# Patient Record
Sex: Female | Born: 1935 | Race: White | Hispanic: No | Marital: Married | Smoking: Former smoker
Health system: Southern US, Community
[De-identification: ages and names within clinical notes are randomized; demographics above are authoritative.]

## PROBLEM LIST (undated history)

## (undated) DIAGNOSIS — A6920 Lyme disease, unspecified: Secondary | ICD-10-CM

## (undated) DIAGNOSIS — M1990 Unspecified osteoarthritis, unspecified site: Secondary | ICD-10-CM

## (undated) DIAGNOSIS — E785 Hyperlipidemia, unspecified: Secondary | ICD-10-CM

## (undated) DIAGNOSIS — M75100 Unspecified rotator cuff tear or rupture of unspecified shoulder, not specified as traumatic: Secondary | ICD-10-CM

## (undated) DIAGNOSIS — I10 Essential (primary) hypertension: Secondary | ICD-10-CM

## (undated) DIAGNOSIS — K649 Unspecified hemorrhoids: Secondary | ICD-10-CM

## (undated) DIAGNOSIS — R55 Syncope and collapse: Secondary | ICD-10-CM

## (undated) DIAGNOSIS — E079 Disorder of thyroid, unspecified: Secondary | ICD-10-CM

## (undated) DIAGNOSIS — R634 Abnormal weight loss: Secondary | ICD-10-CM

## (undated) DIAGNOSIS — N83209 Unspecified ovarian cyst, unspecified side: Secondary | ICD-10-CM

## (undated) HISTORY — DX: Unspecified rotator cuff tear or rupture of unspecified shoulder, not specified as traumatic: M75.100

## (undated) HISTORY — DX: Unspecified osteoarthritis, unspecified site: M19.90

## (undated) HISTORY — PX: TONSILLECTOMY: SUR1361

## (undated) HISTORY — PX: OTHER SURGICAL HISTORY: SHX169

## (undated) HISTORY — DX: Unspecified hemorrhoids: K64.9

## (undated) HISTORY — DX: Syncope and collapse: R55

## (undated) HISTORY — DX: Unspecified ovarian cyst, unspecified side: N83.209

## (undated) HISTORY — DX: Hyperlipidemia, unspecified: E78.5

## (undated) HISTORY — DX: Lyme disease, unspecified: A69.20

## (undated) HISTORY — DX: Disorder of thyroid, unspecified: E07.9

## (undated) HISTORY — PX: KNEE ARTHROSCOPY: SHX127

## (undated) HISTORY — DX: Abnormal weight loss: R63.4

## (undated) HISTORY — DX: Essential (primary) hypertension: I10

---
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
## 1968-06-15 HISTORY — PX: ABDOMINAL HYSTERECTOMY: SHX81

## 1987-06-16 HISTORY — PX: ROTATOR CUFF REPAIR: SHX139

## 1999-04-18 ENCOUNTER — Encounter: Payer: Self-pay | Admitting: Specialist

## 1999-04-18 ENCOUNTER — Encounter: Admission: RE | Admit: 1999-04-18 | Discharge: 1999-04-18 | Payer: Self-pay | Admitting: Specialist

## 2003-06-28 ENCOUNTER — Encounter: Admission: RE | Admit: 2003-06-28 | Discharge: 2003-06-28 | Payer: Self-pay | Admitting: Otolaryngology

## 2003-07-13 ENCOUNTER — Encounter (INDEPENDENT_AMBULATORY_CARE_PROVIDER_SITE_OTHER): Payer: Self-pay | Admitting: *Deleted

## 2003-07-13 ENCOUNTER — Ambulatory Visit (HOSPITAL_COMMUNITY): Admission: RE | Admit: 2003-07-13 | Discharge: 2003-07-13 | Payer: Self-pay | Admitting: Otolaryngology

## 2003-07-13 ENCOUNTER — Ambulatory Visit (HOSPITAL_BASED_OUTPATIENT_CLINIC_OR_DEPARTMENT_OTHER): Admission: RE | Admit: 2003-07-13 | Discharge: 2003-07-13 | Payer: Self-pay | Admitting: Otolaryngology

## 2005-06-21 ENCOUNTER — Emergency Department (HOSPITAL_COMMUNITY): Admission: EM | Admit: 2005-06-21 | Discharge: 2005-06-21 | Payer: Self-pay | Admitting: Emergency Medicine

## 2007-06-29 ENCOUNTER — Emergency Department (HOSPITAL_COMMUNITY): Admission: EM | Admit: 2007-06-29 | Discharge: 2007-06-29 | Payer: Self-pay | Admitting: Emergency Medicine

## 2007-12-15 ENCOUNTER — Ambulatory Visit: Payer: Self-pay | Admitting: Oncology

## 2008-01-12 LAB — CBC WITH DIFFERENTIAL/PLATELET
BASO%: 0.6 % (ref 0.0–2.0)
LYMPH%: 29.7 % (ref 14.0–48.0)
MCHC: 35.1 g/dL (ref 32.0–36.0)
MONO#: 0.4 10*3/uL (ref 0.1–0.9)
MONO%: 8.1 % (ref 0.0–13.0)
Platelets: 204 10*3/uL (ref 145–400)
RBC: 3.61 10*6/uL — ABNORMAL LOW (ref 3.70–5.32)
RDW: 12.3 % (ref 11.3–14.5)
WBC: 5.5 10*3/uL (ref 3.9–10.0)

## 2008-01-12 LAB — COMPREHENSIVE METABOLIC PANEL
Albumin: 4.1 g/dL (ref 3.5–5.2)
Alkaline Phosphatase: 59 U/L (ref 39–117)
BUN: 12 mg/dL (ref 6–23)
Calcium: 9.1 mg/dL (ref 8.4–10.5)
Glucose, Bld: 109 mg/dL — ABNORMAL HIGH (ref 70–99)
Sodium: 137 mEq/L (ref 135–145)
Total Bilirubin: 0.7 mg/dL (ref 0.3–1.2)
Total Protein: 6.4 g/dL (ref 6.0–8.3)

## 2008-01-12 LAB — APTT: aPTT: 27 seconds (ref 24–37)

## 2008-01-12 LAB — PROTHROMBIN TIME: Prothrombin Time: 12.8 seconds (ref 11.6–15.2)

## 2009-05-08 ENCOUNTER — Encounter: Admission: RE | Admit: 2009-05-08 | Discharge: 2009-05-08 | Payer: Self-pay | Admitting: Family Medicine

## 2010-10-17 ENCOUNTER — Encounter (INDEPENDENT_AMBULATORY_CARE_PROVIDER_SITE_OTHER): Payer: Self-pay | Admitting: Surgery

## 2010-10-31 NOTE — Op Note (Signed)
NAME:  TAMEE, BATTIN                          ACCOUNT NO.:  [id number]   MEDICAL RECORD NO.:  [id number]                   PATIENT TYPE:  AMB   LOCATION:  DSC                                  FACILITY:  [facility]   PHYSICIAN:  Kristine Garbe. Ezzard Standing, M.D.         DATE OF BIRTH:  08-25-1928   DATE OF PROCEDURE:  07/13/2003  DATE OF DISCHARGE:                                 OPERATIVE REPORT   PREOPERATIVE DIAGNOSIS:  Left posterior neck mass 2 by 3.5 cm size.   POSTOPERATIVE DIAGNOSIS:  Left posterior neck mass 2 by 3.5 cm size.   OPERATION:  Excision of posterior left neck mass (grossly consistent with  lipoma).   SURGEON:  Kristine Garbe. Ezzard Standing, M.D.   ANESTHESIA:  Local 1% Xylocaine with 1:100,000 epinephrine.   COMPLICATIONS:  None.   BRIEF CLINICAL NOTE:  Anglea Gordner is a 77 year old female who has noted a  nodule in the left posterior neck now for about ten years.  More recently,  she felt like it had gotten a little bit larger and is taken to the  operating room at this time for excision of a left posterior neck mass.  It  measures approximately 2 by 3.5 cm size.   DESCRIPTION OF PROCEDURE:  The patient was brought to the operating room.  Her neck was prepped with Betadine solution and the area of the mass was  injected with Xylocaine with epinephrine for local anesthetic.  The area was  draped out and an incision was made directly over the mass.  Dissection was  carried down through the subcutaneous tissue to the mass which was  consistent with probable lipoma.  This was excised from the deep musculature  and sent to pathology.  Hemostasis was obtained with the cautery and the  defect was closed with 3-0 chromic sutures subcutaneously and running 5-0  nylon on the skin.  Dressing was applied.  The patient tolerated the  procedure well.   DISPOSITION:  Vyla is discharged home.  She was instructed to leave the  dressing dry and intact for 48 hours.  She may then remove  the dressing.  We  will have her follow up in my office in 6-7 days for for recheck, to review  pathology, and have sutures removed.                                               Kristine Garbe. Ezzard Standing, M.D.    CEN/MEDQ  D:  07/13/2003  T:  07/13/2003  Job:  [id number]   cc:   Windle Guard, M.D.  [address]  [place], [place] [address]  Fax: [phone]

## 2010-12-22 ENCOUNTER — Other Ambulatory Visit: Payer: Self-pay | Admitting: Family Medicine

## 2010-12-22 DIAGNOSIS — M545 Low back pain: Secondary | ICD-10-CM

## 2010-12-23 ENCOUNTER — Ambulatory Visit
Admission: RE | Admit: 2010-12-23 | Discharge: 2010-12-23 | Disposition: A | Discharge status: Home / Self Care | Payer: Medicare Other | Source: Ambulatory Visit | Attending: Family Medicine | Admitting: Family Medicine

## 2010-12-23 DIAGNOSIS — M545 Low back pain: Secondary | ICD-10-CM

## 2011-01-20 ENCOUNTER — Emergency Department (HOSPITAL_COMMUNITY)
Admission: EM | Admit: 2011-01-20 | Discharge: 2011-01-21 | Disposition: A | Discharge status: Home / Self Care | Payer: Medicare Other | Attending: Emergency Medicine | Admitting: Emergency Medicine

## 2011-01-20 ENCOUNTER — Emergency Department (HOSPITAL_COMMUNITY): Payer: Medicare Other

## 2011-01-20 DIAGNOSIS — E871 Hypo-osmolality and hyponatremia: Secondary | ICD-10-CM | POA: Insufficient documentation

## 2011-01-20 DIAGNOSIS — I10 Essential (primary) hypertension: Secondary | ICD-10-CM | POA: Insufficient documentation

## 2011-01-20 DIAGNOSIS — R1013 Epigastric pain: Secondary | ICD-10-CM | POA: Insufficient documentation

## 2011-01-20 DIAGNOSIS — K2970 Gastritis, unspecified, without bleeding: Secondary | ICD-10-CM | POA: Insufficient documentation

## 2011-01-20 DIAGNOSIS — E039 Hypothyroidism, unspecified: Secondary | ICD-10-CM | POA: Insufficient documentation

## 2011-01-20 LAB — COMPREHENSIVE METABOLIC PANEL
Albumin: 4 g/dL (ref 3.5–5.2)
Alkaline Phosphatase: 66 U/L (ref 39–117)
BUN: 16 mg/dL (ref 6–23)
Calcium: 10.1 mg/dL (ref 8.4–10.5)
Creatinine, Ser: 0.85 mg/dL (ref 0.50–1.10)
GFR calc Af Amer: >60 mL/min (ref >60)
Glucose, Bld: 83 mg/dL (ref 70–99)
Total Protein: 7.3 g/dL (ref 6.0–8.3)

## 2011-01-20 LAB — URINALYSIS, ROUTINE W REFLEX MICROSCOPIC
Glucose, UA: NEGATIVE mg/dL
Leukocytes, UA: NEGATIVE
Nitrite: NEGATIVE
Specific Gravity, Urine: 1.02 (ref 1.005–1.030)
pH: 5.5 (ref 5.0–8.0)

## 2011-01-20 LAB — DIFFERENTIAL
Basophils Relative: 0 % (ref 0–1)
Eosinophils Absolute: 0 10*3/uL (ref 0.0–0.7)
Lymphs Abs: 1.6 10*3/uL (ref 0.7–4.0)
Monocytes Relative: 9 % (ref 3–12)
Neutro Abs: 5.1 10*3/uL (ref 1.7–7.7)
Neutrophils Relative %: 69 % (ref 43–77)

## 2011-01-20 LAB — CBC
Hemoglobin: 13.7 g/dL (ref 12.0–15.0)
MCH: 32 pg (ref 26.0–34.0)
Platelets: 224 10*3/uL (ref 150–400)
RBC: 4.28 MIL/uL (ref 3.87–5.11)
WBC: 7.4 10*3/uL (ref 4.0–10.5)

## 2011-01-20 LAB — URINE MICROSCOPIC-ADD ON

## 2011-01-20 LAB — LACTIC ACID, PLASMA: Lactic Acid, Venous: 1 mmol/L (ref 0.5–2.2)

## 2011-01-20 LAB — TROPONIN I: Troponin I: <0.3 ng/mL (ref <0.30)

## 2011-01-20 LAB — LIPASE, BLOOD: Lipase: 26 U/L (ref 11–59)

## 2011-01-23 ENCOUNTER — Other Ambulatory Visit: Payer: Self-pay | Admitting: Gastroenterology

## 2011-01-23 DIAGNOSIS — R1013 Epigastric pain: Secondary | ICD-10-CM

## 2011-01-23 DIAGNOSIS — R110 Nausea: Secondary | ICD-10-CM

## 2011-02-06 ENCOUNTER — Other Ambulatory Visit: Payer: Self-pay | Admitting: Gastroenterology

## 2011-02-06 ENCOUNTER — Ambulatory Visit
Admission: RE | Admit: 2011-02-06 | Discharge: 2011-02-06 | Disposition: A | Discharge status: Home / Self Care | Payer: Medicare Other | Source: Ambulatory Visit | Attending: Gastroenterology | Admitting: Gastroenterology

## 2011-02-06 DIAGNOSIS — R110 Nausea: Secondary | ICD-10-CM

## 2011-02-06 DIAGNOSIS — R1013 Epigastric pain: Secondary | ICD-10-CM

## 2011-02-06 DIAGNOSIS — K317 Polyp of stomach and duodenum: Secondary | ICD-10-CM

## 2011-02-10 ENCOUNTER — Ambulatory Visit
Admission: RE | Admit: 2011-02-10 | Discharge: 2011-02-10 | Disposition: A | Discharge status: Home / Self Care | Payer: Medicare Other | Source: Ambulatory Visit | Attending: Gastroenterology | Admitting: Gastroenterology

## 2011-02-10 DIAGNOSIS — K317 Polyp of stomach and duodenum: Secondary | ICD-10-CM

## 2011-02-10 MED ORDER — IOHEXOL 300 MG/ML  SOLN: 100.0000 mL | Freq: Once | INTRAMUSCULAR | Status: AC | PRN

## 2011-02-12 ENCOUNTER — Encounter (INDEPENDENT_AMBULATORY_CARE_PROVIDER_SITE_OTHER): Payer: Self-pay | Admitting: General Surgery

## 2011-02-12 ENCOUNTER — Ambulatory Visit (INDEPENDENT_AMBULATORY_CARE_PROVIDER_SITE_OTHER): Payer: Medicare Other | Admitting: General Surgery

## 2011-02-12 VITALS — BP 128/70 | HR 68 | Temp 97.4°F | Ht 66.0 in | Wt 132.2 lb

## 2011-02-12 DIAGNOSIS — R630 Anorexia: Secondary | ICD-10-CM

## 2011-02-12 DIAGNOSIS — R1013 Epigastric pain: Secondary | ICD-10-CM

## 2011-02-12 DIAGNOSIS — M549 Dorsalgia, unspecified: Secondary | ICD-10-CM

## 2011-02-12 NOTE — Progress Notes (Signed)
Subjective:   Loss of appetite  Patient ID: Morgan Weaver, female   DOB: 06-Jun-1929, 77 y.o.   MRN: [id number]  HPI The patient is a pleasant 77 year old female patient of Dr. Jeannetta Nap referred through Dr. Randa Evens for evaluation for abdominal pain, back pain, loss of appetite. She states that 3-4 weeks ago she initially developed back pain. She describes pain in her mid back somewhat more on the left than the right. This was her only complaint initially. She saw Dr. Jeannetta Nap. She was evaluated for a kidney problem with an x-ray and lab work and this was all unremarkable. Then approximately 2 weeks ago she developed fairly severe acute epigastric pain and presented to the emergency room. This was her first and only episode of abdominal pain. Lab work including CBC lipase seem at a cardiac workup were all unremarkable. She was given what sounds like a GI cocktail and her abdominal pain resolved.  The patient however continues to have persistent lack of appetite. She eats small amounts but just does not feel hungry. She is having weight loss. She has lost about 15 pounds. She denies pain when she eats or nausea or vomiting. She has had no further anterior abdominal pain since her ER visit. She has been somewhat constipated. Her main complaint other than lack of appetite is now persistent pain in her left back beneath the rib cage. This is more positional than anything else and is unaffected by eating or not eating.  The patient has seen Dr. Randa Evens for evaluation. She underwent EGD. This showed localized mild inflammation in the gastric antrum. The patient had been on Loxitane #4 knee pain which was stopped. Biopsies showed nonspecific inflammation. He also noted an apparent small approximately 1 cm pedunculated polyp in the duodenum which I do not believe was removed.   CT scan of the abdomen and pelvis was obtained on 828. This shows multiple hepatic cysts. Also seen was a 2.2 cm rounded fat density mass in the  second portion of the duodenum with no evidence of obstruction. This is felt most likely to be a lipoma.  Ultrasound of the abdomen was performed on 824 which showed sludge within the gallbladder lumen with no discrete gallstones. There was no evidence of cholecystitis. Review of Systems  Constitutional: Positive for appetite change and unexpected weight change.  HENT: Negative.   Eyes: Negative.   Respiratory: Negative.   Cardiovascular: Negative.   Gastrointestinal: Positive for constipation. Negative for nausea, blood in stool and abdominal distention.  Musculoskeletal: Positive for back pain and arthralgias.       Objective:   Physical Exam General: Thin elderly white female in no distress Skin: Warm and dry without rash or infection HEENT: No palpable masses or thyromegaly. Sclera nonicteric. Lymph nodes: No palpable cervical or supraclavicular or inguinal lymph nodes Lungs: Clear without wheezing or increased work of breathing Cardiovascular: Regular rate and rhythm without murmur. No edema. Abdomen: No CVA tenderness. There is mild left upper quadrant tenderness with no guarding. No right upper quadrant tenderness. No discernible masses or organomegaly. Extremities: No joint swelling or deformity Neurologic: Alert and fully oriented. Gait normal.    Assessment:     77 year old female with a somewhat confusing illness consisting of initially back pain than one episode of acute epigastric pain that has resolved and not recurred. Her main complaint now is lack of appetite without other GI symptoms and persistent discomfort in the left side of her back. She does have some evidence of  gastritis on endoscopy possibly secondary to nonsteroidal anti-inflammatories. She has an apparent lipoma in her duodenum but I do not see how this could be contributing to her symptoms. She has sludge in her gallbladder but no definite stones and she has no ongoing abdominal pain or tenderness in the  right upper quadrant.  At this point I think she needs to be treated for gastritis and observed. She has an appointment to see Dr. Randa Evens in 2 weeks. I have started her on Carafate and protonix. If she does not respond to symptomatic management we could further evaluate her gallbladder with a HIDA scan but her symptoms and exam are very atypical.    Plan:     Patient is to followup with Dr. Randa Evens in 2 weeks. I will discuss with him at that time how she is doing. Consider further gallbladder workup. I do not see a surgical indication at this time.

## 2011-02-12 NOTE — Patient Instructions (Signed)
Keep appointment with Dr. Randa Evens. A few or not improving at that time we could consider further evaluation of her gallbladder with a HIDA scan. Call for questions.

## 2011-03-05 LAB — COMPREHENSIVE METABOLIC PANEL
ALT: 11
AST: 17
Albumin: 3.4 — ABNORMAL LOW
CO2: 26
Calcium: 8.8
Chloride: 107
GFR calc Af Amer: >60
GFR calc non Af Amer: 57 — ABNORMAL LOW
Sodium: 139
Total Bilirubin: 0.9

## 2011-03-05 LAB — DIFFERENTIAL
Eosinophils Absolute: 0.1
Eosinophils Relative: 1
Lymphs Abs: 1.3
Monocytes Absolute: 0.4

## 2011-03-05 LAB — CBC
RBC: 3.81 — ABNORMAL LOW
WBC: 4.5

## 2011-03-05 LAB — URINALYSIS, ROUTINE W REFLEX MICROSCOPIC
Nitrite: NEGATIVE
Specific Gravity, Urine: 1.008
Urobilinogen, UA: 0.2

## 2011-03-31 ENCOUNTER — Encounter (INDEPENDENT_AMBULATORY_CARE_PROVIDER_SITE_OTHER): Payer: Self-pay | Admitting: Surgery

## 2011-04-14 ENCOUNTER — Encounter (INDEPENDENT_AMBULATORY_CARE_PROVIDER_SITE_OTHER): Payer: Self-pay | Admitting: Surgery

## 2011-04-16 ENCOUNTER — Encounter (INDEPENDENT_AMBULATORY_CARE_PROVIDER_SITE_OTHER): Payer: Self-pay | Admitting: Surgery

## 2011-04-16 ENCOUNTER — Ambulatory Visit (INDEPENDENT_AMBULATORY_CARE_PROVIDER_SITE_OTHER): Payer: Medicare Other | Admitting: Surgery

## 2011-04-16 VITALS — BP 125/81 | HR 71 | Temp 97.1°F | Resp 14 | Ht 64.5 in | Wt 132.2 lb

## 2011-04-16 DIAGNOSIS — L290 Pruritus ani: Secondary | ICD-10-CM | POA: Insufficient documentation

## 2011-04-16 NOTE — Patient Instructions (Signed)
Use baby wipes after bowel movements to clean yourself.  Then get the area as dry as possible, then apply a small amount of 1% hydrocortisone ointment to the itchy area.    Use a daily fiber supplement to keep your bowel movements regular.  Follow-up as needed

## 2011-04-16 NOTE — Progress Notes (Signed)
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  Recently, she has had some digestive issues related to her back and the pain medication.  Her bowel movements continue occur daily, but she has developed a lot of burning and itching around her anus.  No gross blood.  She returns today for further evaluation.  On rectal examination, she has no significant external hemorrhoidal disease.  No sign of fissure or abscess. She has some excoriation in the posterior midline with obvious skin irritation.  This is the area of her symptoms.  Sphincter tone is good.  Imp:  Pruritus ani  Recs:  Fiber supplement to regulate bowel movements. Use baby wipes to clean herself after bowel movements. Dry the perianal skin thoroughly, then apply a small amount of 1% hydrocortisone ointment to the affected area.  Follow-up PRN

## 2011-08-27 ENCOUNTER — Encounter: Payer: Self-pay | Admitting: *Deleted

## 2012-11-16 ENCOUNTER — Encounter (HOSPITAL_COMMUNITY): Payer: Self-pay | Admitting: *Deleted

## 2012-11-16 ENCOUNTER — Inpatient Hospital Stay (HOSPITAL_COMMUNITY)
Admission: EM | Admit: 2012-11-16 | Discharge: 2012-11-18 | DRG: 312 | Disposition: A | Discharge status: Home Health | Payer: Medicare Other | Attending: Internal Medicine | Admitting: Internal Medicine

## 2012-11-16 ENCOUNTER — Emergency Department (HOSPITAL_COMMUNITY): Payer: Medicare Other

## 2012-11-16 DIAGNOSIS — E039 Hypothyroidism, unspecified: Secondary | ICD-10-CM | POA: Diagnosis present

## 2012-11-16 DIAGNOSIS — D61818 Other pancytopenia: Secondary | ICD-10-CM | POA: Diagnosis present

## 2012-11-16 DIAGNOSIS — R634 Abnormal weight loss: Secondary | ICD-10-CM | POA: Diagnosis present

## 2012-11-16 DIAGNOSIS — Y92009 Unspecified place in unspecified non-institutional (private) residence as the place of occurrence of the external cause: Secondary | ICD-10-CM

## 2012-11-16 DIAGNOSIS — N390 Urinary tract infection, site not specified: Secondary | ICD-10-CM | POA: Diagnosis present

## 2012-11-16 DIAGNOSIS — R55 Syncope and collapse: Principal | ICD-10-CM | POA: Diagnosis present

## 2012-11-16 DIAGNOSIS — R296 Repeated falls: Secondary | ICD-10-CM | POA: Diagnosis present

## 2012-11-16 DIAGNOSIS — E876 Hypokalemia: Secondary | ICD-10-CM | POA: Diagnosis present

## 2012-11-16 DIAGNOSIS — D72819 Decreased white blood cell count, unspecified: Secondary | ICD-10-CM | POA: Diagnosis present

## 2012-11-16 DIAGNOSIS — Z9089 Acquired absence of other organs: Secondary | ICD-10-CM

## 2012-11-16 DIAGNOSIS — Z881 Allergy status to other antibiotic agents status: Secondary | ICD-10-CM

## 2012-11-16 DIAGNOSIS — I10 Essential (primary) hypertension: Secondary | ICD-10-CM | POA: Diagnosis present

## 2012-11-16 DIAGNOSIS — Z882 Allergy status to sulfonamides status: Secondary | ICD-10-CM

## 2012-11-16 DIAGNOSIS — S0100XA Unspecified open wound of scalp, initial encounter: Secondary | ICD-10-CM | POA: Diagnosis present

## 2012-11-16 DIAGNOSIS — Z8249 Family history of ischemic heart disease and other diseases of the circulatory system: Secondary | ICD-10-CM

## 2012-11-16 DIAGNOSIS — Z87891 Personal history of nicotine dependence: Secondary | ICD-10-CM

## 2012-11-16 DIAGNOSIS — Z79899 Other long term (current) drug therapy: Secondary | ICD-10-CM

## 2012-11-16 DIAGNOSIS — E785 Hyperlipidemia, unspecified: Secondary | ICD-10-CM | POA: Diagnosis present

## 2012-11-16 DIAGNOSIS — D696 Thrombocytopenia, unspecified: Secondary | ICD-10-CM | POA: Diagnosis present

## 2012-11-16 DIAGNOSIS — R21 Rash and other nonspecific skin eruption: Secondary | ICD-10-CM | POA: Diagnosis present

## 2012-11-16 LAB — CBC
MCHC: 35.6 g/dL (ref 30.0–36.0)
MCV: 92.7 fL (ref 78.0–100.0)
Platelets: 83 10*3/uL — ABNORMAL LOW (ref 150–400)
RDW: 12.8 % (ref 11.5–15.5)
WBC: 3.3 10*3/uL — ABNORMAL LOW (ref 4.0–10.5)

## 2012-11-16 LAB — DIFFERENTIAL
Basophils Absolute: 0 10*3/uL (ref 0.0–0.1)
Basophils Relative: 1 % (ref 0–1)
Eosinophils Relative: 0 % (ref 0–5)
Lymphocytes Relative: 13 % (ref 12–46)
Neutro Abs: 2.5 10*3/uL (ref 1.7–7.7)

## 2012-11-16 LAB — BASIC METABOLIC PANEL
BUN: 14 mg/dL (ref 6–23)
Calcium: 7.6 mg/dL — ABNORMAL LOW (ref 8.4–10.5)
Chloride: 100 mEq/L (ref 96–112)
Creatinine, Ser: 1.09 mg/dL (ref 0.50–1.10)
GFR calc Af Amer: 53 mL/min — ABNORMAL LOW (ref >90)
GFR calc non Af Amer: 46 mL/min — ABNORMAL LOW (ref >90)

## 2012-11-16 LAB — URINALYSIS, ROUTINE W REFLEX MICROSCOPIC
Ketones, ur: 15 mg/dL — AB
Nitrite: NEGATIVE
Protein, ur: 30 mg/dL — AB
Urobilinogen, UA: 1 mg/dL (ref 0.0–1.0)

## 2012-11-16 LAB — TECHNOLOGIST SMEAR REVIEW: Tech Review: INCREASED

## 2012-11-16 LAB — MAGNESIUM: Magnesium: 1.7 mg/dL (ref 1.5–2.5)

## 2012-11-16 MED ORDER — POTASSIUM CHLORIDE CRYS ER 20 MEQ PO TBCR
40.0000 meq | EXTENDED_RELEASE_TABLET | Freq: Once | ORAL | Status: AC
  Administered 2012-11-16: 40 meq via ORAL
  Filled 2012-11-16: qty 2

## 2012-11-16 MED ORDER — LEVOTHYROXINE SODIUM 125 MCG PO TABS
125.0000 ug | ORAL_TABLET | Freq: Every day | ORAL | Status: DC
  Administered 2012-11-17 – 2012-11-18 (×2): 125 ug via ORAL
  Filled 2012-11-16 (×4): qty 1

## 2012-11-16 MED ORDER — SODIUM CHLORIDE 0.9 % IV SOLN
INTRAVENOUS | Status: DC
  Administered 2012-11-16 – 2012-11-17 (×2): via INTRAVENOUS

## 2012-11-16 MED ORDER — DEXTROSE 5 % IV SOLN
1.0000 g | INTRAVENOUS | Status: DC
  Administered 2012-11-16 – 2012-11-17 (×2): 1 g via INTRAVENOUS
  Filled 2012-11-16 (×2): qty 10

## 2012-11-16 MED ORDER — ONDANSETRON HCL 4 MG PO TABS: 4.0000 mg | ORAL_TABLET | Freq: Four times a day (QID) | ORAL | Status: DC | PRN

## 2012-11-16 MED ORDER — DIPHENHYDRAMINE HCL 25 MG PO CAPS: 25.0000 mg | ORAL_CAPSULE | Freq: Four times a day (QID) | ORAL | Status: DC | PRN

## 2012-11-16 MED ORDER — ONDANSETRON HCL 4 MG/2ML IJ SOLN: 4.0000 mg | Freq: Four times a day (QID) | INTRAMUSCULAR | Status: DC | PRN

## 2012-11-16 MED ORDER — SODIUM CHLORIDE 0.9 % IJ SOLN
3.0000 mL | Freq: Two times a day (BID) | INTRAMUSCULAR | Status: DC
  Administered 2012-11-16 – 2012-11-18 (×4): 3 mL via INTRAVENOUS

## 2012-11-16 MED ORDER — SODIUM CHLORIDE 0.9 % IV BOLUS (SEPSIS)
500.0000 mL | Freq: Once | INTRAVENOUS | Status: AC
  Administered 2012-11-16: 500 mL via INTRAVENOUS

## 2012-11-16 MED ORDER — ENOXAPARIN SODIUM 40 MG/0.4ML ~~LOC~~ SOLN
40.0000 mg | SUBCUTANEOUS | Status: DC
  Administered 2012-11-16: 40 mg via SUBCUTANEOUS
  Filled 2012-11-16 (×2): qty 0.4

## 2012-11-16 NOTE — Progress Notes (Signed)
Per Dr. Shirlee Latch, patient is NOT being admitted for CVA workup.

## 2012-11-16 NOTE — H&P (Signed)
Hospital Admission Note Date: 11/16/2012  Patient name: Morgan Weaver Medical record number: [id number] Date of birth: [birth year] Age: 79 y.o. Gender: female PCP: Leonard Downing, MD  Medical Service: Internal Medicine  Attending physician: Dr. Marinda Elk     1st Contact:[place] (11/16/12 only), Sissy Hoff MD [phone], [phone] 2nd Contact: Dr. Doug Sou [phone] After 5 pm or weekends: 1st Contact: Pager: [phone] 2nd Contact: Pager: [phone]  Chief Complaint: syncope   History of Present Illness: 79 year old female past medical history of hypertension, dyslipidemia, hypothyroidism.  She presented to emergency department 11/16/12 with history of two episodes of syncope over the last 2 days. Both episodes the patient lost consciousness and states she was trying to stand up after using the restroom and after using the restroom she had loss of consciousness and ended up on the floor.  On 11/15/12 she went to the local fire department who examined her and she was supposed to increase her oral intake.  Yesterday's syncopal episode was associated with nausea and decreased oral intake.  On 11/16/12 her son Pearline Cables found her on the bathroom floor where she had been out for about approximately at least 5 minutes.  He applied a wet towel to her face.  She also hit her head at some point and had a right scalp laceration.  Both sons state the patient is independent at home is is not acting differently after her fall.  Review of symptoms positive for decreased appetite 6/3 and 6/4, wound to right scalp from fall, fall, resolved nausea on 11/15/12.  Negative for palpitations, chest pain, orthopnea, shortness of breath, vomiting, abdominal pain/diarrhea, dysuria, blood in urine or stool, dizziness or lightheadedness, postural lightheadedness, vertigo,bladder or bowel incontinence.  She has had a rash x 1 week which itches and started on her right forearm.  Rash stated as a bump on her right forearm and now is starting to  spread on both forearms.  She denies history of tick bites and has 1 indoor cat and 5 outdoors cats.    Meds: No current facility-administered medications for this encounter.   Current Outpatient Prescriptions  Medication Sig Dispense Refill  . hydrochlorothiazide 50 MG tablet Take 25 mg by mouth daily.       Marland Kitchen levothyroxine (SYNTHROID, LEVOTHROID) 125 MCG tablet Take 125 mcg by mouth daily before breakfast. Skip 7th day dose      Patient states shes taking 25 mg daily HCTZ  Allergies: Allergies as of 11/16/2012 - Review Complete 11/16/2012  Allergen Reaction Noted  . Mobic (meloxicam)  11/16/2012  . Sulfa drugs cross reactors  10/17/2010  . Tetracyclines & related Swelling 10/17/2010   Past Medical History  Diagnosis Date  . Torn rotator cuff   . Ovarian cyst   . Lyme disease   . Hypertension   . Hemorrhoid   . Arthritis   . Weight decrease   . Hyperlipidemia   . Thyroid disease     hypothyroidism   Past Surgical History  Procedure Laterality Date  . Appendectomy  1952  . Rotator cuff repair  1989    left  . Oophorectomy  1955    removed due to the ovary having gangrene  . Abdominal hysterectomy  1970  . Knee arthroscopy    . Tonsillectomy    . Other surgical history      left foot surgery  . Other surgical history      right foot surgery   Family History  Problem Relation Age of Onset  . Heart disease  Father     heart attack  . Hypertension Father   . Other Son     brain tumor    History   Social History  . Marital Status: Married    Spouse Name: N/A    Number of Children: N/A  . Years of Education: N/A   Occupational History  . Not on file.   Social History Main Topics  . Smoking status: Former Research scientist (life sciences)  . Smokeless tobacco: Not on file     Comment: quit-1977  . Alcohol Use: 8.4 oz/week    14 Glasses of wine per week     Comment: BEFORE DINNER  . Drug Use: No  . Sexually Active:    Other Topics Concern  . 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           Review of Systems: General: denies fever/chills, per patient normal appetite though son reports she felt nauseated yesterday and did not eat much 6/3 and 6/4  HEENT: +wound to right scalp from fall Cardiac: denies palpitations, chest pain, orthopnea  Pulm: denies shortness of breath Abd: +nauseated (11/15/12), denies vomiting, denies abdominal pain/diarrhea, denies dysuria, denies blood in urine or stool  Ext: denies lower extremity edema  Neuro: +syncope x 2 episodes 6/3 and 6/4 while standing up after using the restroom, +fall, denies dizziness or lightheadedness, denies postural lightheadedness, denies vertigo, denies bladder or bowel incontinence  Skin: +rash x 1 week which itches  Other: denies history of tick bites, Patient has 1 indoor cat and 5 outdoors cats    Physical Exam: VS HR 65, 18, 114/64 (72) as low as 97/52 (66) Blood pressure 114/62, pulse 67, temperature 98.6 F (37 C), temperature source Oral, resp. rate 15, SpO2 98.00%. Vitals reviewed. General: resting in bed, NAD, alert and oriented to place, date of birth, not year thinks it is [birth year] or [birth year], not month, oriented to Software engineer of [place] states, thin body habitus  HEENT: perrl b/l, no scleral icterus, right scalp with laceration due to fall Cardiac: RRR, no rubs, murmurs or gallops Pulm: clear to auscultation bilaterally, no wheezes, rales, or rhonchi Abd: soft, nontender, nondistended, BS present Ext: warm and well perfused, no pedal edema Neuro: alert and oriented X3, grossly neurologically intact, moving all 4 extremities  Skin: macular and papular erythematous annular lesions to forearms which are pruritic and some with a hypopigmented center.  Some areas of rash are coalescing.    Lab  results: Basic Metabolic Panel:  Recent Labs  11/16/12 1222  NA 132*  K 3.2*  CL 100  CO2 23  GLUCOSE 102*  BUN 14  CREATININE 1.09  CALCIUM 7.6*   CBC:  Recent Labs  11/16/12 1222  WBC 3.3*  HGB 12.6  HCT 35.4*  MCV 92.7  PLT 83*   Urinalysis:  Recent Labs  11/16/12 1228  COLORURINE AMBER*  LABSPEC 1.028  PHURINE 5.5  GLUCOSEU NEGATIVE  HGBUR MODERATE*  BILIRUBINUR MODERATE*  KETONESUR 15*  PROTEINUR 30*  UROBILINOGEN 1.0  NITRITE NEGATIVE  LEUKOCYTESUR TRACE*  moderate hemoglobin  3-6 WBC 0-2 RBC  Few squamous epithelial cells  Many bacteria   Misc. Labs: Urine culture  TSH CBC Magnesium CMET Blood cultures  Imaging results:  Ct Head Wo Contrast  11/16/2012   *RADIOLOGY REPORT*  Clinical Data: Syncope.  CT HEAD WITHOUT CONTRAST  Technique:  Contiguous axial images were obtained from the base of the skull through the vertex without contrast.  Comparison: Brain MRI 05/08/2009.  Findings: There is cortical atrophy and chronic microvascular ischemic change.  No evidence of acute abnormality including infarct, hemorrhage, mass lesion, mass effect, midline shift or abnormal extra-axial fluid collection.  No hydrocephalus or pneumocephalus.  The calvarium is intact.  IMPRESSION: No acute finding.  Atrophy and chronic microvascular ischemic change.   Original Report Authenticated By: Orlean Patten, M.D.    Other results: EKG:NSR rate 69, LAD, normal intervals, no acute ST/T changes, poor R wave progression, no LVH  Assessment & Plan by Problem: 79 year old female past medical history of hypertension, dyslipidemia, hypothyroidism who presents after two episodes of syncope at home.   1. Syncope -With LOC for unclear duration both episodes happened after patient was standing up post voiding.  She has not felt well over the past couple of days and has had decreased oral intake.  Etiology could be neurocardiogenic (i.e vasovagal), orthostatic hypotension  due to decreased oral intake with use of hydrochlorothiazide, arrhythmia.  Appears less likely neurologic as no seizure like activity was reported and the patient did not lose bowel or bladder incontinence. She was not hypoglycemia on admission, hypoxic or anemic.  Head CT negative for any acute intracranial abnormality and notes atrophy and chronic microvascular changes. EKG not concerning.  -will monitor via telemetry.  Will call for cardiology consult for syncope -pending 2D echo, troponin x 3, check FOBT -will hydrate with IVF NS 100 cc/hr -will check orthostatic VS  2. Suspected UTI -Denies dysuria.  Will treat with Rx Rocephin and d/c if urine culture negative.   3. Hypokalemia  -3.2 on admission. Given Kdur 40 meQ x 1.  -will check Mag, Trend BMET  4. Leukopenia andThrombocytopenia  -No previous history of low WBCs or low platelets.  Etiology could be infectious (in the history it states patient has a history of lyme disease) or related to sulfa containing drugs (i.e HCTZ), malignancy must be in the differential  -will order a peripheral smear review  -f/u CBC with diff  -consider malignancy work up  5. Rash -Concern for erythema multiforme with distribution and appearance of rash -Will hold HCTZ for now as it can be a cause of erythema multiforme  6. History of HTN -Monitor VS -On HCTZ 25 mg at home.  Will hold HCTZ for now as it can be a cause of erythema multiforme and patient has a rash -if significant hypertensive will need an agent for blood pressure control added.   7. History of hypothyroidism -resumed Synthroid 125 mcg daily  -pending tsh  8. F/E/N -NS 100 cc/hr  -CMET, Mag in the am -regular diet   9. DVT px  -Lovenox   Code Status: full code   Dispo: Disposition is deferred at this time, awaiting improvement of current medical problems. Anticipated discharge in approximately 2-3 day(s).   The patient does have a current PCP Leonard Downing, MD),  therefore will not requiring [facility]PC follow-up after discharge.   The patient does not have transportation limitations that hinder transportation to clinic appointments.  SignedCresenciano Genre [phone] 11/16/2012, 3:30 PM

## 2012-11-16 NOTE — ED Notes (Signed)
Pt returned from radiology.

## 2012-11-16 NOTE — ED Notes (Signed)
Pt was  Found by EMS to be pale and nauseated. Pt received 4mg  of zofran with no relief.

## 2012-11-16 NOTE — ED Notes (Addendum)
Per EMS- pt has had several syncopal episodes over the last 3 days. Pt has had N/V as well. Pt was hypotensive 100/70 initially. Pt was given 100 ml NS en route. Pt on 2L for sats in lower 90%. Pt was found in the restroom this morning by family. Hit head on rt side this morning.

## 2012-11-16 NOTE — H&P (Signed)
Internal Medicine Attending Admission Note Date: 11/16/2012  Patient name: Morgan Weaver Medical record number: [id number] Date of birth: 11-Feb-1929 Age: 79 y.o. Gender: female  I saw and evaluated the patient. I reviewed the resident's note and I agree with the resident's findings and plan as documented in the resident's note, with the following additional comments.  Chief Complaint(s): Syncope  History - key components related to admission: Patient is a 79 year old woman with history of hypertension, hyperlipidemia, hypothyroidism, and other problems as outlined in the medical history admitted with complaint of loss of consciousness x2 episodes over the past 2 days.  The first episode occurred after she stood up from the toilet and she lost consciousness and fell.  His second episode occurred when she was standing at the sink.  She denies any premonitory symptoms.  She also denies fever, chills, sweats, headache, chest pain, shortness of breath, palpitations, abdominal pain, nausea, vomiting, diarrhea, bright red blood per rectum, or lower extremity edema.  She reports that she has had normal oral intake recently.  She has no prior history of syncope or seizures.  She is on no new medications.   Physical Exam - key components related to admission:  Filed Vitals:   11/16/12 1530 11/16/12 1631 11/16/12 1634 11/16/12 1636  BP: 118/56 131/63 147/77 127/75  Pulse:  54 62 68  Temp:  98.5 F (36.9 C) 98.5 F (36.9 C) 98.5 F (36.9 C)  TempSrc:  Oral Oral Oral  Resp:  18 14 18   SpO2: 96% 100% 100% 100%    General: Alert, no distress HEENT: Wound on right scalp secondary to fall Lungs: Clear Heart: Regular; S1-S2, no S3, no S4, no murmurs Abdomen: Bowel sounds present, soft, nontender Extremities: No edema Neurological: Alert and oriented; cranial nerves II through XII intact; strength normal.  Lab results:   Basic Metabolic Panel:  Recent Labs  46/96/29 1222  NA 132*  K 3.2*   CL 100  CO2 23  GLUCOSE 102*  BUN 14  CREATININE 1.09  CALCIUM 7.6*    CBC:  Recent Labs  11/16/12 1222  WBC 3.3*  NEUTROABS 2.5  HGB 12.6  HCT 35.4*  MCV 92.7  PLT 83*    Urinalysis    Component Value Date/Time   COLORURINE AMBER* 11/16/2012 1228   APPEARANCEUR CLOUDY* 11/16/2012 1228   LABSPEC 1.028 11/16/2012 1228   PHURINE 5.5 11/16/2012 1228   GLUCOSEU NEGATIVE 11/16/2012 1228   HGBUR MODERATE* 11/16/2012 1228   BILIRUBINUR MODERATE* 11/16/2012 1228   KETONESUR 15* 11/16/2012 1228   PROTEINUR 30* 11/16/2012 1228   UROBILINOGEN 1.0 11/16/2012 1228   NITRITE NEGATIVE 11/16/2012 1228   LEUKOCYTESUR TRACE* 11/16/2012 1228    Urine microscopic: WBCs 3-6, RBCs 0-2, squamous epithelial few, bacteria many.  Imaging results:  Ct Head Wo Contrast  11/16/2012   *RADIOLOGY REPORT*  Clinical Data: Syncope.  CT HEAD WITHOUT CONTRAST  Technique:  Contiguous axial images were obtained from the base of the skull through the vertex without contrast.  Comparison: Brain MRI 05/08/2009.  Findings: There is cortical atrophy and chronic microvascular ischemic change.  No evidence of acute abnormality including infarct, hemorrhage, mass lesion, mass effect, midline shift or abnormal extra-axial fluid collection.  No hydrocephalus or pneumocephalus.  The calvarium is intact.  IMPRESSION: No acute finding.  Atrophy and chronic microvascular ischemic change.   Original Report Authenticated By: Holley Dexter, M.D.    Assessment & Plan by Problem:  1.  Syncope.  Patient reports 2 episodes  of syncope over the last 2 days, with no premonitory symptoms.  She has no history of syncope or seizures.  Her presentation is concerning for an arrhythmic cause.  The plan is monitor; rule out MI with serial enzymes and EKGs; 2-D echocardiogram; cardiology consult.  2. Hypokalemia.  Plan is to supplement potassium and follow.  3.  Hypothyroidism.  Plan is check TSH and continue current dose of levothyroxine pending the  results.  4.  Other problems and plans as per the resident physician's note.

## 2012-11-16 NOTE — ED Provider Notes (Signed)
History     CSN: [id number]  Arrival date & time 11/16/12  1037   First MD Initiated Contact with Patient 11/16/12 1041      Chief Complaint  Patient presents with  . Loss of Consciousness    (Consider location/radiation/quality/duration/timing/severity/associated sxs/prior treatment) HPI Comments: 79 year old female presents to emergency department with 2 of her sons after having multiple syncopal episodes over the past 2 days. Patient states yesterday she got up out of bed to go to the bathroom, sat on the toilet and the next thing she knew she was on the floor. Her son came home from the store and found her on the floor, completely alert and oriented. She then went back into bed, woke up later that day, went to sit in a chair and shortly after was slouched over, her son came and saw and woke her up. At that time she felt nauseous and tired. States she just did not feel well. This morning when she woke up, she went to use the restroom, sat on the toilet, fell the floor in head. Her son and found her on the floor, she is not sure how long she was on the floor 4. Son states she was completely alert and oriented at the time. She had one episode of vomiting since, and currently states she just does not feel well. Denies confusion, lightheadedness, dizziness, chest pain, shortness of breath, fever, chills, urinary changes, bowel changes. Both son state patient is acting normal other than appearing tired.  The history is provided by the patient and a relative.    Past Medical History  Diagnosis Date  . Torn rotator cuff   . Ovarian cyst   . Lyme disease   . Hypertension   . Hemorrhoid   . Arthritis   . Weight decrease   . Hyperlipidemia   . Thyroid disease     hypothyroidism    Past Surgical History  Procedure Laterality Date  . Appendectomy  1952  . Rotator cuff repair  1989    left  . Oophorectomy  1955    removed due to the ovary having gangrene  . Abdominal hysterectomy  1970    . Knee arthroscopy    . Tonsillectomy      Family History  Problem Relation Age of Onset  . Heart disease Father     heart attack  . Hypertension Father     History  Substance Use Topics  . Smoking status: Former Games developer  . Smokeless tobacco: Not on file     Comment: quit-1977  . Alcohol Use: 8.4 oz/week    14 Glasses of wine per week     Comment: BEFORE DINNER    OB History   Grav Para Term Preterm Abortions TAB SAB Ect Mult Living                  Review of Systems  Constitutional: Positive for fever. Negative for chills and fatigue.  HENT: Negative for neck pain and neck stiffness.   Respiratory: Negative for shortness of breath.   Cardiovascular: Negative for chest pain.  Gastrointestinal: Positive for nausea and vomiting. Negative for abdominal pain.  Genitourinary: Negative for dysuria, urgency, frequency and difficulty urinating.  Musculoskeletal: Negative for back pain.  Skin: Positive for wound.  Neurological: Positive for syncope and weakness. Negative for dizziness and light-headedness.  Psychiatric/Behavioral: Negative for confusion.  All other systems reviewed and are negative.    Allergies  Sulfa drugs cross reactors and Tetracyclines &  related  Home Medications   Current Outpatient Rx  Name  Route  Sig  Dispense  Refill  . hydrochlorothiazide 50 MG tablet   Oral   Take 25 mg by mouth daily.          Marland Kitchen levothyroxine (SYNTHROID, LEVOTHROID) 125 MCG tablet   Oral   Take 125 mcg by mouth daily before breakfast. Skip 7th day dose           BP 105/54  Pulse 71  Temp(Src) 98.6 F (37 C) (Oral)  Resp 16  SpO2 99%  Physical Exam  Nursing note and vitals reviewed. Constitutional: She is oriented to person, place, and time. She appears well-developed and well-nourished. No distress.  HENT:  Head: Normocephalic.    Right Ear: No hemotympanum.  Left Ear: No hemotympanum.  Nose: Nose normal.  Mouth/Throat: Uvula is midline, oropharynx  is clear and moist and mucous membranes are normal.  Eyes: Conjunctivae and EOM are normal. Pupils are equal, round, and reactive to light.  Neck: Normal range of motion. Neck supple.  Cardiovascular: Normal rate, regular rhythm, normal heart sounds and intact distal pulses.   Pulmonary/Chest: Effort normal and breath sounds normal. No respiratory distress.  Abdominal: Soft. Bowel sounds are normal. She exhibits no distension and no mass. There is no tenderness.  Musculoskeletal: Normal range of motion. She exhibits no edema.  Neurological: She is alert and oriented to person, place, and time. She has normal strength. No cranial nerve deficit or sensory deficit. She displays a negative Romberg sign. Coordination normal. GCS eye subscore is 4. GCS verbal subscore is 5. GCS motor subscore is 6.  Skin: Skin is warm and dry. She is not diaphoretic.  Psychiatric: She has a normal mood and affect. Her speech is normal and behavior is normal.    ED Course  Procedures (including critical care time)  LACERATION REPAIR Performed by: Johnnette Gourd Authorized by: Johnnette Gourd Consent: Verbal consent obtained. Risks and benefits: risks, benefits and alternatives were discussed Consent given by: patient Patient identity confirmed: provided demographic data Prepped and Draped in normal sterile fashion Wound explored  Laceration Location: right side of head  Laceration Length: 0.75 cm  No Foreign Bodies seen or palpated  Anesthesia: none  Amount of cleaning: standard  Skin closure: dermabond  Patient tolerance: Patient tolerated the procedure well with no immediate complications.  Labs Reviewed  CBC - Abnormal; Notable for the following:    WBC 3.3 (*)    RBC 3.82 (*)    HCT 35.4 (*)    All other components within normal limits  BASIC METABOLIC PANEL - Abnormal; Notable for the following:    Sodium 132 (*)    Potassium 3.2 (*)    Glucose, Bld 102 (*)    Calcium 7.6 (*)    GFR calc  non Af Amer 46 (*)    GFR calc Af Amer 53 (*)    All other components within normal limits  URINALYSIS, ROUTINE W REFLEX MICROSCOPIC - Abnormal; Notable for the following:    Color, Urine AMBER (*)    APPearance CLOUDY (*)    Hgb urine dipstick MODERATE (*)    Bilirubin Urine MODERATE (*)    Ketones, ur 15 (*)    Protein, ur 30 (*)    Leukocytes, UA TRACE (*)    All other components within normal limits  URINE MICROSCOPIC-ADD ON - Abnormal; Notable for the following:    Squamous Epithelial / LPF FEW (*)  Bacteria, UA MANY (*)    All other components within normal limits  URINE CULTURE    Date: 11/16/2012  Rate: 67  Rhythm: normal sinus rhythm  QRS Axis: left  Intervals: normal  ST/T Wave abnormalities: nonspecific T wave changes borderline, lateral leads  Conduction Disutrbances:none  Narrative Interpretation: no stemi, no sig change since EKG from 01/20/2011  Old EKG Reviewed: changes noted RAD to LAD   Ct Head Wo Contrast  11/16/2012   *RADIOLOGY REPORT*  Clinical Data: Syncope.  CT HEAD WITHOUT CONTRAST  Technique:  Contiguous axial images were obtained from the base of the skull through the vertex without contrast.  Comparison: Brain MRI 05/08/2009.  Findings: There is cortical atrophy and chronic microvascular ischemic change.  No evidence of acute abnormality including infarct, hemorrhage, mass lesion, mass effect, midline shift or abnormal extra-axial fluid collection.  No hydrocephalus or pneumocephalus.  The calvarium is intact.  IMPRESSION: No acute finding.  Atrophy and chronic microvascular ischemic change.   Original Report Authenticated By: Holley Dexter, M.D.     1. Syncope       MDM  79 year old female with multiple syncopal episodes. She did hit her head or lose consciousness. Head CT negative for any acute intracranial abnormality. EKG not concerning. Labs to not explain reasoning behind syncope. Vitals are stable. Patient will be admitted for further  evaluation. I discussed this patient with Dr. Denton Lank who also evaluated patient and agrees with plan of care. I spoke with Dr. Dorise Hiss with internal medicine teaching service who accepted patient for admission. Family agreeable with plan.       Trevor Mace, PA-C 11/16/12 1323  Trevor Mace, PA-C 11/16/12 [phone]

## 2012-11-16 NOTE — ED Notes (Signed)
Internal medicine at bedside

## 2012-11-16 NOTE — Consult Note (Signed)
CARDIOLOGY CONSULT NOTE    Patient ID: Morgan Weaver MRN: [id number] DOB/AGE: [birth year] 79 y.o.  Admit date: 11/16/2012 Referring Physician:  Meredith Pel Primary Physician: Kaleen Mask, MD Primary Cardiologist:  New Reason for Consultation:  synocpe  Principal Problem:   Syncope Active Problems:   Acute UTI   Hypertension   Hypothyroid   Rash   HPI:  79 year old female past medical history of hypertension, dyslipidemia, hypothyroidism. She presented to emergency department 11/16/12 with history of two episodes of syncope over the last 2 days. Both episodes the patient lost consciousness Both episodes in am Both in bathroom  Once after brushing teeth and one after using toilet  On 11/15/12 she went to the local fire department who examined her and she was supposed to increase her oral intake. Yesterday's syncopal episode was associated with nausea and decreased oral intake. On 11/16/12 her son Wallace Cullens found her on the bathroom floor where she had been out for about approximately at least 5 minutes. He applied a wet towel to her face. She also hit her head at some point and had a right scalp laceration. Both sons state the patient is independent at home is is not acting differently after her fall. Review of symptoms positive for decreased appetite 6/3 and 6/4, wound to right scalp from fall, fall, resolved nausea on 11/15/12. Negative for palpitations, chest pain, orthopnea, shortness of breath, vomiting, abdominal pain/diarrhea, dysuria, blood in urine or stool, dizziness or lightheadedness, postural lightheadedness, vertigo,bladder or bowel incontinence. She has had a rash x 1 week which itches and started on her right forearm. Rash stated as a bump on her right forearm and now is starting to spread on both forearms. She denies history of tick bites and has 1 indoor cat and 5 outdoors cats.   No cardiac history  No cardiac symptoms before falling.  She is very active and independent and she does not  fall.  In ER ECG normal Telemetry normal.  Posturals not recorded.  She sees Dr Jeannetta Nap in [place] regularly and has never had heart issues    All other systems reviewed and negative except as noted above  Past Medical History  Diagnosis Date  . Torn rotator cuff   . Ovarian cyst   . Lyme disease   . Hypertension   . Hemorrhoid   . Arthritis   . Weight decrease   . Hyperlipidemia   . Thyroid disease     hypothyroidism    Family History  Problem Relation Age of Onset  . Heart disease Father     heart attack  . Hypertension Father   . Other Son     brain tumor     History   Social History  . Marital Status: Married    Spouse Name: N/A    Number of Children: N/A  . Years of Education: N/A   Occupational History  . Not on file.   Social History Main Topics  . Smoking status: Former Games developer  . Smokeless tobacco: Not on file     Comment: quit-1977  . Alcohol Use: 8.4 oz/week    14 Glasses of wine per week     Comment: BEFORE DINNER  . Drug Use: No  . Sexually Active:    Other Topics Concern  . 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 Appendectomy  1952  . Rotator cuff repair  1989    left  . Oophorectomy  1955    removed due to the ovary having gangrene  . Abdominal hysterectomy  1970  . Knee arthroscopy    . Tonsillectomy    . Other surgical history      left foot surgery  . Other surgical history      right foot surgery     . cefTRIAXone (ROCEPHIN)  IV  1 g Intravenous Q24H  . enoxaparin (LOVENOX) injection  40 mg Subcutaneous Q24H  . [START ON 11/17/2012] levothyroxine  125 mcg Oral QAC breakfast  . sodium chloride  3 mL Intravenous Q12H   . sodium chloride      Physical  Exam: Blood pressure 118/56, pulse 67, temperature 98.6 F (37 C), temperature source Oral, resp. rate 15, SpO2 96.00%.  Affect appropriate Healthy:  appears stated age HEENT: normal Neck supple with no adenopathy JVP normal no bruits no thyromegaly Lungs clear with no wheezing and good diaphragmatic motion Heart:  S1/S2 no murmur, no rub, gallop or click PMI normal Abdomen: benighn, BS positve, no tenderness, no AAA no bruit.  No HSM or HJR Distal pulses intact with no bruits No edema Neuro non-focal Skin warm and dry No muscular weakness Laceratin and echymosis right scalp  Labs:   Lab Results  Component Value Date   WBC 3.3* 11/16/2012   HGB 12.6 11/16/2012   HCT 35.4* 11/16/2012   MCV 92.7 11/16/2012   PLT 83* 11/16/2012    Recent Labs Lab 11/16/12 1222  NA 132*  K 3.2*  CL 100  CO2 23  BUN 14  CREATININE 1.09  CALCIUM 7.6*  GLUCOSE 102*      Radiology: Ct Head Wo Contrast  11/16/2012   *RADIOLOGY REPORT*  Clinical Data: Syncope.  CT HEAD WITHOUT CONTRAST  Technique:  Contiguous axial images were obtained from the base of the skull through the vertex without contrast.  Comparison: Brain MRI 05/08/2009.  Findings: There is cortical atrophy and chronic microvascular ischemic change.  No evidence of acute abnormality including infarct, hemorrhage, mass lesion, mass effect, midline shift or abnormal extra-axial fluid collection.  No hydrocephalus or pneumocephalus.  The calvarium is intact.  IMPRESSION: No acute finding.  Atrophy and chronic microvascular ischemic change.   Original Report Authenticated By: Holley Dexter, M.D.    EKG:  SR rate 65 done in 2012 normal Cannot find ECG from ER and not in EPIC yet   ASSESSMENT AND PLAN:  Syncope:  Not likley cardiac with no history or symptoms. Telemetry is normal to date Need to review ECG in ER.  Echo in am to assess RV and LV function Still needs CXR Would not think further cardiac w/u needed if echo normal.  Hydrate and  check posturals.   Thyroid:  Compliant with meds TSH pending Pancytopenia.  W/u per primary service no history of but not severe Urine:  Cloudy with trace nitrates no fever or signs of infection Culture pending and may be related to dehydration and malaise Started on ceftriaxone  Signed: Charlton Haws 11/16/2012, 4:10 PM

## 2012-11-17 ENCOUNTER — Inpatient Hospital Stay (HOSPITAL_COMMUNITY): Payer: Medicare Other

## 2012-11-17 DIAGNOSIS — D696 Thrombocytopenia, unspecified: Secondary | ICD-10-CM

## 2012-11-17 DIAGNOSIS — D72819 Decreased white blood cell count, unspecified: Secondary | ICD-10-CM

## 2012-11-17 LAB — URINE CULTURE: Colony Count: 85000

## 2012-11-17 LAB — CBC WITH DIFFERENTIAL/PLATELET
Eosinophils Absolute: 0 10*3/uL (ref 0.0–0.7)
Eosinophils Relative: 0 % (ref 0–5)
Hemoglobin: 13.7 g/dL (ref 12.0–15.0)
Lymphocytes Relative: 41 % (ref 12–46)
Lymphs Abs: 1.2 10*3/uL (ref 0.7–4.0)
MCH: 32.9 pg (ref 26.0–34.0)
MCV: 94 fL (ref 78.0–100.0)
Monocytes Relative: 9 % (ref 3–12)
Platelets: 43 10*3/uL — ABNORMAL LOW (ref 150–400)
RBC: 4.17 MIL/uL (ref 3.87–5.11)
WBC: 2.8 10*3/uL — ABNORMAL LOW (ref 4.0–10.5)

## 2012-11-17 LAB — COMPREHENSIVE METABOLIC PANEL
AST: 32 U/L (ref 0–37)
BUN: 13 mg/dL (ref 6–23)
CO2: 23 mEq/L (ref 19–32)
Calcium: 8.2 mg/dL — ABNORMAL LOW (ref 8.4–10.5)
Creatinine, Ser: 1.11 mg/dL — ABNORMAL HIGH (ref 0.50–1.10)
GFR calc Af Amer: 52 mL/min — ABNORMAL LOW (ref >90)
GFR calc non Af Amer: 45 mL/min — ABNORMAL LOW (ref >90)
Total Bilirubin: 0.7 mg/dL (ref 0.3–1.2)

## 2012-11-17 LAB — CBC
Hemoglobin: 11.8 g/dL — ABNORMAL LOW (ref 12.0–15.0)
MCH: 32.9 pg (ref 26.0–34.0)
Platelets: 53 10*3/uL — ABNORMAL LOW (ref 150–400)
RBC: 3.59 MIL/uL — ABNORMAL LOW (ref 3.87–5.11)
WBC: 1.8 10*3/uL — ABNORMAL LOW (ref 4.0–10.5)

## 2012-11-17 LAB — TROPONIN I: Troponin I: <0.3 ng/mL (ref <0.30)

## 2012-11-17 LAB — TSH: TSH: 1.213 u[IU]/mL (ref 0.350–4.500)

## 2012-11-17 MED ORDER — ACETAMINOPHEN 325 MG PO TABS
650.0000 mg | ORAL_TABLET | Freq: Four times a day (QID) | ORAL | Status: DC | PRN
  Administered 2012-11-17 – 2012-11-18 (×4): 650 mg via ORAL
  Filled 2012-11-17 (×4): qty 2

## 2012-11-17 NOTE — Progress Notes (Signed)
Patient: Morgan Weaver Date of Encounter: 11/17/2012, 9:07 AM Admit date: 11/16/2012     Subjective  Ms. Morgan Weaver has no complaints this AM. She denies CP, SOB, palpitations or dizziness.   Objective  Physical Exam: Vitals: BP 132/67  Pulse 58  Temp(Src) 99.2 F (37.3 C) (Oral)  Resp 18  SpO2 99% General: Well developed, well appearing 79 year old female in no acute distress. Neck: Supple. JVD not elevated. Lungs: Clear bilaterally to auscultation without wheezes, rales, or rhonchi. Breathing is unlabored. Heart: RRR S1 S2 without murmurs, rubs, or gallops.  Abdomen: Soft, non-distended. Extremities: No clubbing or cyanosis. No edema.  Distal pedal pulses are 2+ and equal bilaterally. Neuro: Alert and oriented X 3. Moves all extremities spontaneously. No focal deficits.  Intake/Output:  Intake/Output Summary (Last 24 hours) at 11/17/12 0907 Last data filed at 11/16/12 1700  Gross per 24 hour  Intake    240 ml  Output      0 ml  Net    240 ml    Inpatient Medications:  . cefTRIAXone (ROCEPHIN)  IV  1 g Intravenous Q24H  . enoxaparin (LOVENOX) injection  40 mg Subcutaneous Q24H  . levothyroxine  125 mcg Oral QAC breakfast  . sodium chloride  3 mL Intravenous Q12H   . sodium chloride 100 mL/hr at 11/16/12 1617    Labs:  Recent Labs  11/16/12 1222 11/16/12 1749 11/17/12 0550  NA 132*  --  132*  K 3.2*  --  3.9  CL 100  --  102  CO2 23  --  23  GLUCOSE 102*  --  97  BUN 14  --  13  CREATININE 1.09  --  1.11*  CALCIUM 7.6*  --  8.2*  MG  --  1.7  --     Recent Labs  11/17/12 0550  AST 32  ALT 15  ALKPHOS 45  BILITOT 0.7  PROT 5.3*  ALBUMIN 2.8*    Recent Labs  11/16/12 1222 11/17/12 0550  WBC 3.3* 1.8*  NEUTROABS 2.5  --   HGB 12.6 11.8*  HCT 35.4* 33.5*  MCV 92.7 93.3  PLT 83* PENDING    Recent Labs  11/16/12 1748 11/16/12 2208 11/17/12 0550  TROPONINI <0.30 <0.30 <0.30    Recent Labs  11/16/12 1749  TSH 1.213     Radiology/Studies: Dg Chest 2 View  11/17/2012   *RADIOLOGY REPORT*  Clinical Data: Syncope.  CHEST - 2 VIEW  Comparison: 06/29/2007  Findings: Heart and mediastinal contours are within normal limits. No focal opacities or effusions.  No acute bony abnormality. Thoracolumbar scoliosis and degenerative changes.  IMPRESSION: No active cardiopulmonary disease.   Original Report Authenticated By: Charlett Nose, M.D.   Ct Head Wo Contrast  11/16/2012   *RADIOLOGY REPORT*  Clinical Data: Syncope.  CT HEAD WITHOUT CONTRAST  Technique:  Contiguous axial images were obtained from the base of the skull through the vertex without contrast.  Comparison: Brain MRI 05/08/2009.  Findings: There is cortical atrophy and chronic microvascular ischemic change.  No evidence of acute abnormality including infarct, hemorrhage, mass lesion, mass effect, midline shift or abnormal extra-axial fluid collection.  No hydrocephalus or pneumocephalus.  The calvarium is intact.  IMPRESSION: No acute finding.  Atrophy and chronic microvascular ischemic change.   Original Report Authenticated By: Holley Dexter, M.D.    Echocardiogram: pending Telemetry: SR with brief, nonsustained atrial tachycardia    Assessment and Plan  1. Syncope Etiology unclear although  both episodes sound most typical with vasovagal/neurally mediated syncope - one after standing from commode and the other while standing to brush teeth. She denies any real warning symptoms or prodrome. She denies CP, SOB or palpitations. CEs negative. Echo pending. Has had brief, nonsustained atrial tachycardia on telemetry but asymptomatic and likely a coincidental finding. If echo normal, no further inpatient cardiac work-up indicated. Consider outpatient event monitor at discharge.  Dr. Ladona Ridgel to see Signed, Exie Parody  EP Attending  Patient seen and examined. Agree with above history, exam, assessment and plan. At this point would avoid volume  depletion. Would not recommend Implantable loop recorder but this would be a diagnostic consideration if her symptoms recur and do not appear to be neurally mediated.   Morgan Weaver.D.

## 2012-11-17 NOTE — ED Provider Notes (Signed)
Medical screening examination/treatment/procedure(s) were conducted as a shared visit with non-physician practitioner(s) and myself.  I personally evaluated the patient during the encounter Pt s/p 2-3 syncopal events in past few days. No palpitations. No cp or sob. No headache. No neck or back pain. Spine nt. Rrr. Chest cta. Labs. Admit.   Suzi Roots, MD 11/17/12 [phone]

## 2012-11-17 NOTE — Consult Note (Signed)
Reason for Referral: Mild pancytopenia.   HPI: Patient is an 79 year old woman with no known hematological problems but with a history of hypertension, hyperlipidemia, hypothyroidism. She was admitted on 11/16/2012 with complaint of loss of consciousness x2 episodes over the past 2 days. She also denies fever, chills, sweats, headache, chest pain, shortness of breath, palpitations, abdominal pain, nausea, vomiting, diarrhea, bright red blood per rectum, or lower extremity edema. She reports that she has had normal oral intake recently. She has no prior history of syncope or seizures. She is on no new medications. No bleeding or infections noted.  Labs show mild pancytopenia which is new compared CBC in 2012 which was normal.  Patient feeling well this am. She noted bleeding around the IV site overnight.  No easy other bleeding noted   Past Medical History  Diagnosis Date  . Torn rotator cuff   . Ovarian cyst   . Lyme disease   . Hypertension   . Hemorrhoid   . Arthritis   . Weight decrease   . Hyperlipidemia   . Thyroid disease     hypothyroidism  :  Past Surgical History  Procedure Laterality Date  . Appendectomy  1952  . Rotator cuff repair  1989    left  . Oophorectomy  1955    removed due to the ovary having gangrene  . Abdominal hysterectomy  1970  . Knee arthroscopy    . Tonsillectomy    . Other surgical history      left foot surgery  . Other surgical history      right foot surgery  :  Current Facility-Administered Medications  Medication Dose Route Frequency Provider Last Rate Last Dose  . 0.9 %  sodium chloride infusion   Intravenous Continuous Judie Bonus, MD 100 mL/hr at 11/17/12 1234    . acetaminophen (TYLENOL) tablet 650 mg  650 mg Oral Q6H PRN Dow Adolph, MD   650 mg at 11/17/12 1609  . cefTRIAXone (ROCEPHIN) 1 g in dextrose 5 % 50 mL IVPB  1 g Intravenous Q24H Judie Bonus, MD   1 g at 11/17/12 1637  . diphenhydrAMINE (BENADRYL) capsule  25 mg  25 mg Oral Q6H PRN Judie Bonus, MD      . levothyroxine (SYNTHROID, LEVOTHROID) tablet 125 mcg  125 mcg Oral QAC breakfast Judie Bonus, MD   125 mcg at 11/17/12 0847  . ondansetron (ZOFRAN) tablet 4 mg  4 mg Oral Q6H PRN Judie Bonus, MD       Or  . ondansetron [facility]) injection 4 mg  4 mg Intravenous Q6H PRN Judie Bonus, MD      . sodium chloride 0.9 % injection 3 mL  3 mL Intravenous Q12H Judie Bonus, MD   3 mL at 11/17/12 1000     Allergen Reactions  . Mobic (Meloxicam)     Abdominal pain, ?GI ulcer    . Sulfa Drugs Cross Reactors     unknown  . Tetracyclines & Related Swelling    Lips swell When asked 6/4 patient states unknown reaction  :  Family History  Problem Relation Age of Onset  . Heart disease Father     heart attack  . Hypertension Father   . Other Son     brain tumor   :  History   Social History  . Marital Status: Married    Spouse Name: N/A    Number of Children: N/A  . Years of Education:  N/A   Occupational History  . Not on file.   Social History Main Topics  . Smoking status: Former Games developer  . Smokeless tobacco: Not on file     Comment: quit-1977  . Alcohol Use: 8.4 oz/week    14 Glasses of wine per week     Comment: BEFORE DINNER  . Drug Use: No  . Sexually Active:    Other Topics Concern  . 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  Exam: Blood pressure 132/71, pulse 62, temperature 98.3 F (36.8 C), temperature source Oral, resp. rate 17, SpO2 95.00%. General appearance: alert and cooperative Head: Normocephalic, without obvious abnormality, atraumatic Throat: lips, mucosa, and tongue normal; teeth and gums normal Neck: no adenopathy,  no carotid bruit, no JVD, supple, symmetrical, trachea midline and thyroid not enlarged, symmetric, no tenderness/mass/nodules Resp: clear to auscultation bilaterally Chest wall: no tenderness Cardio: regular rate and rhythm, S1, S2 normal, no murmur, click, rub or gallop GI: soft, non-tender; bowel sounds normal; no masses,  no organomegaly Extremities: extremities normal, atraumatic, no cyanosis or edema Pulses: 2+ and symmetric Skin: no ecchymosis noted on exam.   Recent Labs  11/17/12 0550 11/17/12 1331  WBC 1.8* 2.8*  HGB 11.8* 13.7  HCT 33.5* 39.2  PLT 53* 43*    Recent Labs  11/16/12 1222 11/17/12 0550  NA 132* 132*  K 3.2* 3.9  CL 100 102  CO2 23 23  GLUCOSE 102* 97  BUN 14 13  CREATININE 1.09 1.11*  CALCIUM 7.6* 8.2*     Blood smear review: I reviewed her smear personally and did not show any rbc fragments or spherocytosis. White cells are normal with no immature cells or blasts. Normal, enlarged platelets.      Dg Chest 2 View  11/17/2012   *RADIOLOGY REPORT*  Clinical Data: Syncope.  CHEST - 2 VIEW  Comparison: 06/29/2007  Findings: Heart and mediastinal contours are within normal limits. No focal opacities or effusions.  No acute bony abnormality. Thoracolumbar scoliosis and degenerative changes.  IMPRESSION: No active cardiopulmonary disease.   Original Report Authenticated By: Charlett Nose, M.D.   Ct Head Wo Contrast  11/16/2012   *RADIOLOGY REPORT*  Clinical Data: Syncope.  CT HEAD WITHOUT CONTRAST  Technique:  Contiguous axial images were obtained from the base of the skull through the vertex without contrast.  Comparison: Brain MRI 05/08/2009.  Findings: There is cortical atrophy and chronic microvascular ischemic change.  No evidence of acute abnormality including infarct, hemorrhage, mass lesion, mass effect, midline shift or abnormal extra-axial fluid collection.  No hydrocephalus or pneumocephalus.  The calvarium is intact.  IMPRESSION: No acute finding.   Atrophy and chronic microvascular ischemic change.   Original Report Authenticated By: Holley Dexter, M.D.    Assessment and Plan:   79 year old with mild pancytopenia. The etiology is likely reactive due to acute illness, autoimmune vs early myelodysplastic syndrome. Her smear does not indicated an acute process.  Her platelets and white cells are mildly low. She is at low risk of bleeding or infection.  I do not see the need for any injections or transfusions.  She will likely need bone marrow biopsy which can be done as outpatient.  Will arrange follow up with hematology upon discharge.  I see no reason to keep her in the hospital for this issue.   Please call with question.

## 2012-11-17 NOTE — Progress Notes (Signed)
Internal Medicine Attending  Date: 11/17/2012  Patient name: Morgan Weaver Medical record number: [id number] Date of birth: Jul 06, 1928 Age: 79 y.o. Gender: female  I saw and evaluated the patient. I reviewed the resident's note by Dr. Collier Bullock and I agree with the resident's findings and plans as documented in her note, with the following additional comments.  Patient's syncopal episodes were without any premonitory symptoms and the history to me does not suggest vasovagal faint; if inpatient workup is unrevealing, then I think an event monitor at discharge would be a good idea.  Patient also has leukopenia and thrombocytopenia, with WBC today 2.8, absolute neutrophil count 1.3, and platelet count now 43; the etiology is unclear; differential includes medication effect, autoimmune disease, or a process affecting bone marrow such as myelodysplastic syndrome.  We stopped hydrochlorothiazide at the time of admission.  Would stop low molecular weight heparin as well and use pneumatic compression stockings for DVT prophylaxis.  Would get labs to include an ANA, complement levels( C3, C4), and an erythrocyte sedimentation rate.  Would also consult hematology/oncology for assessment of leukopenia and thrombocytopenia.

## 2012-11-17 NOTE — Progress Notes (Signed)
Consult received.  Labs reviewed as well I personally reviewed her smear. I see no immature cell at this time, no dysplasia or red cell fragments.  Patient not present in the room for examination at this time and full consult note to follow.   I think she has mild thrombocytopenia and leukopenia and possible mild anemia. The likely etiology is likely reactive vs. early  Myelodysplasia (MDS).  No inpatient intervention is needed, she will likely need a bone marrow biopsy which can be done as outpatient.

## 2012-11-17 NOTE — Progress Notes (Signed)
  Echocardiogram 2D Echocardiogram has been performed.  Morgan Weaver FRANCES 11/17/2012, 5:53 PM

## 2012-11-17 NOTE — Progress Notes (Signed)
Subjective: No acute events overnight, no specific complaints this AM. States she still does not know why she passed out and hit her head. Denies chest pain, SOB, palpitations, dizziness.  ECHO read pending  Objective: Vital signs in last 24 hours: Filed Vitals:   11/16/12 1634 11/16/12 1636 11/16/12 2129 11/17/12 0533  BP: 147/77 127/75 109/61 132/67  Pulse: 62 68 66 58  Temp: 98.5 F (36.9 C) 98.5 F (36.9 C) 99.4 F (37.4 C) 99.2 F (37.3 C)  TempSrc: Oral Oral Oral Oral  Resp: 14 18 18 18   SpO2: 100% 100% 98% 99%   Weight change:   Intake/Output Summary (Last 24 hours) at 11/17/12 1044 Last data filed at 11/17/12 0900  Gross per 24 hour  Intake    360 ml  Output      0 ml  Net    360 ml    Physical Exam Blood pressure 132/67, pulse 58, temperature 99.2 F (37.3 C), temperature source Oral, resp. rate 18, SpO2 99.00%. General:  No acute distress, alert and oriented x 3, well-appearing CF HEENT:  PERRL, EOMI, moist mucous membranes, [place] in place Cardiovascular:  HR 50s, Regular rhythm, no murmurs, rubs or gallops Respiratory:  Clear to auscultation bilaterally, no wheezes, rales, or rhonchi Abdomen:  Soft, nondistended, nontender, ++ bowel sounds Extremities:  Warm and well-perfused, no edema.  Skin: Warm, dry, no rashes noted today Neuro: Not anxious appearing, no depressed mood, normal affect  Lab Results: Basic Metabolic Panel:  Recent Labs Lab 11/16/12 1222 11/16/12 1749 11/17/12 0550  NA 132*  --  132*  K 3.2*  --  3.9  CL 100  --  102  CO2 23  --  23  GLUCOSE 102*  --  97  BUN 14  --  13  CREATININE 1.09  --  1.11*  CALCIUM 7.6*  --  8.2*  MG  --  1.7  --    Liver Function Tests:  Recent Labs Lab 11/17/12 0550  AST 32  ALT 15  ALKPHOS 45  BILITOT 0.7  PROT 5.3*  ALBUMIN 2.8*   CBC:  Recent Labs Lab 11/16/12 1222 11/17/12 0550  WBC 3.3* 1.8*  NEUTROABS 2.5  --   HGB 12.6 11.8*  HCT 35.4* 33.5*  MCV 92.7 93.3  PLT 83* 53*    Cardiac Enzymes:  Recent Labs Lab 11/16/12 1748 11/16/12 2208 11/17/12 0550  TROPONINI <0.30 <0.30 <0.30   Thyroid Function Tests:  Recent Labs Lab 11/16/12 1749  TSH 1.213   Urinalysis:  Recent Labs Lab 11/16/12 1228  COLORURINE AMBER*  LABSPEC 1.028  PHURINE 5.5  GLUCOSEU NEGATIVE  HGBUR MODERATE*  BILIRUBINUR MODERATE*  KETONESUR 15*  PROTEINUR 30*  UROBILINOGEN 1.0  NITRITE NEGATIVE  LEUKOCYTESUR TRACE*     Micro Results: Recent Results (from the past 240 hour(s))  CULTURE, BLOOD (ROUTINE X 2)     Status: None   Collection Time    11/16/12  5:30 PM      Result Value Range Status   Specimen Description BLOOD RIGHT ARM   Final   Special Requests BOTTLES DRAWN AEROBIC ONLY 5CC   Final   Culture  Setup Time 11/16/2012 22:38   Final   Culture     Final   Value:        BLOOD CULTURE RECEIVED NO GROWTH TO DATE CULTURE WILL BE HELD FOR 5 DAYS BEFORE ISSUING A FINAL NEGATIVE REPORT   Report Status PENDING   Incomplete   Studies/Results: Dg  Chest 2 View  11/17/2012   *RADIOLOGY REPORT*  Clinical Data: Syncope.  CHEST - 2 VIEW  Comparison: 06/29/2007  Findings: Heart and mediastinal contours are within normal limits. No focal opacities or effusions.  No acute bony abnormality. Thoracolumbar scoliosis and degenerative changes.  IMPRESSION: No active cardiopulmonary disease.   Original Report Authenticated By: Charlett Nose, M.D.   Ct Head Wo Contrast  11/16/2012   *RADIOLOGY REPORT*  Clinical Data: Syncope.  CT HEAD WITHOUT CONTRAST  Technique:  Contiguous axial images were obtained from the base of the skull through the vertex without contrast.  Comparison: Brain MRI 05/08/2009.  Findings: There is cortical atrophy and chronic microvascular ischemic change.  No evidence of acute abnormality including infarct, hemorrhage, mass lesion, mass effect, midline shift or abnormal extra-axial fluid collection.  No hydrocephalus or pneumocephalus.  The calvarium is intact.   IMPRESSION: No acute finding.  Atrophy and chronic microvascular ischemic change.   Original Report Authenticated By: Holley Dexter, M.D.   Medications:  Medications reviewed  Scheduled Meds: . cefTRIAXone (ROCEPHIN)  IV  1 g Intravenous Q24H  . enoxaparin (LOVENOX) injection  40 mg Subcutaneous Q24H  . levothyroxine  125 mcg Oral QAC breakfast  . sodium chloride  3 mL Intravenous Q12H   Continuous Infusions: . sodium chloride 100 mL/hr at 11/16/12 1617   PRN Meds:.acetaminophen, diphenhydrAMINE, ondansetron (ZOFRAN) IV, ondansetron  Assessment/Plan: 79 year old female past medical history of hypertension, dyslipidemia, hypothyroidism who presents after two episodes of syncope at home.   1. Syncope  Vasovagal vs arrhythmia vs neurogenic vs orthostasis. Patient did not have classic prodromal symptoms which often go along with vasovagal syncope. More concern for arrhythmia in this case. Telemetry overnight with NS asymptomatic tachycardia troponins negative overnight  -appreciate cardiology input, would agree with holter monitor -ECHO results pending -cont IVF -R scalp laceration is clean and dry -PT consult to walk patient  2. Suspected UTI  -Denies dysuria. Will treat with Rx Rocephin and d/c if urine culture negative.   3. Hypokalemia  3.2 on admission. Given Kdur 40 meQ x 1.  3.9 this AM, Mag 1.7  4. Leukopenia andThrombocytopenia  -No previous history of low WBCs or low platelets. Etiology could be infectious (in the history it states patient has a history of lyme disease) or related to sulfa containing drugs (i.e HCTZ), malignancy must be in the differential  -will order a peripheral smear review  -differential ordered  5. Rash  -no rash seen this morning and patient denies  6. History of HTN  -Monitor VS  -On HCTZ 25 mg at home, may resume  7. History of hypothyroidism  TSH wnl -cont Synthroid 125 mcg daily   8. F/E/N  -NS 100 cc/hr  -regular diet   9.  DVT px  -Lovenox   Code Status: full code   Dispo: Disposition is deferred at this time, awaiting improvement of current medical problems. Anticipated discharge in approximately 1-2 day(s).  The patient does have a current PCP Kaleen Mask, MD), therefore will not requiring OPC follow-up after discharge.  The patient does not have transportation limitations that hinder transportation to clinic appointments.     LOS: 1 day   [place] Ar 11/17/2012, 10:44 AM

## 2012-11-18 DIAGNOSIS — D61818 Other pancytopenia: Secondary | ICD-10-CM

## 2012-11-18 LAB — CBC WITH DIFFERENTIAL/PLATELET
Eosinophils Absolute: 0 10*3/uL (ref 0.0–0.7)
Eosinophils Relative: 1 % (ref 0–5)
Hemoglobin: 13.3 g/dL (ref 12.0–15.0)
Lymphocytes Relative: 39 % (ref 12–46)
Lymphs Abs: 0.9 10*3/uL (ref 0.7–4.0)
MCH: 32.1 pg (ref 26.0–34.0)
MCV: 93 fL (ref 78.0–100.0)
Monocytes Relative: 9 % (ref 3–12)
Platelets: 48 10*3/uL — ABNORMAL LOW (ref 150–400)
RBC: 4.14 MIL/uL (ref 3.87–5.11)
WBC: 2.2 10*3/uL — ABNORMAL LOW (ref 4.0–10.5)

## 2012-11-18 LAB — C3 COMPLEMENT: C3 Complement: 94 mg/dL (ref 90–180)

## 2012-11-18 LAB — BASIC METABOLIC PANEL
BUN: 11 mg/dL (ref 6–23)
CO2: 22 mEq/L (ref 19–32)
Calcium: 8.6 mg/dL (ref 8.4–10.5)
Glucose, Bld: 101 mg/dL — ABNORMAL HIGH (ref 70–99)
Potassium: 4.1 mEq/L (ref 3.5–5.1)
Sodium: 132 mEq/L — ABNORMAL LOW (ref 135–145)

## 2012-11-18 MED ORDER — BACITRACIN-NEOMYCIN-POLYMYXIN OINTMENT TUBE
TOPICAL_OINTMENT | Freq: Two times a day (BID) | CUTANEOUS | Status: DC
  Administered 2012-11-18: 16:00:00 via TOPICAL
  Filled 2012-11-18: qty 15

## 2012-11-18 MED ORDER — BACITRACIN-NEOMYCIN-POLYMYXIN OINTMENT TUBE: 1.0000 "application " | TOPICAL_OINTMENT | Freq: Two times a day (BID) | CUTANEOUS | Status: DC

## 2012-11-18 NOTE — Care Management Note (Signed)
    Page 1 of 1   11/18/2012     3:58:41 PM   CARE MANAGEMENT NOTE 11/18/2012  Patient:  Morgan Weaver, Morgan Weaver   Account Number:  [id number]  Date Initiated:  11/18/2012  Documentation initiated by:  GRAVES-BIGELOW,Wenzel Backlund  Subjective/Objective Assessment:   Pt admitted for syncope. Plan for home today with son. Pt is agreeable to [facility] services via [facility]. Referral was made and SOC toegin within 24-48 hours of d/c.     Action/Plan:   No further needs from CM at this time.   Anticipated DC Date:  11/18/2012   Anticipated DC Plan:  HOME W HOME HEALTH SERVICES      DC Planning Services  CM consult      [facility] Choice  HOME HEALTH   Choice offered to / List presented to:  C-1 Patient        HH arranged  HH-2 PT      [facility] agency  [facility]   Status of service:  Completed, signed off Medicare Important Message given?   (If response is "NO", the following Medicare IM given date fields will be blank) Date Medicare IM given:   Date Additional Medicare IM given:    Discharge Disposition:  HOME W HOME HEALTH SERVICES  Per UR Regulation:  Reviewed for med. necessity/level of care/duration of stay  If discussed at Long Length of Stay Meetings, dates discussed:    Comments:

## 2012-11-18 NOTE — Consult Note (Signed)
WOC consult Note Reason for Consult: Consult requested for right scalp wound. Pt fell at home prior to admission and scalp laceration was repaired with derma bond in the ER according to Epic notes. Wound type: Full thickness laceration Measurement: 1X.5X.1cm Wound bed: pink and dry Drainage (amount, consistency, odor) no odor or drainage Periwound: intact skin surrounding Dressing procedure/placement/frequency: No further topical treatment needed at this time.  Neosporin BID to provide antimicrobial benefits and promote healing. Please re-consult if further assistance is needed.  Thank-you,  Cammie Mcgee MSN, RN, CWOCN, [place], CNS [phone]

## 2012-11-18 NOTE — Progress Notes (Signed)
Patient ID: Morgan Weaver, female   DOB: 01/02/1929, 79 y.o.   MRN: [id number] Subjective:  "They better let me go home before I bleed to death". Patient's remarks after IV dislodgement resulted in blood in her bed last p.m. Denies chest pain or sob. No syncope  Objective:  Vital Signs in the last 24 hours: Temp:  [97.7 F (36.5 C)-98.3 F (36.8 C)] 97.7 F (36.5 C) (06/06 0636) Pulse Rate:  [62-64] 64 (06/06 0636) Resp:  [17] 17 (06/05 1322) BP: (116-132)/(59-71) 116/66 mmHg (06/06 0636) SpO2:  [95 %-97 %] 97 % (06/06 0636) Weight:  [123 lb 12.8 oz (56.155 kg)] 123 lb 12.8 oz (56.155 kg) (06/06 0636)  Intake/Output from previous day: 06/05 0701 - 06/06 0700 In: 120 [P.O.:120] Out: -  Intake/Output from this shift:    Physical Exam: Well appearing elderly woman, NAD HEENT: Unremarkable Neck:  No JVD, no thyromegally Lymphatics:  No adenopathy Back:  No CVA tenderness Lungs:  Clear with no wheezes HEART:  Regular rate rhythm, no murmurs, no rubs, no clicks Abd:  soft, positive bowel sounds, no organomegally, no rebound, no guarding Ext:  2 plus pulses, no edema, no cyanosis, no clubbing Skin:  No rashes no nodules Neuro:  CN II through XII intact, motor grossly intact  Lab Results:  Recent Labs  11/17/12 1331 11/18/12 0545  WBC 2.8* 2.2*  HGB 13.7 13.3  PLT 43* 48*    Recent Labs  11/17/12 0550 11/18/12 0545  NA 132* 132*  K 3.9 4.1  CL 102 101  CO2 23 22  GLUCOSE 97 101*  BUN 13 11  CREATININE 1.11* 1.04    Recent Labs  11/16/12 2208 11/17/12 0550  TROPONINI <0.30 <0.30   Hepatic Function Panel  Recent Labs  11/17/12 0550  PROT 5.3*  ALBUMIN 2.8*  AST 32  ALT 15  ALKPHOS 45  BILITOT 0.7   No results found for this basename: CHOL,  in the last 72 hours No results found for this basename: PROTIME,  in the last 72 hours  Imaging: Dg Chest 2 View  11/17/2012   *RADIOLOGY REPORT*  Clinical Data: Syncope.  CHEST - 2 VIEW  Comparison:  06/29/2007  Findings: Heart and mediastinal contours are within normal limits. No focal opacities or effusions.  No acute bony abnormality. Thoracolumbar scoliosis and degenerative changes.  IMPRESSION: No active cardiopulmonary disease.   Original Report Authenticated By: Charlett Nose, M.D.   Ct Head Wo Contrast  11/16/2012   *RADIOLOGY REPORT*  Clinical Data: Syncope.  CT HEAD WITHOUT CONTRAST  Technique:  Contiguous axial images were obtained from the base of the skull through the vertex without contrast.  Comparison: Brain MRI 05/08/2009.  Findings: There is cortical atrophy and chronic microvascular ischemic change.  No evidence of acute abnormality including infarct, hemorrhage, mass lesion, mass effect, midline shift or abnormal extra-axial fluid collection.  No hydrocephalus or pneumocephalus.  The calvarium is intact.  IMPRESSION: No acute finding.  Atrophy and chronic microvascular ischemic change.   Original Report Authenticated By: Holley Dexter, M.D.    Cardiac Studies: Tele - nsr/sinus brady Echo - normal LV function Assessment/Plan:  1. Syncope, etiology unknown 2. Sinus bradycardia, mild, not likely causing #1 Rec: ok from my perspective to discharge home. Note neutropenia and workup. We will arrrange for a 30 day monitor and outpatient followup with me.  Deania Siguenza,M.D.  LOS: 2 days    Lewayne Bunting 11/18/2012, 8:19 AM

## 2012-11-18 NOTE — Evaluation (Signed)
Physical Therapy Evaluation Patient Details Name: Morgan Weaver MRN: [id number] DOB: July 12, 1928 Today's Date: 11/18/2012 Time: 4098-1191 PT Time Calculation (min): 28 min  PT Assessment / Plan / Recommendation Clinical Impression  Pt presents to the hospital with syncope. Pt lives with her son who is not able to provide physical assistance. Pt was Independent prior to admission with cane. Pt is currently mod. Independent with RW on the unit. Recommend d/c home using RW and follow-up HHPT for activity tolerance and increasing indpendence.    PT Assessment  All further PT needs can be met in the next venue of care    Follow Up Recommendations  Home health PT    Does the patient have the potential to tolerate intense rehabilitation      Barriers to Discharge        Equipment Recommendations  None recommended by PT    Recommendations for Other Services     Frequency      Precautions / Restrictions Precautions Precautions: Fall Restrictions Weight Bearing Restrictions: No   Pertinent Vitals/Pain       Mobility  Bed Mobility Bed Mobility: Supine to Sit Supine to Sit: 6: Modified independent (Device/Increase time);HOB elevated;With rails Transfers Transfers: Sit to Stand;Stand to Sit Sit to Stand: 6: Modified independent (Device/Increase time);With armrests;From bed Stand to Sit: 6: Modified independent (Device/Increase time);To bed Details for Transfer Assistance: initial cues for hand placement using RW, on second trial pt was mod. Indep. Ambulation/Gait Ambulation/Gait Assistance: 5: Supervision;6: Modified independent (Device/Increase time) Ambulation Distance (Feet): 150 Feet Assistive device: Rolling walker;None Ambulation/Gait Assistance Details: mod. indep with RW, Supervision with no AD. Gait Pattern: Step-through pattern;Decreased stride length Gait velocity: decreased General Gait Details: wore sneakers-pt with recent right toe surgery Stairs: No     Exercises     PT Diagnosis:    PT Problem List:   PT Treatment Interventions:     PT Goals    Visit Information  Last PT Received On: 11/18/12    Subjective Data  Subjective: "I feel I can go home" Patient Stated Goal: to return home   Prior Functioning  Home Living Lives With: Son Available Help at Discharge: Family Type of Home: House Home Layout: Other (Comment) (split level) Home Adaptive Equipment: Walker - rolling;Straight cane Prior Function Level of Independence: Independent with assistive device(s) Able to Take Stairs?: Yes Driving: No Vocation: Retired Musician: No difficulties    Copywriter, advertising Arousal/Alertness: Awake/alert Behavior During Therapy: WFL for tasks assessed/performed Overall Cognitive Status: Within Functional Limits for tasks assessed    Extremity/Trunk Assessment Right Lower Extremity Assessment RLE ROM/Strength/Tone: [facility] for tasks assessed Left Lower Extremity Assessment LLE ROM/Strength/Tone: WFL for tasks assessed Trunk Assessment Trunk Assessment: Normal   Balance Balance Balance Assessed: Yes Static Standing Balance Static Standing - Balance Support: Bilateral upper extremity supported Static Standing - Level of Assistance: 6: Modified independent (Device/Increase time)  End of Session PT - End of Session Equipment Utilized During Treatment: Gait belt Activity Tolerance: Patient tolerated treatment well Patient left: in bed;with call bell/phone within reach Nurse Communication: Mobility status  GP     Greggory Stallion 11/18/2012, 12:32 PM

## 2012-11-18 NOTE — Progress Notes (Signed)
Patient evaluated for long-term disease management services with [facility] as a benefit of her [employer] insurance. Patient will receive a post discharge transition of care call and will be evaluated for monthly home visits for assessments and for education. Morgan Weaver reports her son lives with her, Greig Castilla. Left contact information and [facility] Care Management packet with at bedside. Consents signed and confirmed contact information. Appreciative of visit.  Raiford Noble, MSN-Ed, RN,BSN, [facility], [phone]

## 2012-11-18 NOTE — Progress Notes (Signed)
Subjective: ECHO performed yesterday  Patient walked to the bathroom with assistance without any difficulty or syncopal episodes Complains of headache this morning, started around 10 am, located in back of head. Mild. Improving. Patient's son also mentions that she has had a decreased appetite over the past year or so. Has lost weight, he is unsure how much.  Bled from her IV last night.  Objective: Vital signs in last 24 hours: Filed Vitals:   11/17/12 0533 11/17/12 1322 11/17/12 2137 11/18/12 0636  BP: 132/67 132/71 132/59 116/66  Pulse: 58 62 62 64  Temp: 99.2 F (37.3 C) 98.3 F (36.8 C) 98 F (36.7 C) 97.7 F (36.5 C)  TempSrc: Oral Oral Oral Axillary  Resp: 18 17    Weight:    123 lb 12.8 oz (56.155 kg)  SpO2: 99% 95% 97% 97%   Weight change:   Intake/Output Summary (Last 24 hours) at 11/18/12 0730 Last data filed at 11/17/12 0900  Gross per 24 hour  Intake    120 ml  Output      0 ml  Net    120 ml    Physical Exam Blood pressure 116/66, pulse 64, temperature 97.7 F (36.5 C), temperature source Axillary, resp. rate 17, weight 123 lb 12.8 oz (56.155 kg), SpO2 97.00%. General:  No acute distress, alert and oriented x 3, well-appearing CF HEENT:  PERRL, EOMI, moist mucous membranes, Webster in place Cardiovascular:  HR 50s, Regular rhythm, no murmurs, rubs or gallops Respiratory:  Clear to auscultation bilaterally, no wheezes, rales, or rhonchi Abdomen:  Soft, nondistended, nontender, ++ bowel sounds Extremities:  Warm and well-perfused, no edema.  Skin: Warm, dry, no rashes noted today Neuro: Not anxious appearing, no depressed mood, normal affect  Lab Results: Basic Metabolic Panel:  Recent Labs Lab 11/16/12 1222 11/16/12 1749 11/17/12 0550 11/18/12 0545  NA 132*  --  132* 132*  K 3.2*  --  3.9 4.1  CL 100  --  102 101  CO2 23  --  23 22  GLUCOSE 102*  --  97 101*  BUN 14  --  13 11  CREATININE 1.09  --  1.11* 1.04  CALCIUM 7.6*  --  8.2* 8.6  MG  --   1.7  --   --    Liver Function Tests:  Recent Labs Lab 11/17/12 0550  AST 32  ALT 15  ALKPHOS 45  BILITOT 0.7  PROT 5.3*  ALBUMIN 2.8*   CBC:  Recent Labs Lab 11/17/12 1331 11/18/12 0545  WBC 2.8* 2.2*  NEUTROABS 1.3* 1.1*  HGB 13.7 13.3  HCT 39.2 38.5  MCV 94.0 93.0  PLT 43* 48*   Cardiac Enzymes:  Recent Labs Lab 11/16/12 1748 11/16/12 2208 11/17/12 0550  TROPONINI <0.30 <0.30 <0.30   Thyroid Function Tests:  Recent Labs Lab 11/16/12 1749  TSH 1.213   Urinalysis:  Recent Labs Lab 11/16/12 1228  COLORURINE AMBER*  LABSPEC 1.028  PHURINE 5.5  GLUCOSEU NEGATIVE  HGBUR MODERATE*  BILIRUBINUR MODERATE*  KETONESUR 15*  PROTEINUR 30*  UROBILINOGEN 1.0  NITRITE NEGATIVE  LEUKOCYTESUR TRACE*     Micro Results: Recent Results (from the past 240 hour(s))  URINE CULTURE     Status: None   Collection Time    11/16/12 12:28 PM      Result Value Range Status   Specimen Description URINE, RANDOM   Final   Special Requests NONE   Final   Culture  Setup Time 11/16/2012 18:10  Final   Colony Count 85,000 COLONIES/ML   Final   Culture     Final   Value: Multiple bacterial morphotypes present, none predominant. Suggest appropriate recollection if clinically indicated.   Report Status 11/17/2012 FINAL   Final  CULTURE, BLOOD (ROUTINE X 2)     Status: None   Collection Time    11/16/12  5:30 PM      Result Value Range Status   Specimen Description BLOOD RIGHT ARM   Final   Special Requests BOTTLES DRAWN AEROBIC ONLY 5CC   Final   Culture  Setup Time 11/16/2012 22:38   Final   Culture     Final   Value:        BLOOD CULTURE RECEIVED NO GROWTH TO DATE CULTURE WILL BE HELD FOR 5 DAYS BEFORE ISSUING A FINAL NEGATIVE REPORT   Report Status PENDING   Incomplete   Studies/Results: Dg Chest 2 View  11/17/2012   *RADIOLOGY REPORT*  Clinical Data: Syncope.  CHEST - 2 VIEW  Comparison: 06/29/2007  Findings: Heart and mediastinal contours are within normal  limits. No focal opacities or effusions.  No acute bony abnormality. Thoracolumbar scoliosis and degenerative changes.  IMPRESSION: No active cardiopulmonary disease.   Original Report Authenticated By: Charlett Nose, M.D.   Ct Head Wo Contrast  11/16/2012   *RADIOLOGY REPORT*  Clinical Data: Syncope.  CT HEAD WITHOUT CONTRAST  Technique:  Contiguous axial images were obtained from the base of the skull through the vertex without contrast.  Comparison: Brain MRI 05/08/2009.  Findings: There is cortical atrophy and chronic microvascular ischemic change.  No evidence of acute abnormality including infarct, hemorrhage, mass lesion, mass effect, midline shift or abnormal extra-axial fluid collection.  No hydrocephalus or pneumocephalus.  The calvarium is intact.  IMPRESSION: No acute finding.  Atrophy and chronic microvascular ischemic change.   Original Report Authenticated By: Holley Dexter, M.D.   Medications:  Medications reviewed  Scheduled Meds: . cefTRIAXone (ROCEPHIN)  IV  1 g Intravenous Q24H  . levothyroxine  125 mcg Oral QAC breakfast  . sodium chloride  3 mL Intravenous Q12H   Continuous Infusions:   PRN Meds:.acetaminophen, diphenhydrAMINE, ondansetron (ZOFRAN) IV, ondansetron  Assessment/Plan: 79 year old female past medical history of hypertension, dyslipidemia, hypothyroidism who presents after two episodes of syncope at home.   1. Syncope  Unclear etiology. Patient did not have classic prodromal symptoms which often go along with vasovagal syncope. More concern for arrhythmia in this case. Troponins negative, work up unrevealing so far. 6/5: Echo with EF 60-65%, no LVH, no wall motion abnormalities, slightly elevated PAP (36) No further syncopal episodes.  -appreciate cardiology input, agree with event monitor -will get wound consult for scalp laceration -PT consult to walk patient  2. Suspected UTI  -will DC rocephin today as culture with multiple species, 85k  colonies  3. Hypokalemia  3.2 on admission. 4.1 this morning  4. Leukopenia andThrombocytopenia  No previous history of low WBCs or low platelets. Etiology could be infectious (in the history it states patient has a history of lyme disease) or related to sulfa containing drugs (i.e HCTZ), malignancy must be in the differential  Stable, WBC 2.2, Plts 48 today C3 94, C4 20, ESR 5 Likely reactive from acute illness, appreciate hematology input.  -pt will f/u with hematology after dc, may need BM bx to r/o MDS  5. Rash: resolved -no rash seen this morning and patient denies  6. History of HTN  -would recommend avoiding HCTZ in  the future -f/u with PCP for blood pressure check  7. History of hypothyroidism  TSH wnl -cont Synthroid 125 mcg daily   8. F/E/N  -NSL  -regular diet  -pt c/o weight loss, would recommend ensure dietary supplements  9. DVT px  -Lovenox   Code Status: full code   Dispo:  Patient stable, no need for further inpatient work up. May be DCd this afternoon  The patient does have a current PCP Kaleen Mask, MD), therefore will not requiring OPC follow-up after discharge.  -patient will need to make sure she is up to date on age appropriate cancer screening as outpatient    LOS: 2 days   [place] Ar 11/18/2012, 7:30 AM

## 2012-11-18 NOTE — Discharge Summary (Signed)
Internal Medicine Teaching [facility] Discharge Note  Name: Morgan Weaver MRN: [id number] DOB: 1929/06/11 79 y.o.  Date of Admission: 11/16/2012 10:38 AM Date of Discharge: 11/18/2012 Attending Physician: Farley Ly, MD  Discharge Diagnosis: Principal Problem:   Syncope Active Problems:   Acute UTI   Hypertension   Hypothyroid   Rash   Thrombocytopenia, unspecified   Leukopenia   Discharge Medications:   Medication List    STOP taking these medications       hydrochlorothiazide 50 MG tablet  Commonly known as:  HYDRODIURIL      TAKE these medications       levothyroxine 125 MCG tablet  Commonly known as:  SYNTHROID, LEVOTHROID  Take 125 mcg by mouth daily before breakfast. Skip 7th day dose        Disposition and follow-up:   Ms.Aby R Winburn was discharged from [facility] in Stable condition.  At the hospital follow up visit please address the following  -age appropriate cancer screening -cardiology: will set up patient for event monitoring -hematology: further w/u and monitoring of thrombocytopenia and leukocytopenia, possible bone marrow biopsy -BP check with PCP: HCTZ was discontinued during this hospitalization, would avoid diuretics if an agent is needed -f/u on scalp laceration and wound care  Follow-up Appointments:     Follow-up Information   Schedule an appointment as soon as possible for a visit with Kaleen Mask, MD. (please call Dr. Jeannetta Nap to make a hospital follow up appointment next week)    Contact information:   [address] [place] [address] [phone]       Call Lewayne Bunting, MD.   Contact information:   [address] [place] [address] [phone]       Call [facility], MD.   Contact information:   [address] [place] [address] [phone]        Consultations: Treatment Team:  Benjiman Core, MD - Hematology Cardiology  Procedures  Performed:  Dg Chest 2 View  11/17/2012   *RADIOLOGY REPORT*  Clinical Data: Syncope.  CHEST - 2 VIEW  Comparison: 06/29/2007  Findings: Heart and mediastinal contours are within normal limits. No focal opacities or effusions.  No acute bony abnormality. Thoracolumbar scoliosis and degenerative changes.  IMPRESSION: No active cardiopulmonary disease.   Original Report Authenticated By: Charlett Nose, M.D.   Ct Head Wo Contrast  11/16/2012   *RADIOLOGY REPORT*  Clinical Data: Syncope.  CT HEAD WITHOUT CONTRAST  Technique:  Contiguous axial images were obtained from the base of the skull through the vertex without contrast.  Comparison: Brain MRI 05/08/2009.  Findings: There is cortical atrophy and chronic microvascular ischemic change.  No evidence of acute abnormality including infarct, hemorrhage, mass lesion, mass effect, midline shift or abnormal extra-axial fluid collection.  No hydrocephalus or pneumocephalus.  The calvarium is intact.  IMPRESSION: No acute finding.  Atrophy and chronic microvascular ischemic change.   Original Report Authenticated By: Holley Dexter, M.D.    2D Echo: 11/17/12  - Left ventricle: The cavity size was normal. Systolic function was normal. The estimated ejection fraction was in the range of 60% to 65%. Wall motion was normal; there were no regional wall motion abnormalities. - Pulmonary arteries: Systolic pressure was mildly increased. PA peak pressure: 36mm Hg (S).  Left ventricle: The cavity size was normal. Systolic function was normal. The estimated ejection fraction was in the range of 60% to 65%. Wall motion was normal; there  were no regional wall motion abnormalities.  ------------------------------------------------------------ Aortic valve: Trileaflet; normal thickness leaflets. Mobility was not restricted. Doppler: Transvalvular velocity was within the normal range. There was no stenosis. No  regurgitation.  ------------------------------------------------------------ Aorta: Aortic root: The aortic root was normal in size.  ------------------------------------------------------------ Mitral valve: Structurally normal valve. Mobility was not restricted. Doppler: Transvalvular velocity was within the normal range. There was no evidence for stenosis. No regurgitation. Peak gradient: 2mm Hg (D).  ------------------------------------------------------------ Left atrium: The atrium was normal in size.  ------------------------------------------------------------ Right ventricle: Right ventricle imaged off axis. difficult to tell size, but looks mildly dilated. Not able to assess RV size due to poor visualization. Systolic function was normal.  ------------------------------------------------------------ Pulmonic valve: Not well visualized. Doppler: Transvalvular velocity was within the normal range. There was no evidence for stenosis.  ------------------------------------------------------------ Tricuspid valve: Structurally normal valve. Doppler: Transvalvular velocity was within the normal range. Mild regurgitation.  ------------------------------------------------------------ Pulmonary artery: The main pulmonary artery was normal-sized. Systolic pressure was mildly increased.  ------------------------------------------------------------ Right atrium: The atrium was normal in size.  ------------------------------------------------------------ Pericardium: There was no pericardial effusion.  ------------------------------------------------------------ Systemic veins: Inferior vena cava: The vessel was normal in size.   Admission HPI: Patient is an 79 year old woman with history of hypertension, hyperlipidemia, hypothyroidism, and other problems as outlined in the medical history admitted with complaint of loss of consciousness x2 episodes over the past 2 days. The  first episode occurred after she stood up from the toilet and she lost consciousness and fell. His second episode occurred when she was standing at the sink. She denies any premonitory symptoms. She also denies fever, chills, sweats, headache, chest pain, shortness of breath, palpitations, abdominal pain, nausea, vomiting, diarrhea, bright red blood per rectum, or lower extremity edema. She reports that she has had normal oral intake recently. She has no prior history of syncope or seizures. She is on no new medications.   Hospital Course by problem list: Principal Problem:   Syncope Active Problems:   Acute UTI   Hypertension   Hypothyroid   Rash   Thrombocytopenia, unspecified   Leukopenia    Syncope  Patient presented with 2 episodes of syncope, both occurring in the bathroom with scalp lacerations. She denied any prodromal symptoms such as flushing, dizziness, palpitations, changes in vision. She was admitted to the hospital, placed on cardiac telemetry. Troponins were negative. She had mild electrolyte abnormalities including hyponatremia to 132, hypokalemia 3.2 on admission.hemoglobin was 12.6. Urinalysis initially concerning for infection, and she was started on Rocephin, however, this was subsequently discontinued based on culture results. Patient underwent an echocardiogram during this admission for workup of her syncope. This showed an EF of 60-65%, no structural abnormalities, no wall motion abnormalities, slightly elevated PAP. No significant events on telemetry during admission and she did not have any further episodes of syncope. Cardiology was consulted and will set the patient up for outpatient event monitor.  No specific etiology was found for the patient's syncope. She was discharged with HHPT   Leukopenia andThrombocytopenia  The patient does not have a previous history of low WBCs are low platelets, however, she was found to have leukopenia and pancytopenia on admission.  Hematology was consulted and it was thought that this was likely reactive from acute illness. Patient will need to followup with hematology after discharge and may need a bone marrow biopsy to rule out MDS. Complement levels were within normal limits, ESR was 5. On the day of discharge, her white count was 2.2, platelets  48, but stable from previous. She did not have any serious acute bleeding events.  Suspected UTI  Patient's initial urinalysis was consistent for her UTI and she was started on Rocephin. She never had any symptoms, and her urine culture showed 85,000 colonies with multiple species. Rocephin was discontinued.  Hypokalemia  Patient was found to have potassium of 3.2 on admission, which was repleted orally. On the day of discharge, her potassium was 4.1.  Rash Patient was noted to have a rash over her arms and legs on admission. This spontaneously resolved the day after admission on hospital day one. Unclear etiology.  History of HTN  Patient Came into the hospital on HCTZ for hypertension. This was held due to the rash as well as thrombocytopenia and leukopenia as well as concern for orthostasis with her chief complaint is syncope. Patient's blood pressure was monitored during the admission and she was not found to be hypertensive. Therefore, we did not discharge her on any blood pressure medications. She is to followup with her PCP for blood pressure check. Would recommend avoiding HCTZ and other diuretics in this patient. Consider another agent if patient does need an antihypertensive.  History of hypothyroidism  Patient is on home Synthroid. Her TSH was checked during this admission and was found to be within normal limits. She was continued on her home dose of Synthroid.  Decreased appetite and weight loss On the day of discharge, the patient and her son were complaining of decreased appetite as well as weight loss over the past year. We recommended the use of nutritional  supplements such as Ensure milkshakes, as well as ensuring that she is up-to-date on her age-appropriate cancer screening. She has followup with her PCP.   Discharge Vitals:  BP 116/66  Pulse 64  Temp(Src) 97.7 F (36.5 C) (Axillary)  Resp 17  Wt 123 lb 12.8 oz (56.155 kg)  BMI 20.93 kg/m2  SpO2 97%  Discharge Labs:  Results for orders placed during the hospital encounter of 11/16/12 (from the past 24 hour(s))  CBC WITH DIFFERENTIAL     Status: Abnormal   Collection Time    11/17/12  1:31 PM      Result Value Range   WBC 2.8 (*) 4.0 - 10.5 K/uL   RBC 4.17  3.87 - 5.11 MIL/uL   Hemoglobin 13.7  12.0 - 15.0 g/dL   HCT 84.6  96.2 - 95.2 %   MCV 94.0  78.0 - 100.0 fL   MCH 32.9  26.0 - 34.0 pg   MCHC 34.9  30.0 - 36.0 g/dL   RDW 84.1  32.4 - 40.1 %   Platelets 43 (*) 150 - 400 K/uL   Neutrophils Relative % 48  43 - 77 %   Neutro Abs 1.3 (*) 1.7 - 7.7 K/uL   Lymphocytes Relative 41  12 - 46 %   Lymphs Abs 1.2  0.7 - 4.0 K/uL   Monocytes Relative 9  3 - 12 %   Monocytes Absolute 0.3  0.1 - 1.0 K/uL   Eosinophils Relative 0  0 - 5 %   Eosinophils Absolute 0.0  0.0 - 0.7 K/uL   Basophils Relative 1  0 - 1 %   Basophils Absolute 0.0  0.0 - 0.1 K/uL  ANA     Status: None   Collection Time    11/17/12  7:58 PM      Result Value Range   ANA NEGATIVE  NEGATIVE  C3 COMPLEMENT  Status: None   Collection Time    11/17/12  7:58 PM      Result Value Range   C3 Complement 94  90 - 180 mg/dL  C4 COMPLEMENT     Status: None   Collection Time    11/17/12  7:58 PM      Result Value Range   Complement C4, Body Fluid 20  10 - 40 mg/dL  SEDIMENTATION RATE     Status: None   Collection Time    11/17/12  7:58 PM      Result Value Range   Sed Rate 5  0 - 22 mm/hr  CBC WITH DIFFERENTIAL     Status: Abnormal   Collection Time    11/18/12  5:45 AM      Result Value Range   WBC 2.2 (*) 4.0 - 10.5 K/uL   RBC 4.14  3.87 - 5.11 MIL/uL   Hemoglobin 13.3  12.0 - 15.0 g/dL   HCT 47.8   29.5 - 62.1 %   MCV 93.0  78.0 - 100.0 fL   MCH 32.1  26.0 - 34.0 pg   MCHC 34.5  30.0 - 36.0 g/dL   RDW 30.8  65.7 - 84.6 %   Platelets 48 (*) 150 - 400 K/uL   Neutrophils Relative % 49  43 - 77 %   Neutro Abs 1.1 (*) 1.7 - 7.7 K/uL   Lymphocytes Relative 39  12 - 46 %   Lymphs Abs 0.9  0.7 - 4.0 K/uL   Monocytes Relative 9  3 - 12 %   Monocytes Absolute 0.2  0.1 - 1.0 K/uL   Eosinophils Relative 1  0 - 5 %   Eosinophils Absolute 0.0  0.0 - 0.7 K/uL   Basophils Relative 2 (*) 0 - 1 %   Basophils Absolute 0.0  0.0 - 0.1 K/uL  BASIC METABOLIC PANEL     Status: Abnormal   Collection Time    11/18/12  5:45 AM      Result Value Range   Sodium 132 (*) 135 - 145 mEq/L   Potassium 4.1  3.5 - 5.1 mEq/L   Chloride 101  96 - 112 mEq/L   CO2 22  19 - 32 mEq/L   Glucose, Bld 101 (*) 70 - 99 mg/dL   BUN 11  6 - 23 mg/dL   Creatinine, Ser 9.62  0.50 - 1.10 mg/dL   Calcium 8.6  8.4 - 95.2 mg/dL   GFR calc non Af Amer 49 (*) >90 mL/min   GFR calc Af Amer 56 (*) >90 mL/min    Signed: Denton Ar 11/18/2012, 1:07 PM   Time Spent on Discharge: 45 minutes Services Ordered on Discharge: HH PT Equipment Ordered on Discharge: none

## 2012-11-18 NOTE — Progress Notes (Signed)
Internal Medicine Attending  Date: 11/18/2012  Patient name: Morgan Weaver Medical record number: [id number] Date of birth: 31-Jul-1928 Age: 79 y.o. Gender: female  I saw and evaluated the patient on a.m. rounds with house staff. I reviewed the resident's note by Dr. Collier Bullock and I agree with the resident's findings and plans as documented in her note.

## 2012-11-22 LAB — CULTURE, BLOOD (ROUTINE X 2): Culture: NO GROWTH

## 2012-11-23 ENCOUNTER — Encounter (INDEPENDENT_AMBULATORY_CARE_PROVIDER_SITE_OTHER): Payer: Medicare Other

## 2012-11-23 ENCOUNTER — Telehealth: Payer: Self-pay | Admitting: *Deleted

## 2012-11-23 ENCOUNTER — Encounter: Payer: Self-pay | Admitting: *Deleted

## 2012-11-23 DIAGNOSIS — R55 Syncope and collapse: Secondary | ICD-10-CM

## 2012-11-23 DIAGNOSIS — R000 Tachycardia, unspecified: Secondary | ICD-10-CM

## 2012-11-23 NOTE — Telephone Encounter (Signed)
30 Day Event monitor ordered.

## 2012-11-23 NOTE — Progress Notes (Signed)
Patient ID: Morgan Weaver, female   DOB: 1928-10-03, 79 y.o.   MRN: [id number] E-[employer] Bramer 30 day event monitor placed on patient.

## 2013-02-22 ENCOUNTER — Encounter: Payer: Self-pay | Admitting: Neurology

## 2013-02-22 ENCOUNTER — Ambulatory Visit (INDEPENDENT_AMBULATORY_CARE_PROVIDER_SITE_OTHER): Payer: Medicare Other | Admitting: Neurology

## 2013-02-22 VITALS — BP 153/65 | HR 61 | Ht 64.0 in | Wt 124.0 lb

## 2013-02-22 DIAGNOSIS — E538 Deficiency of other specified B group vitamins: Secondary | ICD-10-CM

## 2013-02-22 DIAGNOSIS — R4189 Other symptoms and signs involving cognitive functions and awareness: Secondary | ICD-10-CM

## 2013-02-22 DIAGNOSIS — E519 Thiamine deficiency, unspecified: Secondary | ICD-10-CM

## 2013-02-22 DIAGNOSIS — F09 Unspecified mental disorder due to known physiological condition: Secondary | ICD-10-CM

## 2013-02-22 NOTE — Progress Notes (Signed)
[facility]  Provider:  Dr Hosie Poisson Referring Provider: Kaleen Mask, * Primary Care Physician:  Kaleen Mask, MD  CC: cognitive decline  HPI:  Morgan Weaver is a 79 y.o. female here as a referral from Dr. Jeannetta Nap for cognitive decline  Patient presents today with her son who provides the majority of the history. Per the son she has had some memory trouble for the past few years, they noticed a drastic decline in the past 6-8 weeks. Most notably they noticed trouble with short term memory, increased confusion, disorientation. The son notes that recently she forgot she and owned her own home. Having trouble with basic ADLs. She does fluctuate, centered she has good moments and bad moments. They deny any hallucinations. Reports that long-term memory is still good. Denies any recent strokes TIAs seizures, no head trauma no recent infections or illnesses. Patient has a long history of alcohol use, per the center in several drinks a night. She has not been drinking in the past few weeks though. She was evaluated by her primary care physician and found for B12 of 182. Started on oral B12 for this. Had a head CT in the past few months which showed generalized atrophy but  otherwise unremarkable. Son notes that her physical activity has been decline in the past few months.  Review of Systems: Out of a complete 14 system review, the patient complains of only the following symptoms, and all other reviewed systems are negative. Positive for easy bruising memory loss confusion weakness depression  History   Social History  . Marital Status: Married    Spouse Name: N/A    Number of Children: N/A  . Years of Education: N/A   Occupational History  . Not on file.   Social History Main Topics  . Smoking status: Former Games developer  . Smokeless tobacco: Not on file     Comment: quit-1977  . Alcohol Use: 8.4 oz/week    14 Glasses of wine per week     Comment: BEFORE DINNER   . Drug Use: No  . Sexual Activity:    Other Topics Concern  . 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 Heart disease Father     heart attack  . Hypertension Father   . Other Son     brain tumor     Past Medical History  Diagnosis Date  . Torn rotator cuff   . Ovarian cyst   . Lyme disease   . Hypertension   . Hemorrhoid   . Arthritis   . Weight decrease   . Hyperlipidemia   . Thyroid disease     hypothyroidism    Past Surgical History  Procedure Laterality Date  . Appendectomy  1952  . Rotator cuff repair  1989    left  . Oophorectomy  1955    removed due to the ovary having gangrene  . Abdominal hysterectomy  1970  . Knee arthroscopy    . Tonsillectomy    . Other surgical history      left foot surgery  . Other surgical history  right foot surgery    Current Outpatient Prescriptions  Medication Sig Dispense Refill  . donepezil (ARICEPT) 5 MG tablet Take 5 mg by mouth at bedtime as needed.      Marland Kitchen levothyroxine (SYNTHROID, LEVOTHROID) 125 MCG tablet Take 125 mcg by mouth daily before breakfast. Skip 7th day dose      . vitamin B-12 (CYANOCOBALAMIN) 1000 MCG tablet Take 1,000 mcg by mouth daily.       No current facility-administered medications for this visit.    Allergies as of 02/22/2013 - Review Complete 11/16/2012  Allergen Reaction Noted  . Mobic [meloxicam]  11/16/2012  . Sulfa drugs cross reactors  10/17/2010  . Tetracyclines & related Swelling 10/17/2010    Vitals: BP 153/65  Pulse 61  Ht 5\' 4"  (1.626 m)  Wt 124 lb (56.246 kg)  BMI 21.27 kg/m2 Last Weight:  Wt Readings from Last 1 Encounters:  02/22/13 124 lb (56.246 kg)   Last Height:   Ht Readings from Last 1 Encounters:   02/22/13 5\' 4"  (1.626 m)     Physical exam: Exam: Gen: NAD, conversant Eyes: anicteric sclerae, moist conjunctivae HENT: [place]raum[place] Neck: Trachea midline; supple,  Lungs: CTA, no wheezing, rales, rhonic                          CV: RRR, no MRG Abdomen: Soft, non-tender;  Extremities: No peripheral edema  Skin: Normal temperature, no rash,  Psych: Appropriate affect, pleasant  Neuro: MS:  MOCA 10/30 -3 executive -1 naming -1 attention -3 serial 7s -1 language -1 abstraction -5 delayed recall -5 orientation  CN: PERRL, impaired vertical gaze,  no nystagmus, no ptosis, frequent blinking noted, sensation intact to LT V1-V3 bilat, face symmetric, no weakness, hearing grossly intact, palate elevates symmetrically, shoulder shrug 5/5 bilat,  tongue protrudes midline, no fasiculations noted.  Motor: normal bulk and tone Strength: Moves all extremities symmetrically   Coord: rapid alternating and point-to-point (FNF, HTS) movements intact.No tremor noted at rest or with action/posture  Reflexes: symmetrical, bilat downgoing toes  Sens: LT intact in all extremities  Gait:upright, slow, slight shuffling, mild wide based, unable to tandem   Assessment:  After physical and neurologic examination, review of laboratory studies, imaging, neurophysiology testing and pre-existing records, assessment will be reviewed on the problem list.  Plan:  Treatment plan and additional workup will be reviewed under Problem List.  [name] is a pleasant 79y/o woman who presents with her son for initial evaluation of cognitive decline. Per the son she has had a few years memory decline with a notable rapid decline in the past 6-8 weeks. She has marked difficulty with short-term memory, confusion, disorientation. Denies any hallucinations. Her exam is most pertinent for aMOCA score of 10/30. Lab workup per primary care physician was pertinent for a B12 of 182. Per the son patient does have a  history of alcohol use, drinking 2-3 drinks per night. Suspect cognitive decline may be attributed to B12 deficiency, though unsure what precipitated acute decline the past 6-8 weeks. Will order MRI of the brain to check for structural lesion. Per discussion with son will consider LP and/or EEG in the future if MRI unremarkable. Patient instructed to switch to B12 injections and sent for laboratory for thiamine and folic acid checked. Will continue on Aricept 5 mg daily at this time, can consider increase to 10 mg the future as tolerated.  1)Cognitive decline 2)B12 deficiency  -switch to B12 injections (daily  for 5 days, then weekly for 4 weeks, then monthly), re-check level in 6 months -MRI brain -check thiamine and folic acid -continue Aricept 5mg , titrate up as tolerated -follow up in 4 to 6 months -son states patient will follow up with PCP for B12 injections

## 2013-02-22 NOTE — Patient Instructions (Signed)
Overall you are doing fairly well but I do want to suggest a few things today:   Remember to drink plenty of fluid, eat healthy meals and do not skip any meals. Try to eat protein with a every meal and eat a healthy snack such as fruit or nuts in between meals. Try to keep a regular sleep-wake schedule and try to exercise daily, particularly in the form of walking, 20-30 minutes a day, if you can.   As far as your medications are concerned, I would like to suggest you switch to B12 injections using the following schedule: - daily for 5 days, then - once weekly for 4 weeks, then - once a month  We will check Folic acid and thiamine and order a brain MRI today  Continue on the Aricept for now.  We will see you back in 4 to 6 months, sooner if we need to. Please call us with any interim questions, concerns, problems, updates or refill requests.   Please also call us for any test results so we can go over those with you on the phone.  My clinical assistant and will answer any of your questions and relay your messages to me and also relay most of my messages to you.   Our phone number is [phone]. We also have an after hours call service for urgent matters and there is a physician on-call for urgent questions. For any emergencies you know to call 911 or go to the nearest emergency room

## 2013-02-23 ENCOUNTER — Telehealth: Payer: Self-pay | Admitting: Neurology

## 2013-02-23 ENCOUNTER — Other Ambulatory Visit: Payer: Self-pay | Admitting: Neurology

## 2013-02-23 MED ORDER — CYANOCOBALAMIN 1000 MCG/ML IJ SOLN: 1000.0000 ug | Freq: Once | INTRAMUSCULAR | Status: DC

## 2013-02-23 NOTE — Telephone Encounter (Signed)
Patient needs B-12.

## 2013-02-23 NOTE — Telephone Encounter (Signed)
Can we please schedule her to come in and receive B12 injections. She will need subq for 5 days, then subq once weekly for 4 weeks. Thanks.

## 2013-02-24 NOTE — Telephone Encounter (Signed)
Forwarded request to RN- Joellen Jersey. to schedule B12 injections.

## 2013-02-26 LAB — FOLATE RBC
Folate, Hemolysate: 312.4 ng/mL
HCT: 40.3 % (ref 34.0–46.6)

## 2013-03-02 ENCOUNTER — Ambulatory Visit
Admission: RE | Admit: 2013-03-02 | Discharge: 2013-03-02 | Disposition: A | Discharge status: Home / Self Care | Payer: Medicare Other | Source: Ambulatory Visit | Attending: Neurology | Admitting: Neurology

## 2013-03-02 DIAGNOSIS — R4189 Other symptoms and signs involving cognitive functions and awareness: Secondary | ICD-10-CM

## 2013-03-02 DIAGNOSIS — R413 Other amnesia: Secondary | ICD-10-CM

## 2013-03-06 ENCOUNTER — Other Ambulatory Visit: Payer: Self-pay | Admitting: Neurology

## 2013-03-06 ENCOUNTER — Telehealth: Payer: Self-pay | Admitting: Neurology

## 2013-03-06 MED ORDER — DONEPEZIL HCL 10 MG PO TABS: 10.0000 mg | ORAL_TABLET | Freq: Every day | ORAL | Status: DC

## 2013-03-06 NOTE — Telephone Encounter (Signed)
Son called back, discussed results. Will increase Aricept to 10mg . Will continue with B12 injections

## 2013-03-06 NOTE — Telephone Encounter (Signed)
Patient son calling wanting MRI and lab results. Please advise.

## 2013-03-16 ENCOUNTER — Inpatient Hospital Stay (HOSPITAL_COMMUNITY)
Admission: EM | Admit: 2013-03-16 | Discharge: 2013-03-20 | DRG: 470 | Disposition: A | Discharge status: Skilled Nursing Facility | Payer: Medicare Other | Attending: Family Medicine | Admitting: Family Medicine

## 2013-03-16 ENCOUNTER — Emergency Department (HOSPITAL_COMMUNITY): Payer: Medicare Other

## 2013-03-16 ENCOUNTER — Encounter (HOSPITAL_COMMUNITY): Payer: Self-pay | Admitting: Emergency Medicine

## 2013-03-16 DIAGNOSIS — D696 Thrombocytopenia, unspecified: Secondary | ICD-10-CM

## 2013-03-16 DIAGNOSIS — N390 Urinary tract infection, site not specified: Secondary | ICD-10-CM

## 2013-03-16 DIAGNOSIS — Z8249 Family history of ischemic heart disease and other diseases of the circulatory system: Secondary | ICD-10-CM

## 2013-03-16 DIAGNOSIS — L290 Pruritus ani: Secondary | ICD-10-CM

## 2013-03-16 DIAGNOSIS — S72002A Fracture of unspecified part of neck of left femur, initial encounter for closed fracture: Secondary | ICD-10-CM

## 2013-03-16 DIAGNOSIS — Z882 Allergy status to sulfonamides status: Secondary | ICD-10-CM

## 2013-03-16 DIAGNOSIS — I10 Essential (primary) hypertension: Secondary | ICD-10-CM | POA: Diagnosis present

## 2013-03-16 DIAGNOSIS — D62 Acute posthemorrhagic anemia: Secondary | ICD-10-CM | POA: Diagnosis not present

## 2013-03-16 DIAGNOSIS — W19XXXA Unspecified fall, initial encounter: Secondary | ICD-10-CM | POA: Diagnosis present

## 2013-03-16 DIAGNOSIS — F0390 Unspecified dementia without behavioral disturbance: Secondary | ICD-10-CM | POA: Diagnosis present

## 2013-03-16 DIAGNOSIS — R21 Rash and other nonspecific skin eruption: Secondary | ICD-10-CM | POA: Diagnosis present

## 2013-03-16 DIAGNOSIS — E039 Hypothyroidism, unspecified: Secondary | ICD-10-CM | POA: Diagnosis present

## 2013-03-16 DIAGNOSIS — E871 Hypo-osmolality and hyponatremia: Secondary | ICD-10-CM

## 2013-03-16 DIAGNOSIS — E785 Hyperlipidemia, unspecified: Secondary | ICD-10-CM | POA: Diagnosis present

## 2013-03-16 DIAGNOSIS — Z79899 Other long term (current) drug therapy: Secondary | ICD-10-CM

## 2013-03-16 DIAGNOSIS — S72002S Fracture of unspecified part of neck of left femur, sequela: Secondary | ICD-10-CM

## 2013-03-16 DIAGNOSIS — R55 Syncope and collapse: Secondary | ICD-10-CM

## 2013-03-16 DIAGNOSIS — Z7901 Long term (current) use of anticoagulants: Secondary | ICD-10-CM

## 2013-03-16 DIAGNOSIS — Z881 Allergy status to other antibiotic agents status: Secondary | ICD-10-CM

## 2013-03-16 DIAGNOSIS — D72819 Decreased white blood cell count, unspecified: Secondary | ICD-10-CM

## 2013-03-16 DIAGNOSIS — Y93H2 Activity, gardening and landscaping: Secondary | ICD-10-CM

## 2013-03-16 DIAGNOSIS — Z9089 Acquired absence of other organs: Secondary | ICD-10-CM

## 2013-03-16 DIAGNOSIS — S72009A Fracture of unspecified part of neck of unspecified femur, initial encounter for closed fracture: Principal | ICD-10-CM

## 2013-03-16 DIAGNOSIS — Y92009 Unspecified place in unspecified non-institutional (private) residence as the place of occurrence of the external cause: Secondary | ICD-10-CM

## 2013-03-16 DIAGNOSIS — Z87891 Personal history of nicotine dependence: Secondary | ICD-10-CM

## 2013-03-16 HISTORY — PX: HIP ARTHROPLASTY: SHX981

## 2013-03-16 LAB — CBC WITH DIFFERENTIAL/PLATELET
Hemoglobin: 12.1 g/dL (ref 12.0–15.0)
Lymphocytes Relative: 33 % (ref 12–46)
Lymphs Abs: 2.5 10*3/uL (ref 0.7–4.0)
Monocytes Relative: 7 % (ref 3–12)
Neutrophils Relative %: 59 % (ref 43–77)
Platelets: 157 10*3/uL (ref 150–400)
RBC: 3.87 MIL/uL (ref 3.87–5.11)
WBC: 7.5 10*3/uL (ref 4.0–10.5)

## 2013-03-16 LAB — BASIC METABOLIC PANEL
BUN: 22 mg/dL (ref 6–23)
CO2: 20 mEq/L (ref 19–32)
Creatinine, Ser: 1.03 mg/dL (ref 0.50–1.10)
GFR calc Af Amer: 57 mL/min — ABNORMAL LOW (ref >90)
GFR calc non Af Amer: 49 mL/min — ABNORMAL LOW (ref >90)
Glucose, Bld: 91 mg/dL (ref 70–99)
Potassium: 3.4 mEq/L — ABNORMAL LOW (ref 3.5–5.1)

## 2013-03-16 LAB — URINALYSIS, ROUTINE W REFLEX MICROSCOPIC
Bilirubin Urine: NEGATIVE
Glucose, UA: NEGATIVE mg/dL
Hgb urine dipstick: NEGATIVE
Nitrite: NEGATIVE
Protein, ur: NEGATIVE mg/dL
Specific Gravity, Urine: 1.025 (ref 1.005–1.030)
Urobilinogen, UA: 0.2 mg/dL (ref 0.0–1.0)

## 2013-03-16 MED ORDER — LEVOTHYROXINE SODIUM 125 MCG PO TABS
125.0000 ug | ORAL_TABLET | Freq: Every day | ORAL | Status: DC
  Administered 2013-03-17 – 2013-03-20 (×4): 125 ug via ORAL
  Filled 2013-03-16 (×5): qty 1

## 2013-03-16 MED ORDER — MORPHINE SULFATE 2 MG/ML IJ SOLN
0.5000 mg | INTRAMUSCULAR | Status: DC | PRN
  Administered 2013-03-17: 0.5 mg via INTRAVENOUS
  Filled 2013-03-16: qty 1

## 2013-03-16 MED ORDER — VITAMIN B-12 1000 MCG PO TABS
1000.0000 ug | ORAL_TABLET | Freq: Every day | ORAL | Status: DC
  Administered 2013-03-17 – 2013-03-20 (×4): 1000 ug via ORAL
  Filled 2013-03-16 (×4): qty 1

## 2013-03-16 MED ORDER — HYDROCODONE-ACETAMINOPHEN 5-325 MG PO TABS
1.0000 | ORAL_TABLET | Freq: Four times a day (QID) | ORAL | Status: DC | PRN
  Administered 2013-03-16: 2 via ORAL
  Filled 2013-03-16: qty 2

## 2013-03-16 MED ORDER — ONDANSETRON HCL 4 MG/2ML IJ SOLN
4.0000 mg | Freq: Once | INTRAMUSCULAR | Status: AC
  Administered 2013-03-16: 4 mg via INTRAVENOUS
  Filled 2013-03-16: qty 2

## 2013-03-16 MED ORDER — MORPHINE SULFATE 4 MG/ML IJ SOLN
4.0000 mg | Freq: Once | INTRAMUSCULAR | Status: AC
  Administered 2013-03-16: 4 mg via INTRAVENOUS
  Filled 2013-03-16: qty 1

## 2013-03-16 MED ORDER — DONEPEZIL HCL 10 MG PO TABS
10.0000 mg | ORAL_TABLET | Freq: Every day | ORAL | Status: DC
  Administered 2013-03-18 – 2013-03-19 (×3): 10 mg via ORAL
  Filled 2013-03-16 (×4): qty 1

## 2013-03-16 MED ORDER — SODIUM CHLORIDE 0.9 % IV SOLN
INTRAVENOUS | Status: DC
  Administered 2013-03-16: 21:00:00 via INTRAVENOUS

## 2013-03-16 NOTE — ED Notes (Addendum)
Pt presents to ED via [facility] complaining of left hip pain onset this afternoon.  Pt was walking in driveway and fell- denies any other injuries, denies LOC.  Pt received 250 Fentanyl en route for pain.

## 2013-03-16 NOTE — ED Provider Notes (Signed)
Medical screening examination/treatment/procedure(s) were conducted as a shared visit with non-physician practitioner(s) and myself.  I personally evaluated the patient during the encounter  79 year old female presenting after a fall with left hip pain. On exam, left hip shortened and externally rotated. Tender to palpation. 2+ distal pulses, intact sensation and motor. X-rays showed displaced left femoral neck fracture. Orthopedics consult and an internal medicine admission planned.  Clinical Impression: 1. Closed left hip fracture, initial encounter       Candyce Churn, MD 03/17/13 0021

## 2013-03-16 NOTE — ED Notes (Signed)
Checked on Patient's O2 Sat. Had fallen to 80 on the main monitor. Advised patient to take nice deep breaths, in through the nose out through the mouth. Saturation level recovered to 100% rapidly.

## 2013-03-16 NOTE — ED Provider Notes (Signed)
CSN: [id number]     Arrival date & time 03/16/13  1830 History   First MD Initiated Contact with Patient 03/16/13 1837     Chief Complaint  Patient presents with  . Fall   (Consider location/radiation/quality/duration/timing/severity/associated sxs/prior Treatment) HPI  Morgan Weaver is a 79 y.o. female complaining of left hip and left knee pain s/p mechanical trip and fall while pulling up a bush. Pt denies head trauma, LOC, cervicalgia, CP, SOB. Abd pain, numbness weakness and paresthesia. Pain in the hip is severe exacerbated by palpation radiated down the left leg to the knee. She [place] unable to weight bear. Denies anticoagulation  Past Medical History  Diagnosis Date  . Torn rotator cuff   . Ovarian cyst   . Lyme disease   . Hypertension   . Hemorrhoid   . Arthritis   . Weight decrease   . Hyperlipidemia   . Thyroid disease     hypothyroidism   Past Surgical History  Procedure Laterality Date  . Appendectomy  1952  . Rotator cuff repair  1989    left  . Oophorectomy  1955    removed due to the ovary having gangrene  . Abdominal hysterectomy  1970  . Knee arthroscopy    . Tonsillectomy    . Other surgical history      left foot surgery  . Other surgical history      right foot surgery   Family History  Problem Relation Age of Onset  . Heart disease Father     heart attack  . Hypertension Father   . Other Son     brain tumor    History  Substance Use Topics  . Smoking status: Former Games developer  . Smokeless tobacco: Never Used     Comment: quit-1977  . Alcohol Use: 8.4 oz/week    14 Glasses of wine per week     Comment: BEFORE DINNER   OB History   Grav Para Term Preterm Abortions TAB SAB Ect Mult Living                 Review of Systems 10 systems reviewed and found to be negative, except as noted in the HPI  Allergies  Mobic; Sulfa drugs cross reactors; and Tetracyclines & related  Home Medications   Current Outpatient Rx  Name  Route  Sig   Dispense  Refill  . cyanocobalamin (,VITAMIN B-12,) 1000 MCG/ML injection   Subcutaneous   Inject 1 mL (1,000 mcg total) into the skin once. injection once daily x 5 days, then once weekly for 4 weeks, then once a month   1 mL   10   . donepezil (ARICEPT) 10 MG tablet   Oral   Take 1 tablet (10 mg total) by mouth at bedtime.   30 tablet   3   . levothyroxine (SYNTHROID, LEVOTHROID) 125 MCG tablet   Oral   Take 125 mcg by mouth daily before breakfast. Skip 7th day dose         . vitamin B-12 (CYANOCOBALAMIN) 1000 MCG tablet   Oral   Take 1,000 mcg by mouth daily.          BP 210/85  Pulse 63  Temp(Src) 97.5 F (36.4 C) (Oral)  Resp 18  Ht 5\' 2"  (1.575 m)  SpO2 99% Physical Exam  Nursing note and vitals reviewed. Constitutional: She is oriented to person, place, and time. She appears well-developed and well-nourished.  In acute pain  HENT:  Head: Normocephalic and atraumatic.  Mouth/Throat: Oropharynx is clear and moist.  Eyes: Conjunctivae and EOM are normal. Pupils are equal, round, and reactive to light.  Neck: Normal range of motion. Neck supple.  Cardiovascular: Normal rate, regular rhythm and intact distal pulses.   Pulmonary/Chest: Effort normal and breath sounds normal. No stridor. No respiratory distress. She has no wheezes. She has no rales. She exhibits no tenderness.  Abdominal: Soft. She exhibits no distension and no mass. There is no tenderness. There is no rebound and no guarding.  Musculoskeletal: Normal range of motion.  +TTP of left hip, sacrum and proximal thigh. neurovascularly intact, knee exam with out deformity or TTP  Neurological: She is alert and oriented to person, place, and time.  Psychiatric: She has a normal mood and affect.    ED Course  Procedures (including critical care time) Labs Review Labs Reviewed  CBC WITH DIFFERENTIAL - Abnormal; Notable for the following:    HCT 34.3 (*)    All other components within normal  limits  BASIC METABOLIC PANEL - Abnormal; Notable for the following:    Potassium 3.4 (*)    GFR calc non Af Amer 49 (*)    GFR calc Af Amer 57 (*)    All other components within normal limits  URINALYSIS, ROUTINE W REFLEX MICROSCOPIC - Abnormal; Notable for the following:    Ketones, ur 15 (*)    All other components within normal limits   Imaging Review Dg Hip 1 View Left  03/16/2013   CLINICAL DATA:  Larey Seat. Injured left hip.  EXAM: LEFT HIP - 1 VIEW:  COMPARISON:  None.  FINDINGS: There is a displaced femoral neck fracture with lateral displacement. The visualized bony pelvis is intact.  IMPRESSION: Displaced left femoral neck fracture.   Electronically Signed   By: Loralie Champagne M.D.   On: 03/16/2013 19:30   Dg Knee Left Port  03/16/2013   CLINICAL DATA:  Larey Seat. Injured left knee.  EXAM: PORTABLE LEFT KNEE - 1-2 VIEW  COMPARISON:  9  FINDINGS: Moderate tricompartmental degenerative changes most notable in the medial compartment. No acute fracture or osteochondral abnormality. Irregularity along the inferior pole of the patella. This appears fairly well corticated and could be an old avulsion injury or remote osteochondral lesion. No joint effusion.  IMPRESSION: Tricompartmental degenerative changes.  Bony density off the lower pole of the patella is most likely a remote avulsion fracture but recommend correlation with clinical findings.   Electronically Signed   By: Loralie Champagne M.D.   On: 03/16/2013 19:33    Date: 03/17/2013  Rate: 56  Rhythm: sinus bradycardia  QRS Axis: normal  Intervals: normal  ST/T Wave abnormalities: normal  Conduction Disutrbances:none  Narrative Interpretation:   Old EKG Reviewed: none available    MDM   1. Closed left hip fracture, initial encounter     Filed Vitals:   03/16/13 1831 03/16/13 1945 03/16/13 2015  BP: 210/85 167/67 156/70  Pulse: 63 57 59  Temp: 97.5 F (36.4 C)    TempSrc: Oral    Resp: 18 17 16   Height: 5\' 2"  (1.575 m)     SpO2: 99% 100% 100%     Morgan Weaver is a 79 y.o. female with mechanical fall and left hip femoral neck fracture. Neurovascularly intact. Pt sees orthopedist at [facility] orthopedic, cannot remember his name.  Ortho Consult from Dr. Shon Baton appreciated: he will eval the Pt in the AM. Pt will be admited to med surg  bed by Dr. Rolland Bimler.   Medications  HYDROcodone-acetaminophen (NORCO/VICODIN) 5-325 MG per tablet 1-2 tablet (2 tablets Oral Given 03/16/13 2252)  morphine 2 MG/ML injection 0.5 mg (not administered)  0.9 %  sodium chloride infusion ( Intravenous New Bag/Given 03/16/13 2103)  levothyroxine (SYNTHROID, LEVOTHROID) tablet 125 mcg (not administered)  vitamin B-12 (CYANOCOBALAMIN) tablet 1,000 mcg (not administered)  donepezil (ARICEPT) tablet 10 mg (not administered)  morphine 4 MG/ML injection 4 mg (4 mg Intravenous Given 03/16/13 1850)  ondansetron (ZOFRAN) injection 4 mg (4 mg Intravenous Given 03/16/13 1850)  morphine 4 MG/ML injection 4 mg (4 mg Intravenous Given 03/16/13 2102)    Note: Portions of this report may have been transcribed using voice recognition software. Every effort was made to ensure accuracy; however, inadvertent computerized transcription errors may be present     Wynetta Emery, PA-C 03/17/13 0102

## 2013-03-16 NOTE — Consult Note (Signed)
ELKINS,WILSON OLIVER, MD Chief Complaint: CTSP for hip fx - patient requested [facility] History: Morgan Weaver is a 79 y.o. female who suffered a mechanical fall (no LOC), today while working in her garden. Unfortunately the patient was unable to stand up after the fall, and trying to do so caused excruciating left hip pain. Pain is worse with movement, better at rest and severe in intensity, located in L hip. She was therefore brought to the ED where she was found to have a displaced L hip fracture. Hospitalist has been asked to admit  Past Medical History  Diagnosis Date  . Torn rotator cuff   . Ovarian cyst   . Lyme disease   . Hypertension   . Hemorrhoid   . Arthritis   . Weight decrease   . Hyperlipidemia   . Thyroid disease     hypothyroidism    Allergies  Allergen Reactions  . Mobic [Meloxicam]     Abdominal pain, ?GI ulcer    . Sulfa Drugs Cross Reactors     unknown  . Tetracyclines & Related Swelling    Lips swell When asked 6/4 patient states unknown reaction    No current facility-administered medications on file prior to encounter.   Current Outpatient Prescriptions on File Prior to Encounter  Medication Sig Dispense Refill  . cyanocobalamin (,VITAMIN B-12,) 1000 MCG/ML injection Inject 1 mL (1,000 mcg total) into the skin once. 1000mcg injection once daily x 5 days, then once weekly for 4 weeks, then once a month  1 mL  10  . donepezil (ARICEPT) 10 MG tablet Take 1 tablet (10 mg total) by mouth at bedtime.  30 tablet  3  . levothyroxine (SYNTHROID, LEVOTHROID) 125 MCG tablet Take 125 mcg by mouth daily before breakfast.       . vitamin B-12 (CYANOCOBALAMIN) 1000 MCG tablet Take 1,000 mcg by mouth daily.        Physical Exam: Filed Vitals:   03/16/13 2230  BP: 131/77  Pulse: 71  Temp: 99.1 F (37.3 C)  Resp:   No SOB/CP Abd soft/NT NVI  Compartments soft/NT No knee pain with palpation/rom Left hip pain with ROM No laceration/abrasion  noted   Image: Dg Hip 1 View Left  03/16/2013   CLINICAL DATA:  Fell. Injured left hip.  EXAM: LEFT HIP - 1 VIEW:  COMPARISON:  None.  FINDINGS: There is a displaced femoral neck fracture with lateral displacement. The visualized bony pelvis is intact.  IMPRESSION: Displaced left femoral neck fracture.   Electronically Signed   By: Mark  Gallerani M.D.   On: 03/16/2013 19:30   Mr Brain Wo Contrast  03/02/2013   [facility]  NEUROIMAGING REPORT   STUDY DATE: 03/02/13 PATIENT NAME: Morgan Weaver DOB: 12/22/1928 MRN: [id number]  ORDERING CLINICIAN: Peter Sumner, DO  CLINICAL HISTORY: 79 year old female with memory loss.  EXAM: MRI brain (without)  TECHNIQUE: MRI of the brain without contrast was obtained utilizing 5 mm  axial slices with T1, T2, T2 flair, SWI and diffusion weighted views.  T1  sagittal and T2 coronal views were obtained. CONTRAST: no IMAGING SITE: [facility] [address] (3 Tesla MRI)   FINDINGS:  No abnormal lesions are seen on diffusion-weighted views to suggest acute  ischemia. The cortical sulci, fissures and cisterns are notable for  moderate diffuse atrophy. Lateral, third and fourth ventricle are  moderately enlarged. No extra-axial fluid collections are seen. No  evidence of mass effect or midline shift.    Moderate periventricular,  subcortical, and pontine chronic small vessel ischemic disease.  On sagittal views the posterior fossa, pituitary gland and corpus callosum  are unremarkable. No evidence of intracranial hemorrhage on SWI views. The  orbits and their contents, paranasal sinuses and calvarium are notable for  post-surgical orbits.  Intracranial flow voids are present.   03/02/2013   Abnormal MRI brain (without) demonstrating: 1. Moderate cortical and subcortical atrophy. 2. Moderate chronic small vessel ischemic disease. 3. Compared to prior MRI on 05/08/09, there has been some progression of  atrophy and chronic small vessel ischemic disease.    INTERPRETING PHYSICIAN:  VIKRAM R. PENUMALLI, MD Certified in Neurology, Neurophysiology and Neuroimaging  [facility] [address], [address] [place], [place] [address] [phone]   Dg Knee Left Port  03/16/2013   CLINICAL DATA:  Fell. Injured left knee.  EXAM: PORTABLE LEFT KNEE - 1-2 VIEW  COMPARISON:  9  FINDINGS: Moderate tricompartmental degenerative changes most notable in the medial compartment. No acute fracture or osteochondral abnormality. Irregularity along the inferior pole of the patella. This appears fairly well corticated and could be an old avulsion injury or remote osteochondral lesion. No joint effusion.  IMPRESSION: Tricompartmental degenerative changes.  Bony density off the lower pole of the patella is most likely a remote avulsion fracture but recommend correlation with clinical findings.   Electronically Signed   By: Mark  Gallerani M.D.   On: 03/16/2013 19:33    A/P:  PAtient s/p mechanical fall with left femoral neck fracture.  No other injury noted Closed injury.   Medically cleared for surgery Will require hemiarthroplasty Will discuss with my partner as I do not do this type of surgery Continue NPO for now - if unable to go to OR Friday will notify nsg. 

## 2013-03-16 NOTE — H&P (Signed)
[facility] History and Physical  ADONIS RYTHER ZOX:[id number] DOB: 08/17/1928 DOA: 03/16/2013  Referring physician: ED PCP: Kaleen Mask, MD  Chief Complaint: Fall, Hip pain  HPI: Morgan Weaver is a 79 y.o. female who suffered a mechanical fall (no LOC), today while working in her garden.  Unfortunately the patient was unable to stand up after the fall, and trying to do so caused excruciating left hip pain.  Pain is worse with movement, better at rest and severe in intensity, located in L hip.  She was therefore brought to the ED where she was found to have a displaced L hip fracture.  Hospitalist has been asked to admit and Dr. Ophelia Charter will see patient in AM for ortho.  Review of Systems: 12 systems reviewed and otherwise negative.  Past Medical History  Diagnosis Date  . Torn rotator cuff   . Ovarian cyst   . Lyme disease   . Hypertension   . Hemorrhoid   . Arthritis   . Weight decrease   . Hyperlipidemia   . Thyroid disease     hypothyroidism   Past Surgical History  Procedure Laterality Date  . Appendectomy  1952  . Rotator cuff repair  1989    left  . Oophorectomy  1955    removed due to the ovary having gangrene  . Abdominal hysterectomy  1970  . Knee arthroscopy    . Tonsillectomy    . Other surgical history      left foot surgery  . Other surgical history      right foot surgery   Social History:  reports that she has quit smoking. She has never used smokeless tobacco. She reports that she drinks about 8.4 ounces of alcohol per week. She reports that she does not use illicit drugs.   Allergies  Allergen Reactions  . Mobic [Meloxicam]     Abdominal pain, ?GI ulcer    . Sulfa Drugs Cross Reactors     unknown  . Tetracyclines & Related Swelling    Lips swell When asked 6/4 patient states unknown reaction    Family History  Problem Relation Age of Onset  . Heart disease Father     heart attack  . Hypertension Father   . Other Son    brain tumor     Prior to Admission medications   Medication Sig Start Date End Date Taking? Authorizing Provider  cyanocobalamin (,VITAMIN B-12,) 1000 MCG/ML injection Inject 1 mL (1,000 mcg total) into the skin once. injection once daily x 5 days, then once weekly for 4 weeks, then once a month 02/23/13  Yes Omelia Blackwater, DO  donepezil (ARICEPT) 10 MG tablet Take 1 tablet (10 mg total) by mouth at bedtime. 03/06/13  Yes Omelia Blackwater, DO  levothyroxine (SYNTHROID, LEVOTHROID) 125 MCG tablet Take 125 mcg by mouth daily before breakfast.    Yes Historical Provider, MD  vitamin B-12 (CYANOCOBALAMIN) 1000 MCG tablet Take 1,000 mcg by mouth daily.   Yes Historical Provider, MD   Physical Exam: Filed Vitals:   03/16/13 2115  BP: 173/73  Pulse: 64  Temp:   Resp: 17    General:  NAD, resting comfortably in bed Eyes: PEERLA EOMI ENT: mucous membranes moist Neck: supple w/o JVD Cardiovascular: RRR w/o MRG Respiratory: CTA B Abdomen: soft, nt, nd, bs+ Skin: no rash nor lesion Musculoskeletal: MAE, significant pain in L hip with any movement Psychiatric: normal tone and affect Neurologic: AAOx3, grossly non-focal  Labs  on Admission:  Basic Metabolic Panel:  Recent Labs Lab 03/16/13 1934  NA 139  K 3.4*  CL 105  CO2 20  GLUCOSE 91  BUN 22  CREATININE 1.03  CALCIUM 8.9   Liver Function Tests: No results found for this basename: AST, ALT, ALKPHOS, BILITOT, PROT, ALBUMIN,  in the last 168 hours No results found for this basename: LIPASE, AMYLASE,  in the last 168 hours No results found for this basename: AMMONIA,  in the last 168 hours CBC:  Recent Labs Lab 03/16/13 1934  WBC 7.5  NEUTROABS 4.4  HGB 12.1  HCT 34.3*  MCV 88.6  PLT 157   Cardiac Enzymes: No results found for this basename: CKTOTAL, CKMB, CKMBINDEX, TROPONINI,  in the last 168 hours  BNP (last 3 results) No results found for this basename: PROBNP,  in the last 8760 hours CBG: No  results found for this basename: GLUCAP,  in the last 168 hours  Radiological Exams on Admission: Dg Hip 1 View Left  03/16/2013   CLINICAL DATA:  Larey Seat. Injured left hip.  EXAM: LEFT HIP - 1 VIEW:  COMPARISON:  None.  FINDINGS: There is a displaced femoral neck fracture with lateral displacement. The visualized bony pelvis is intact.  IMPRESSION: Displaced left femoral neck fracture.   Electronically Signed   By: Loralie Champagne M.D.   On: 03/16/2013 19:30   Dg Knee Left Port  03/16/2013   CLINICAL DATA:  Larey Seat. Injured left knee.  EXAM: PORTABLE LEFT KNEE - 1-2 VIEW  COMPARISON:  9  FINDINGS: Moderate tricompartmental degenerative changes most notable in the medial compartment. No acute fracture or osteochondral abnormality. Irregularity along the inferior pole of the patella. This appears fairly well corticated and could be an old avulsion injury or remote osteochondral lesion. No joint effusion.  IMPRESSION: Tricompartmental degenerative changes.  Bony density off the lower pole of the patella is most likely a remote avulsion fracture but recommend correlation with clinical findings.   Electronically Signed   By: Loralie Champagne M.D.   On: 03/16/2013 19:33    EKG: Independently reviewed.  Assessment/Plan Principal Problem:   Closed left hip fracture   1. Closed left hip fracture - Ortho to see patient in AM, on hip fracture protocol, EKG unremarkable, did have a syncope admit and work up a few months ago, was felt to be due to HTN meds causing hypotension, no syncope since being taken off of HTN meds.  EKG unremarkable, overall patient felt to be low risk for surgery.    Code Status: Full Code (must indicate code status--if unknown or must be presumed, indicate so) Family Communication: Spoke with family at bedside (indicate person spoken with, if applicable, with phone number if by telephone) Disposition Plan: Admit to inpatient (indicate anticipated LOS)  Time spent: 70 min  GARDNER,  JARED M. [facility] Pager [phone]  If 7PM-7AM, please contact night-coverage [email or web address] Password [facility] 03/16/2013, 9:37 PM

## 2013-03-17 ENCOUNTER — Encounter (HOSPITAL_COMMUNITY): Payer: Self-pay | Admitting: Anesthesiology

## 2013-03-17 ENCOUNTER — Encounter (HOSPITAL_COMMUNITY)
Admission: EM | Disposition: A | Discharge status: Skilled Nursing Facility | Payer: Self-pay | Source: Home / Self Care | Attending: Family Medicine

## 2013-03-17 ENCOUNTER — Inpatient Hospital Stay (HOSPITAL_COMMUNITY): Payer: Medicare Other

## 2013-03-17 ENCOUNTER — Inpatient Hospital Stay (HOSPITAL_COMMUNITY): Payer: Medicare Other | Admitting: Anesthesiology

## 2013-03-17 DIAGNOSIS — S72009S Fracture of unspecified part of neck of unspecified femur, sequela: Secondary | ICD-10-CM

## 2013-03-17 DIAGNOSIS — E039 Hypothyroidism, unspecified: Secondary | ICD-10-CM

## 2013-03-17 HISTORY — PX: HIP ARTHROPLASTY: SHX981

## 2013-03-17 LAB — SURGICAL PCR SCREEN: Staphylococcus aureus: NEGATIVE

## 2013-03-17 SURGERY — HEMIARTHROPLASTY, HIP, DIRECT ANTERIOR APPROACH, FOR FRACTURE
Anesthesia: General | Site: Hip | Laterality: Left | Wound class: Clean

## 2013-03-17 MED ORDER — ONDANSETRON HCL 4 MG/2ML IJ SOLN
INTRAMUSCULAR | Status: DC | PRN
  Administered 2013-03-17: 4 mg via INTRAVENOUS

## 2013-03-17 MED ORDER — ENOXAPARIN SODIUM 40 MG/0.4ML ~~LOC~~ SOLN: 40.0000 mg | SUBCUTANEOUS | Status: DC

## 2013-03-17 MED ORDER — METHOCARBAMOL 100 MG/ML IJ SOLN: 500.0000 mg | Freq: Four times a day (QID) | INTRAVENOUS | Status: DC | PRN

## 2013-03-17 MED ORDER — METHOCARBAMOL 500 MG PO TABS
500.0000 mg | ORAL_TABLET | Freq: Four times a day (QID) | ORAL | Status: DC | PRN
  Administered 2013-03-20: 500 mg via ORAL
  Filled 2013-03-17: qty 1

## 2013-03-17 MED ORDER — PHENOL 1.4 % MT LIQD: 1.0000 | OROMUCOSAL | Status: DC | PRN

## 2013-03-17 MED ORDER — SODIUM CHLORIDE 0.9 % IV SOLN
INTRAVENOUS | Status: DC
  Administered 2013-03-18: via INTRAVENOUS
  Administered 2013-03-18: 50 mL/h via INTRAVENOUS
  Administered 2013-03-19: 16:00:00 via INTRAVENOUS

## 2013-03-17 MED ORDER — ONDANSETRON HCL 4 MG/2ML IJ SOLN: 4.0000 mg | Freq: Four times a day (QID) | INTRAMUSCULAR | Status: DC | PRN

## 2013-03-17 MED ORDER — LACTATED RINGERS IV SOLN
INTRAVENOUS | Status: DC | PRN
  Administered 2013-03-17 (×2): via INTRAVENOUS

## 2013-03-17 MED ORDER — LIDOCAINE HCL (CARDIAC) 20 MG/ML IV SOLN
INTRAVENOUS | Status: DC | PRN
  Administered 2013-03-17: 50 mg via INTRAVENOUS

## 2013-03-17 MED ORDER — ACETAMINOPHEN 650 MG RE SUPP: 650.0000 mg | Freq: Four times a day (QID) | RECTAL | Status: DC | PRN

## 2013-03-17 MED ORDER — HYDROCODONE-ACETAMINOPHEN 5-325 MG PO TABS: 1.0000 | ORAL_TABLET | Freq: Four times a day (QID) | ORAL | Status: DC | PRN

## 2013-03-17 MED ORDER — EPHEDRINE SULFATE 50 MG/ML IJ SOLN
INTRAMUSCULAR | Status: DC | PRN
  Administered 2013-03-17 (×4): 5 mg via INTRAVENOUS

## 2013-03-17 MED ORDER — SUCCINYLCHOLINE CHLORIDE 20 MG/ML IJ SOLN
INTRAMUSCULAR | Status: DC | PRN
  Administered 2013-03-17: 100 mg via INTRAVENOUS

## 2013-03-17 MED ORDER — METOCLOPRAMIDE HCL 10 MG PO TABS: 5.0000 mg | ORAL_TABLET | Freq: Three times a day (TID) | ORAL | Status: DC | PRN

## 2013-03-17 MED ORDER — GLYCOPYRROLATE 0.2 MG/ML IJ SOLN
INTRAMUSCULAR | Status: DC | PRN
  Administered 2013-03-17: 0.2 mg via INTRAVENOUS

## 2013-03-17 MED ORDER — METOCLOPRAMIDE HCL 5 MG/ML IJ SOLN: 10.0000 mg | Freq: Once | INTRAMUSCULAR | Status: AC | PRN

## 2013-03-17 MED ORDER — ARTIFICIAL TEARS OP OINT
TOPICAL_OINTMENT | OPHTHALMIC | Status: DC | PRN
  Administered 2013-03-17: 1 via OPHTHALMIC

## 2013-03-17 MED ORDER — METOCLOPRAMIDE HCL 5 MG/ML IJ SOLN: 5.0000 mg | Freq: Three times a day (TID) | INTRAMUSCULAR | Status: DC | PRN

## 2013-03-17 MED ORDER — FENTANYL CITRATE 0.05 MG/ML IJ SOLN: 25.0000 ug | INTRAMUSCULAR | Status: DC | PRN

## 2013-03-17 MED ORDER — CEFAZOLIN SODIUM-DEXTROSE 2-3 GM-% IV SOLR
2.0000 g | Freq: Four times a day (QID) | INTRAVENOUS | Status: AC
  Administered 2013-03-18 (×2): 2 g via INTRAVENOUS
  Filled 2013-03-17 (×2): qty 50

## 2013-03-17 MED ORDER — MENTHOL 3 MG MT LOZG
1.0000 | LOZENGE | OROMUCOSAL | Status: DC | PRN
  Filled 2013-03-17: qty 9

## 2013-03-17 MED ORDER — PROPOFOL 10 MG/ML IV BOLUS
INTRAVENOUS | Status: DC | PRN
  Administered 2013-03-17: 150 mg via INTRAVENOUS

## 2013-03-17 MED ORDER — KCL IN DEXTROSE-NACL 20-5-0.45 MEQ/L-%-% IV SOLN
INTRAVENOUS | Status: DC
  Administered 2013-03-17: 13:00:00 via INTRAVENOUS
  Filled 2013-03-17 (×2): qty 1000

## 2013-03-17 MED ORDER — CEFAZOLIN SODIUM-DEXTROSE 2-3 GM-% IV SOLR
INTRAVENOUS | Status: AC
  Filled 2013-03-17: qty 50

## 2013-03-17 MED ORDER — SODIUM CHLORIDE 0.9 % IR SOLN
Status: DC | PRN
  Administered 2013-03-17: 1

## 2013-03-17 MED ORDER — ONDANSETRON HCL 4 MG PO TABS: 4.0000 mg | ORAL_TABLET | Freq: Four times a day (QID) | ORAL | Status: DC | PRN

## 2013-03-17 MED ORDER — ENOXAPARIN SODIUM 40 MG/0.4ML ~~LOC~~ SOLN
40.0000 mg | SUBCUTANEOUS | Status: DC
  Administered 2013-03-18 – 2013-03-20 (×3): 40 mg via SUBCUTANEOUS
  Filled 2013-03-17 (×3): qty 0.4

## 2013-03-17 MED ORDER — ACETAMINOPHEN 325 MG PO TABS: 650.0000 mg | ORAL_TABLET | Freq: Four times a day (QID) | ORAL | Status: DC | PRN

## 2013-03-17 MED ORDER — MORPHINE SULFATE 2 MG/ML IJ SOLN: 0.5000 mg | INTRAMUSCULAR | Status: DC | PRN

## 2013-03-17 MED ORDER — CEFAZOLIN SODIUM-DEXTROSE 2-3 GM-% IV SOLR
2.0000 g | INTRAVENOUS | Status: AC
  Administered 2013-03-17: 2 g via INTRAVENOUS
  Filled 2013-03-17: qty 50

## 2013-03-17 MED ORDER — ACETAMINOPHEN 325 MG PO TABS
650.0000 mg | ORAL_TABLET | Freq: Four times a day (QID) | ORAL | Status: DC | PRN
  Administered 2013-03-18 – 2013-03-20 (×3): 650 mg via ORAL
  Filled 2013-03-17 (×3): qty 2

## 2013-03-17 MED ORDER — FENTANYL CITRATE 0.05 MG/ML IJ SOLN
INTRAMUSCULAR | Status: DC | PRN
  Administered 2013-03-17 (×2): 25 ug via INTRAVENOUS
  Administered 2013-03-17: 75 ug via INTRAVENOUS
  Administered 2013-03-17: 25 ug via INTRAVENOUS
  Administered 2013-03-17: 50 ug via INTRAVENOUS
  Administered 2013-03-17 (×2): 25 ug via INTRAVENOUS

## 2013-03-17 SURGICAL SUPPLY — 51 items
BLADE SAW SAG 73X25 THK (BLADE) ×1
BLADE SAW SGTL 73X25 THK (BLADE) ×1 IMPLANT
BRUSH FEMORAL CANAL (MISCELLANEOUS) IMPLANT
CAPT HIP HD POR BIPOL/UNIPOL ×1 IMPLANT
CLOTH BEACON ORANGE TIMEOUT ST (SAFETY) ×2 IMPLANT
COVER SURGICAL LIGHT HANDLE (MISCELLANEOUS) ×2 IMPLANT
DRAPE INCISE IOBAN 66X45 STRL (DRAPES) ×2 IMPLANT
DRAPE ORTHO SPLIT 77X108 STRL (DRAPES) ×4
DRAPE SURG ORHT 6 SPLT 77X108 (DRAPES) ×2 IMPLANT
DRAPE U-SHAPE 47X51 STRL (DRAPES) ×2 IMPLANT
DRSG MEPILEX BORDER 4X8 (GAUZE/BANDAGES/DRESSINGS) ×2 IMPLANT
DURAPREP 26ML APPLICATOR (WOUND CARE) ×3 IMPLANT
ELECT BLADE 4.0 EZ CLEAN MEGAD (MISCELLANEOUS) ×2
ELECT REM PT RETURN 9FT ADLT (ELECTROSURGICAL) ×2
ELECTRODE BLDE 4.0 EZ CLN MEGD (MISCELLANEOUS) IMPLANT
ELECTRODE REM PT RTRN 9FT ADLT (ELECTROSURGICAL) ×1 IMPLANT
GLOVE BIOGEL PI IND STRL 7.5 (GLOVE) IMPLANT
GLOVE BIOGEL PI INDICATOR 7.5 (GLOVE) ×1
GLOVE BIOGEL PI ORTHO PRO 7.5 (GLOVE) ×1
GLOVE BIOGEL PI ORTHO PRO SZ8 (GLOVE) ×1
GLOVE ECLIPSE 7.0 STRL STRAW (GLOVE) ×2 IMPLANT
GLOVE ORTHO TXT STRL SZ7.5 (GLOVE) ×2 IMPLANT
GLOVE PI ORTHO PRO STRL 7.5 (GLOVE) ×1 IMPLANT
GLOVE PI ORTHO PRO STRL SZ8 (GLOVE) ×1 IMPLANT
GLOVE SURG ORTHO 8.5 STRL (GLOVE) ×2 IMPLANT
GOWN STRL NON-REIN LRG LVL3 (GOWN DISPOSABLE) ×2 IMPLANT
GOWN STRL REIN XL XLG (GOWN DISPOSABLE) ×4 IMPLANT
HANDPIECE INTERPULSE COAX TIP (DISPOSABLE)
IMMOBILIZER KNEE 22 UNIV (SOFTGOODS) ×1 IMPLANT
KIT BASIN OR (CUSTOM PROCEDURE TRAY) ×2 IMPLANT
KIT ROOM TURNOVER OR (KITS) ×2 IMPLANT
MANIFOLD NEPTUNE II (INSTRUMENTS) ×2 IMPLANT
NS IRRIG 1000ML POUR BTL (IV SOLUTION) ×2 IMPLANT
PACK TOTAL JOINT (CUSTOM PROCEDURE TRAY) ×2 IMPLANT
PAD ARMBOARD 7.5X6 YLW CONV (MISCELLANEOUS) ×4 IMPLANT
SET HNDPC FAN SPRY TIP SCT (DISPOSABLE) IMPLANT
STAPLER VISISTAT 35W (STAPLE) ×2 IMPLANT
STRIP CLOSURE SKIN 1/2X4 (GAUZE/BANDAGES/DRESSINGS) IMPLANT
SUT ETHIBOND NAB CT1 #1 30IN (SUTURE) ×4 IMPLANT
SUT MNCRL AB 3-0 PS2 18 (SUTURE) IMPLANT
SUT VIC AB 0 CT1 27 (SUTURE) ×2
SUT VIC AB 0 CT1 27XBRD ANBCTR (SUTURE) ×1 IMPLANT
SUT VIC AB 1 CT1 27 (SUTURE) ×2
SUT VIC AB 1 CT1 27XBRD ANBCTR (SUTURE) IMPLANT
SUT VIC AB 2-0 CT1 27 (SUTURE) ×2
SUT VIC AB 2-0 CT1 TAPERPNT 27 (SUTURE) ×1 IMPLANT
TOWEL OR 17X24 6PK STRL BLUE (TOWEL DISPOSABLE) ×2 IMPLANT
TOWEL OR 17X26 10 PK STRL BLUE (TOWEL DISPOSABLE) ×2 IMPLANT
TOWER CARTRIDGE SMART MIX (DISPOSABLE) IMPLANT
TRAY FOLEY CATH 16FRSI W/METER (SET/KITS/TRAYS/PACK) ×1 IMPLANT
WATER STERILE IRR 1000ML POUR (IV SOLUTION) ×6 IMPLANT

## 2013-03-17 NOTE — Anesthesia Procedure Notes (Signed)
Procedure Name: Intubation Date/Time: 03/17/2013 9:28 PM Performed by: Luster Landsberg Pre-anesthesia Checklist: Patient identified, Emergency Drugs available, Suction available and Patient being monitored Patient Re-evaluated:Patient Re-evaluated prior to inductionOxygen Delivery Method: Circle system utilized Preoxygenation: Pre-oxygenation with 100% oxygen Intubation Type: IV induction and Rapid sequence Laryngoscope Size: Mac and 3 Grade View: Grade I Tube type: Oral Tube size: 7.0 mm Number of attempts: 1 Airway Equipment and Method: Stylet Placement Confirmation: ETT inserted through vocal cords under direct vision,  positive ETCO2 and breath sounds checked- equal and bilateral Secured at: 20 cm Tube secured with: Tape

## 2013-03-17 NOTE — Brief Op Note (Signed)
03/16/2013 - 03/17/2013  10:48 PM  PATIENT:  Curt Bears [place]  79 y.o. female  PRE-OPERATIVE DIAGNOSIS:  left hip fracture, displaced femoral neck fracture  POST-OPERATIVE DIAGNOSIS:  left hip fracture,displaced femoral neck fracture  PROCEDURE:  Procedure(s): ARTHROPLASTY BIPOLAR HIP (Left) DePuy Tri Loc  SURGEON:  Surgeon(s) and Role:    * Verlee Rossetti, MD - Primary  PHYSICIAN ASSISTANT:   ASSISTANTS: Thea Gist, PA-C   ANESTHESIA:   general  EBL:  Total I/O In: 1000 [I.V.:1000] Out: 400 [Urine:200; Blood:200]  BLOOD ADMINISTERED:none  DRAINS: none   LOCAL MEDICATIONS USED:  NONE  SPECIMEN:  No Specimen  DISPOSITION OF SPECIMEN:  N/A  COUNTS:  YES  TOURNIQUET:  * No tourniquets in log *  DICTATION: .Other Dictation: Dictation Number [id number]  PLAN OF CARE: Admit to inpatient   PATIENT DISPOSITION:  PACU - hemodynamically stable.   Delay start of Pharmacological VTE agent (>24hrs) due to surgical blood loss or risk of bleeding: no

## 2013-03-17 NOTE — Anesthesia Preprocedure Evaluation (Addendum)
Anesthesia Evaluation  Patient identified by MRN, date of birth, ID band Patient awake    Reviewed: Allergy & Precautions, H&P , NPO status , Patient's Chart, lab work & pertinent test results, reviewed documented beta blocker date and time   Airway Mallampati: II TM Distance: >3 FB Neck ROM: full    Dental  (+) Teeth Intact and Dental Advisory Given   Pulmonary neg pulmonary ROS,  breath sounds clear to auscultation        Cardiovascular hypertension, Pt. on medications Rhythm:regular     Neuro/Psych  Neuromuscular disease negative psych ROS   GI/Hepatic negative GI ROS, Neg liver ROS,   Endo/Other  Hypothyroidism   Renal/GU negative Renal ROS  negative genitourinary   Musculoskeletal   Abdominal   Peds  Hematology negative hematology ROS (+)   Anesthesia Other Findings See surgeon's H&P   Reproductive/Obstetrics negative OB ROS                          Anesthesia Physical Anesthesia Plan  ASA: II and emergent  Anesthesia Plan: General   Post-op Pain Management:    Induction: Intravenous  Airway Management Planned: Oral ETT  Additional Equipment:   Intra-op Plan:   Post-operative Plan: Extubation in OR  Informed Consent: I have reviewed the patients History and Physical, chart, labs and discussed the procedure including the risks, benefits and alternatives for the proposed anesthesia with the patient or authorized representative who has indicated his/her understanding and acceptance.   Dental Advisory Given  Plan Discussed with: CRNA and Surgeon  Anesthesia Plan Comments: (Family present for discussion of SAB vs. GA. Patient is not really oriented to the whole situation. Patient seems more interested in [place] and family agrees that she may be somewhat uncooperative. Dr. Ranell Patrick present for end of the conversation. CF)      Anesthesia Quick Evaluation

## 2013-03-17 NOTE — H&P (View-Only) (Signed)
Kaleen Mask, MD Chief Complaint: CTSP for hip fx - patient requested [facility] History: Morgan Weaver is a 79 y.o. female who suffered a mechanical fall (no LOC), today while working in her garden. Unfortunately the patient was unable to stand up after the fall, and trying to do so caused excruciating left hip pain. Pain is worse with movement, better at rest and severe in intensity, located in L hip. She was therefore brought to the ED where she was found to have a displaced L hip fracture. Hospitalist has been asked to admit  Past Medical History  Diagnosis Date  . Torn rotator cuff   . Ovarian cyst   . Lyme disease   . Hypertension   . Hemorrhoid   . Arthritis   . Weight decrease   . Hyperlipidemia   . Thyroid disease     hypothyroidism    Allergies  Allergen Reactions  . Mobic [Meloxicam]     Abdominal pain, ?GI ulcer    . Sulfa Drugs Cross Reactors     unknown  . Tetracyclines & Related Swelling    Lips swell When asked 6/4 patient states unknown reaction    No current facility-administered medications on file prior to encounter.   Current Outpatient Prescriptions on File Prior to Encounter  Medication Sig Dispense Refill  . cyanocobalamin (,VITAMIN B-12,) 1000 MCG/ML injection Inject 1 mL (1,000 mcg total) into the skin once. injection once daily x 5 days, then once weekly for 4 weeks, then once a month  1 mL  10  . donepezil (ARICEPT) 10 MG tablet Take 1 tablet (10 mg total) by mouth at bedtime.  30 tablet  3  . levothyroxine (SYNTHROID, LEVOTHROID) 125 MCG tablet Take 125 mcg by mouth daily before breakfast.       . vitamin B-12 (CYANOCOBALAMIN) 1000 MCG tablet Take 1,000 mcg by mouth daily.        Physical Exam: Filed Vitals:   03/16/13 2230  BP: 131/77  Pulse: 71  Temp: 99.1 F (37.3 C)  Resp:   No SOB/CP Abd soft/NT NVI  Compartments soft/NT No knee pain with palpation/rom Left hip pain with ROM No laceration/abrasion  noted   Image: Dg Hip 1 View Left  03/16/2013   CLINICAL DATA:  Larey Seat. Injured left hip.  EXAM: LEFT HIP - 1 VIEW:  COMPARISON:  None.  FINDINGS: There is a displaced femoral neck fracture with lateral displacement. The visualized bony pelvis is intact.  IMPRESSION: Displaced left femoral neck fracture.   Electronically Signed   By: Loralie Champagne M.D.   On: 03/16/2013 19:30   Mr Brain Wo Contrast  03/02/2013   [facility]  NEUROIMAGING REPORT   STUDY DATE: 03/02/13 PATIENT NAME: Morgan Weaver DOB: 08/01/1928 MRN: [id number]  ORDERING CLINICIAN: Elspeth Cho, DO  CLINICAL HISTORY: 79 year old female with memory loss.  EXAM: MRI brain (without)  TECHNIQUE: MRI of the brain without contrast was obtained utilizing 5 mm  axial slices with T1, T2, T2 flair, SWI and diffusion weighted views.  T1  sagittal and T2 coronal views were obtained. CONTRAST: no IMAGING SITE: [employer] (3 Tesla MRI)   FINDINGS:  No abnormal lesions are seen on diffusion-weighted views to suggest acute  ischemia. The cortical sulci, fissures and cisterns are notable for  moderate diffuse atrophy. Lateral, third and fourth ventricle are  moderately enlarged. No extra-axial fluid collections are seen. No  evidence of mass effect or midline shift.  Moderate periventricular,  subcortical, and pontine chronic small vessel ischemic disease.  On sagittal views the posterior fossa, pituitary gland and corpus callosum  are unremarkable. No evidence of intracranial hemorrhage on SWI views. The  orbits and their contents, paranasal sinuses and calvarium are notable for  post-surgical orbits.  Intracranial flow voids are present.   03/02/2013   Abnormal MRI brain (without) demonstrating: 1. Moderate cortical and subcortical atrophy. 2. Moderate chronic small vessel ischemic disease. 3. Compared to prior MRI on 05/08/09, there has been some progression of  atrophy and chronic small vessel ischemic disease.    INTERPRETING PHYSICIAN:  Suanne Marker, MD Certified in Neurology, Neurophysiology and Neuroimaging  [facility] [address] [place], [place] [address] [phone]   Dg Knee Left Port  03/16/2013   CLINICAL DATA:  Larey Seat. Injured left knee.  EXAM: PORTABLE LEFT KNEE - 1-2 VIEW  COMPARISON:  9  FINDINGS: Moderate tricompartmental degenerative changes most notable in the medial compartment. No acute fracture or osteochondral abnormality. Irregularity along the inferior pole of the patella. This appears fairly well corticated and could be an old avulsion injury or remote osteochondral lesion. No joint effusion.  IMPRESSION: Tricompartmental degenerative changes.  Bony density off the lower pole of the patella is most likely a remote avulsion fracture but recommend correlation with clinical findings.   Electronically Signed   By: Loralie Champagne M.D.   On: 03/16/2013 19:33    A/P:  PAtient s/p mechanical fall with left femoral neck fracture.  No other injury noted Closed injury.   Medically cleared for surgery Will require hemiarthroplasty Will discuss with my partner as I do not do this type of surgery Continue NPO for now - if unable to go to OR Friday will notify nsg.

## 2013-03-17 NOTE — Transfer of Care (Signed)
Immediate Anesthesia Transfer of Care Note  Patient: Morgan Weaver  Procedure(s) Performed: Procedure(s): ARTHROPLASTY BIPOLAR HIP (Left)  Patient Location: PACU  Anesthesia Type:General  Level of Consciousness: awake  Airway & Oxygen Therapy: Patient Spontanous Breathing and Patient connected to nasal cannula oxygen  Post-op Assessment: Report given to PACU RN, Post -op Vital signs reviewed and stable and Patient moving all extremities  Post vital signs: Reviewed and stable  Complications: No apparent anesthesia complications

## 2013-03-17 NOTE — Progress Notes (Signed)
UR COMPLETED  

## 2013-03-17 NOTE — ED Provider Notes (Signed)
Medical screening examination/treatment/procedure(s) were conducted as a shared visit with non-physician practitioner(s) and myself.  I personally evaluated the patient during the encounter.   Please see my separate note.     Candyce Churn, MD 03/17/13 1054

## 2013-03-17 NOTE — Progress Notes (Signed)
[facility] PROGRESS NOTE  Morgan Weaver ZOX:[id number] DOB: 1928-08-02 DOA: 03/16/2013 PCP: Kaleen Mask, MD  Assessment/Plan: 1. Closed left hip fracture - hip x ray reports left femoral neck fracture. - Ortho consulted. Awaiting recommendations. Patient npo was on normal saline but will change to MIVF please refer to order for details. - Pain control  2. Hypothyroidism - stable will plan on continuing home regimen.  Code Status: full Family Communication: discussed with patient and family members at bedside.  Disposition Plan: Awaiting ortho evaluation and recommendations.   Consultants:  Ortho: Dr. Shon Baton  Procedures:  None at this point  Antibiotics:  None  HPI/Subjective: No new complaints. No acute issues overnight. Family looking forward to talking to surgeon regarding further evaluation and recommendations from their standpoint.   Objective: Filed Vitals:   03/17/13 0833  BP:   Pulse:   Temp:   Resp: 16    Intake/Output Summary (Last 24 hours) at 03/17/13 1304 Last data filed at 03/17/13 0900  Gross per 24 hour  Intake      0 ml  Output      0 ml  Net      0 ml   There were no vitals filed for this visit.  Exam:   General:  Pt in NAD, Alert and awake  Cardiovascular: RRR, no MRG  Respiratory: CTA BL, no wheezes  Abdomen: soft, NT, ND  Musculoskeletal: warm and dry   Data Reviewed: Basic Metabolic Panel:  Recent Labs Lab 03/16/13 1934  NA 139  K 3.4*  CL 105  CO2 20  GLUCOSE 91  BUN 22  CREATININE 1.03  CALCIUM 8.9   Liver Function Tests: No results found for this basename: AST, ALT, ALKPHOS, BILITOT, PROT, ALBUMIN,  in the last 168 hours No results found for this basename: LIPASE, AMYLASE,  in the last 168 hours No results found for this basename: AMMONIA,  in the last 168 hours CBC:  Recent Labs Lab 03/16/13 1934  WBC 7.5  NEUTROABS 4.4  HGB 12.1  HCT 34.3*  MCV 88.6  PLT 157   Cardiac Enzymes: No  results found for this basename: CKTOTAL, CKMB, CKMBINDEX, TROPONINI,  in the last 168 hours BNP (last 3 results) No results found for this basename: PROBNP,  in the last 8760 hours CBG: No results found for this basename: GLUCAP,  in the last 168 hours  Recent Results (from the past 240 hour(s))  SURGICAL PCR SCREEN     Status: None   Collection Time    03/17/13  2:42 AM      Result Value Range Status   MRSA, PCR NEGATIVE  NEGATIVE Final   Staphylococcus aureus NEGATIVE  NEGATIVE Final   Comment:            The Xpert SA Assay (FDA     approved for NASAL specimens     in patients over 51 years of age),     is one component of     a comprehensive surveillance     program.  Test performance has     been validated by [employer] for patients greater     than or equal to 60 year old.     It is not intended     to diagnose infection nor to     guide or monitor treatment.     Studies: Dg Hip 1 View Left  03/16/2013   CLINICAL DATA:  Larey Seat. Injured left hip.  EXAM: LEFT HIP - 1 VIEW:  COMPARISON:  None.  FINDINGS: There is a displaced femoral neck fracture with lateral displacement. The visualized bony pelvis is intact.  IMPRESSION: Displaced left femoral neck fracture.   Electronically Signed   By: Loralie Champagne M.D.   On: 03/16/2013 19:30   Dg Knee Left Port  03/16/2013   CLINICAL DATA:  Larey Seat. Injured left knee.  EXAM: PORTABLE LEFT KNEE - 1-2 VIEW  COMPARISON:  9  FINDINGS: Moderate tricompartmental degenerative changes most notable in the medial compartment. No acute fracture or osteochondral abnormality. Irregularity along the inferior pole of the patella. This appears fairly well corticated and could be an old avulsion injury or remote osteochondral lesion. No joint effusion.  IMPRESSION: Tricompartmental degenerative changes.  Bony density off the lower pole of the patella is most likely a remote avulsion fracture but recommend correlation with clinical findings.    Electronically Signed   By: Loralie Champagne M.D.   On: 03/16/2013 19:33    Scheduled Meds: . donepezil  10 mg Oral QHS  . levothyroxine  125 mcg Oral QAC breakfast  . vitamin B-12  1,000 mcg Oral Daily   Continuous Infusions: . dextrose 5 % and 0.45 % NaCl with KCl 20 mEq/L      Principal Problem:   Closed left hip fracture    Time spent: > 35 minutes    Penny Pia  [facility] Pager [phone]. If 7PM-7AM, please contact night-coverage at [email or web address], password [facility] 03/17/2013, 1:04 PM  LOS: 1 day

## 2013-03-17 NOTE — Interval H&P Note (Signed)
History and Physical Interval Note:  03/17/2013 9:12 PM  Morgan Weaver  has presented today for surgery, with the diagnosis of /  The various methods of treatment have been discussed with the patient and family. After consideration of risks, benefits and other options for treatment, the patient has consented to  Procedure(s): ARTHROPLASTY BIPOLAR HIP (Left) as a surgical intervention .  The patient's history has been reviewed, patient examined, no change in status, stable for surgery.  I have reviewed the patient's chart and labs.  Questions were answered to the patient's satisfaction.     Shermaine Brigham,STEVEN R

## 2013-03-18 DIAGNOSIS — E871 Hypo-osmolality and hyponatremia: Secondary | ICD-10-CM

## 2013-03-18 DIAGNOSIS — I10 Essential (primary) hypertension: Secondary | ICD-10-CM

## 2013-03-18 DIAGNOSIS — R21 Rash and other nonspecific skin eruption: Secondary | ICD-10-CM

## 2013-03-18 LAB — CBC
HCT: 32.8 % — ABNORMAL LOW (ref 36.0–46.0)
Hemoglobin: 12.6 g/dL (ref 12.0–15.0)
Hemoglobin: 11.6 g/dL — ABNORMAL LOW (ref 12.0–15.0)
MCH: 31.3 pg (ref 26.0–34.0)
MCH: 31.2 pg (ref 26.0–34.0)
MCHC: 35.4 g/dL (ref 30.0–36.0)
MCHC: 35.4 g/dL (ref 30.0–36.0)
Platelets: 130 10*3/uL — ABNORMAL LOW (ref 150–400)
Platelets: 133 10*3/uL — ABNORMAL LOW (ref 150–400)
RDW: 12.4 % (ref 11.5–15.5)
RDW: 12.4 % (ref 11.5–15.5)
WBC: 10.3 10*3/uL (ref 4.0–10.5)

## 2013-03-18 LAB — BASIC METABOLIC PANEL
BUN: 11 mg/dL (ref 6–23)
Calcium: 8.1 mg/dL — ABNORMAL LOW (ref 8.4–10.5)
Creatinine, Ser: 0.76 mg/dL (ref 0.50–1.10)
GFR calc Af Amer: 89 mL/min — ABNORMAL LOW (ref >90)
GFR calc non Af Amer: 76 mL/min — ABNORMAL LOW (ref >90)
Glucose, Bld: 126 mg/dL — ABNORMAL HIGH (ref 70–99)
Potassium: 3.9 mEq/L (ref 3.5–5.1)

## 2013-03-18 LAB — CREATININE, SERUM
Creatinine, Ser: 0.71 mg/dL (ref 0.50–1.10)
GFR calc non Af Amer: 78 mL/min — ABNORMAL LOW (ref >90)

## 2013-03-18 MED ORDER — HYDROXYZINE HCL 10 MG PO TABS
10.0000 mg | ORAL_TABLET | Freq: Four times a day (QID) | ORAL | Status: DC | PRN
  Filled 2013-03-18: qty 1

## 2013-03-18 NOTE — Evaluation (Signed)
Physical Therapy Evaluation Patient Details Name: Morgan Weaver MRN: [id number] DOB: Nov 15, 1928 Today's Date: 03/18/2013 Time: 4098-1191 PT Time Calculation (min): 25 min  PT Assessment / Plan / Recommendation History of Present Illness  Pt. was admitted following a fall resulting in L femoral neck fx.  she underwent bipolar hip arthroplasty.    Clinical Impression  Pt. Presents to PT with decrease in functional mobility and gait and the below deficits.  She needs acute Pt to begin to address these issues in preparation for next venue of care and for ultimate return home.  She currently has decreased cognition and cannot state what happened to her, where she is or the orientation info.  She was noted to have rash/whelps over most of body and RN Donnamae Jude was made aware.  It appears like an allergic reaction, ? To an antibiotic. Pt. Is itching.  RN phoned MD and received orders.    PT Assessment  Patient needs continued PT services    Follow Up Recommendations  SNF;Supervision/Assistance - 24 hour;Supervision for mobility/OOB    Does the patient have the potential to tolerate intense rehabilitation      Barriers to Discharge        Equipment Recommendations  None recommended by PT    Recommendations for Other Services     Frequency Min 6X/week    Precautions / Restrictions Precautions Precautions: Fall;Posterior Hip;Other (comment) (presumed posterior, pt. has KI in place currently) Required Braces or Orthoses: Knee Immobilizer - Left Restrictions Weight Bearing Restrictions: Yes LLE Weight Bearing: Weight bearing as tolerated Other Position/Activity Restrictions: Pt. with presumed posterior hip precautions due to inplace knee immobilizer on L LE. No orders in chart, will leave note for MD to clarify.  In the meantime, will observe posterior precautions for safety   Pertinent Vitals/Pain See vitals tab       Mobility  Bed Mobility Bed Mobility: Supine to Sit;Sitting - Scoot  to Edge of Bed Supine to Sit: 1: +2 Total assist Supine to Sit: Patient Percentage: 10% Sitting - Scoot to Edge of Bed: 1: +2 Total assist Sitting - Scoot to Edge of Bed: Patient Percentage: 10% Details for Bed Mobility Assistance: pt. needed total assist of 2 and heavy use of bed pad to achieve EOB. Transfers Transfers: Sit to Stand;Stand to Sit;Stand Pivot Transfers Sit to Stand: 1: +2 Total assist;From bed;With upper extremity assist Sit to Stand: Patient Percentage: 30% Stand to Sit: 1: +2 Total assist;To chair/3-in-1 Stand to Sit: Patient Percentage: 30% Stand Pivot Transfers: 1: +2 Total assist Stand Pivot Transfers: Patient Percentage: 30% Details for Transfer Assistance: pt. needed 2 assist for sit <> stand and transfers due to weakness and decreased activity tolerance.  Needs vc's for technique Ambulation/Gait Ambulation/Gait Assistance: Not tested (comment)    Exercises General Exercises - Lower Extremity Ankle Circles/Pumps: AROM;Both;10 reps;Supine Quad Sets: AROM;Both;5 reps;Supine   PT Diagnosis: Difficulty walking;Abnormality of gait;Acute pain  PT Problem List: Decreased strength;Decreased activity tolerance;Decreased mobility;Decreased knowledge of use of DME;Decreased knowledge of precautions;Pain PT Treatment Interventions: DME instruction;Gait training;Functional mobility training;Therapeutic activities;Therapeutic exercise;Patient/family education     PT Goals(Current goals can be found in the care plan section) Acute Rehab PT Goals Patient Stated Goal: pt. did not give her goal PT Goal Formulation: With patient Time For Goal Achievement: 03/25/13 Potential to Achieve Goals: Fair  Visit Information  Last PT Received On: 03/18/13 Assistance Needed: +2 PT/OT Co-Evaluation/Treatment: Yes History of Present Illness: Pt. was admitted following a fall resulting in L femoral  neck fx.  she underwent bipolar hip arthroplasty.         Prior Functioning  Home  Living Family/patient expects to be discharged to:: Private residence Living Arrangements: Children Available Help at Discharge: Family Type of Home: House Home Access: Stairs to enter Secretary/administrator of Steps: 3 Entrance Stairs-Rails: None Home Layout: One level Home Equipment: Environmental consultant - 2 wheels;Cane - single point Prior Function Level of Independence: Independent Communication Communication: No difficulties    Cognition  Cognition Arousal/Alertness: Lethargic (? if due to medications) Behavior During Therapy: WFL for tasks assessed/performed Overall Cognitive Status: No family/caregiver present to determine baseline cognitive functioning Memory: Decreased short-term memory;Decreased recall of precautions    Extremity/Trunk Assessment Upper Extremity Assessment Upper Extremity Assessment: Defer to OT evaluation Lower Extremity Assessment Lower Extremity Assessment: LLE deficits/detail;Difficult to assess due to impaired cognition LLE Deficits / Details: good ankle pump; faiar quad set   Balance    End of Session PT - End of Session Equipment Utilized During Treatment: Gait belt;Left knee immobilizer Activity Tolerance: Patient tolerated treatment well Patient left: in chair;with call bell/phone within reach Nurse Communication: Mobility status;Other (comment);Precautions;Weight bearing status (need for 2 assist for transfers)  GP     Ferman Hamming 03/18/2013, 9:54 AM Weldon Picking PT Acute Rehab Services [phone] [place] [phone]

## 2013-03-18 NOTE — Op Note (Signed)
Morgan Weaver, BHAGAT NO.:  [id number]  MEDICAL RECORD NO.:  [id number]  LOCATION:  5N20C                        FACILITY:  [facility]  PHYSICIAN:  Almedia Balls. Ranell Patrick, M.D. DATE OF BIRTH:  Apr 11, 1929  DATE OF PROCEDURE:  03/17/2013 DATE OF DISCHARGE:                              OPERATIVE REPORT   PREOPERATIVE DIAGNOSIS:  Displaced left femoral neck fracture.  POSTOPERATIVE DIAGNOSIS:  Displaced left femoral neck fracture.  PROCEDURE PERFORMED:  Left hip hemiarthroplasty using DePuy Tri-Lock prosthesis.  ATTENDING SURGEON:  Almedia Balls. Ranell Patrick, MD  ASSISTANT:  Donnie Coffin. Dixon, PA-C, who was scrubbed the entire procedure and necessary for satisfactory completion of surgery.  ANESTHESIA:  General anesthesia was used.  ESTIMATED BLOOD LOSS:  150 mL.  FLUID REPLACED:  1200 mL crystalloid.  INSTRUMENT COUNTS:  Correct.  COMPLICATIONS:  There were no complications.  ANTIBIOTICS:  Perioperative antibiotics were given.  INDICATIONS:  The patient is an 79 year old female who suffered a ground level fall injuring her left hip.  The patient presented to [facility] Room with left hip pain unable to ambulate following her fall. The patient complains of severe pain with any movement of the limb.  The patient has a shortened externally rotated limb and x-rays demonstrating a femoral neck fracture was displaced.  Having counseled the patient regarding the necessity of stabilizing and recreating her upper femoral anatomy with a hemiarthroplasty the patient agreed and family consented to surgery.  Informed consent obtained.  DESCRIPTION OF PROCEDURE:  After an adequate level of anesthesia was achieved, the patient was positioned in the right lateral decubitus position with the left hip up.  The leg padded appropriately and all neurovascular structures padded.  We also used an axillary roll.  After sterile prep and drape of the left leg, time-out was called.  We  entered the hip using a posterior Kocher Langenbach approach starting at the vastus ridge and extending down along the line of the gluteus maximus. Dissection down to subcutaneous tissues.  Tensor fascia lata divided. Gluteus medius retracted.  Short external rotators divided including piriformis.  We went down through the posterior capsule.  Hematoma was identified indicating a fresh fracture.  No abnormal pathologic features noted.  The femoral neck was fractured.  We went ahead and performed our neck cut with an oscillating saw one fingerbreadth above the lesser trochanter.  We then removed the femoral head and sized to 46 diameter and removed the ligament teres.  Inspected the acetabulum, had good cartilage and no loose bony debris.  We then broached up to a size 8 Tri- Lock stem, and then trialed with the 8 standard offset and +0 neck and 46 head, reduced the hip.  We were happy with the soft tissue balancing and stability.  We then removed the trial components and inserted the 8 standard offset Tri-Lock stem in about 30 degrees of anteversion.  With that securely in place, we felt like this seated just a touch down a little further trials.  We selected the +5 neck adapter and then a 46 monopolar head ball and impacted this into position.  We reduced the hip.  We had symmetric leg lengths and  nice stable leg on leg in 90:90. We thoroughly irrigated and closed the capsule with interrupted #1 Ethibond suture, followed by a #1 Vicryl suture for the tensor fascia lata, and 0 Vicryl and 2-0 Vicryl subcutaneous closure and 4-0 Monocryl for skin.  Steri-Strips applied followed by sterile dressing.  The patient tolerated the surgery well.     Almedia Balls. Ranell Patrick, M.D.     SRN/MEDQ  D:  03/17/2013  T:  03/18/2013  Job:  [id number]

## 2013-03-18 NOTE — Evaluation (Signed)
Occupational Therapy Evaluation Patient Details Name: Morgan Weaver MRN: [id number] DOB: 08-20-1928 Today's Date: 03/18/2013 Time: 2725-3664 OT Time Calculation (min): 26 min  OT Assessment / Plan / Recommendation History of present illness Pt. was admitted following a fall resulting in L femoral neck fx.  she underwent bipolar hip arthroplasty.     Clinical Impression   Pt admitted with above. Will continue to follow acutely in order to address below problem list. Recommending SNF for d/c planning.   OT Assessment  Patient needs continued OT Services    Follow Up Recommendations  SNF;Supervision/Assistance - 24 hour    Barriers to Discharge      Equipment Recommendations  3 in 1 bedside comode    Recommendations for Other Services    Frequency  Min 2X/week    Precautions / Restrictions Precautions Precautions: Fall;Posterior Hip;Other (comment) (presumed posterior, pt. has KI in place currently) Required Braces or Orthoses: Knee Immobilizer - Left Restrictions Weight Bearing Restrictions: Yes LLE Weight Bearing: Weight bearing as tolerated Other Position/Activity Restrictions: Pt. with presumed posterior hip precautions due to inplace knee immobilizer on L LE. No orders in chart, will leave note for MD to clarify.  In the meantime, will observe posterior precautions for safety   Pertinent Vitals/Pain See vitals    ADL  Eating/Feeding: Performed;Set up Where Assessed - Eating/Feeding: Chair Grooming: Performed;Wash/dry face;Supervision/safety Where Assessed - Grooming: Supported sitting Upper Body Bathing: Performed;Right arm;Left arm;Supervision/safety Where Assessed - Upper Body Bathing: Supported sitting Lower Body Bathing: Simulated;+2 Total assistance Lower Body Bathing: Patient Percentage: 30% Where Assessed - Lower Body Bathing: Supported sit to stand Upper Body Dressing: Simulated;Supervision/safety Where Assessed - Upper Body Dressing: Supported sitting Lower  Body Dressing: Simulated;+2 Total assistance Lower Body Dressing: Patient Percentage: 30% Where Assessed - Lower Body Dressing: Supported sit to Pharmacist, hospital: Simulated;+2 Total assistance Toilet Transfer: Patient Percentage: 30% Statistician Method: Surveyor, minerals:  (bed<>chair) Equipment Used: Gait belt;Rolling walker Transfers/Ambulation Related to ADLs: +2 total assist for balance/steadying and advancing LEs to take pivotal steps to chair. ADL Comments: Pt very lethargic during session. Pt disoriented to time, place and situation. Noted pt covered with rash over most of her body and scratching herself all over. RN made aware and present at bedside to see.    OT Diagnosis: Generalized weakness;Acute pain;Cognitive deficits  OT Problem List: Decreased strength;Decreased activity tolerance;Impaired balance (sitting and/or standing);Decreased cognition;Decreased knowledge of use of DME or AE;Decreased knowledge of precautions;Pain OT Treatment Interventions: Self-care/ADL training;DME and/or AE instruction;Therapeutic activities;Patient/family education;Balance training;Cognitive remediation/compensation   OT Goals(Current goals can be found in the care plan section) Acute Rehab OT Goals Patient Stated Goal: pt. did not give her goal OT Goal Formulation: With patient Time For Goal Achievement: 04/01/13 Potential to Achieve Goals: Good  Visit Information  Last OT Received On: 03/18/13 Assistance Needed: +2 History of Present Illness: Pt. was admitted following a fall resulting in L femoral neck fx.  she underwent bipolar hip arthroplasty.         Prior Functioning     Home Living Family/patient expects to be discharged to:: Private residence Living Arrangements: Children Available Help at Discharge: Family Type of Home: House Home Access: Stairs to enter Secretary/administrator of Steps: 3 Entrance Stairs-Rails: None Home Layout: One  level Home Equipment: Environmental consultant - 2 wheels;Cane - single point Prior Function Level of Independence: Independent Communication Communication: No difficulties         Vision/Perception     Cognition  Cognition Arousal/Alertness: Lethargic Behavior During Therapy: WFL for tasks assessed/performed Overall Cognitive Status: No family/caregiver present to determine baseline cognitive functioning Memory: Decreased short-term memory;Decreased recall of precautions    Extremity/Trunk Assessment Upper Extremity Assessment Upper Extremity Assessment: Overall WFL for tasks assessed Lower Extremity Assessment Lower Extremity Assessment: LLE deficits/detail;Difficult to assess due to impaired cognition LLE Deficits / Details: good ankle pump; faiar quad set     Mobility Bed Mobility Bed Mobility: Supine to Sit;Sitting - Scoot to Edge of Bed Supine to Sit: 1: +2 Total assist Supine to Sit: Patient Percentage: 10% Sitting - Scoot to Edge of Bed: 1: +2 Total assist Sitting - Scoot to Edge of Bed: Patient Percentage: 10% Details for Bed Mobility Assistance: pt. needed total assist of 2 and heavy use of bed pad to achieve EOB. Transfers Transfers: Sit to Stand;Stand to Sit Sit to Stand: 1: +2 Total assist;From bed;With upper extremity assist Sit to Stand: Patient Percentage: 30% Stand to Sit: 1: +2 Total assist;To chair/3-in-1 Stand to Sit: Patient Percentage: 30% Details for Transfer Assistance: pt. needed 2 assist for sit <> stand and transfers due to weakness and decreased activity tolerance.  Needs vc's for technique     Exercise General Exercises - Lower Extremity Ankle Circles/Pumps: AROM;Both;10 reps;Supine Quad Sets: AROM;Both;5 reps;Supine   Balance     End of Session OT - End of Session Equipment Utilized During Treatment: Left knee immobilizer;Gait belt;Rolling walker Activity Tolerance: Patient limited by lethargy Patient left: in chair;with call bell/phone within  reach Nurse Communication: Mobility status;Other (comment) (pt with rash all over body)  GO    03/18/2013 Cipriano Mile OTR/L Pager [phone] Office [phone]  Cipriano Mile 03/18/2013, 11:30 AM

## 2013-03-18 NOTE — Progress Notes (Signed)
[facility] PROGRESS NOTE  Morgan Weaver:[id number] DOB: [birth year] DOA: 03/16/2013 PCP: Kaleen Mask, MD  Assessment/Plan: 1. Closed left hip fracture - hip x ray reports left femoral neck fracture. - Pt underwent Arthroplasy of left hip. 03/17/2013.  - Pain control - PT and OT consulted   2. Urticaria - received call regarding rash on upper and lower extremities  - Likely related to post op Ancef as this is only new medication given to patient  - ordered hydroxyzine 10mg  q6 PRN - will add ancef to allergy list  3. Hyponatremia - Na down to 132  this am 03/18/2013 from 139 on 03/17/2013 - pt was NPO as such had decrease in oral solute intake  - encouraged fluid intake  - BMP in am    4. Hypertension - Pt hypertensive overnight 03/17/2013  range  SBP 159-189/ DBP 71-84. - pt better controlled this am 03/18/2013  Range SBP105- 147 / DBP 52-66 - likely related to pain  - Will continue to monitor   5. Hypothyroidism - stable will plan on continuing home regimen.  Code Status: full Family Communication: discussed with patient and family members at bedside.  Disposition Plan: Awaiting ortho evaluation and recommendations.   Consultants:  Ortho: Dr. Shon Baton  Procedures:  Pt underwent Arthroplasy of left hip. 03/17/2013.  Antibiotics:  None  HPI/Subjective: Pt states she is "not doing good" and complains of new rash that has developed overnight.  States rash is all over body and wants itching to stop. Pt states the only new medication given is the antibiotic and has never had this reaction before. Pt states pain is doing well and has soreness when moving from bed to chair. Pt pain controlled with pain medication.   Objective: Filed Vitals:   03/18/13 0812  BP:   Pulse: 78  Temp:   Resp:     Intake/Output Summary (Last 24 hours) at 03/18/13 1304 Last data filed at 03/18/13 0500  Gross per 24 hour  Intake   1500 ml  Output    875 ml  Net    625 ml    There were no vitals filed for this visit.  Exam:   General:  Pt in chair in  NAD, Alert and awake  Cardiovascular: RRR, no MRG. Radial and pedal pulses 2+ with cyanosis or edema  Respiratory:   Regular unlabored breathing. CTA BL, no wheezes  Abdomen: soft, NT, ND. Bowel sounds present x 4  Musculoskeletal: warm and dry with no cyanosis noted   Skin: generalized pale red raised welts in varies sizes over upper and lower extremities, chest, abdomen, groin and back.  Incision site to left hip covered with evidence of active bleeding or ecchymosis.   Data Reviewed: Basic Metabolic Panel:  Recent Labs Lab 03/16/13 1934 03/18/13 0040 03/18/13 0620  NA 139  --  132*  K 3.4*  --  3.9  CL 105  --  98  CO2 20  --  23  GLUCOSE 91  --  126*  BUN 22  --  11  CREATININE 1.03 0.71 0.76  CALCIUM 8.9  --  8.1*   Liver Function Tests: No results found for this basename: AST, ALT, ALKPHOS, BILITOT, PROT, ALBUMIN,  in the last 168 hours No results found for this basename: LIPASE, AMYLASE,  in the last 168 hours No results found for this basename: AMMONIA,  in the last 168 hours CBC:  Recent Labs Lab 03/16/13 1934 03/18/13 0040 03/18/13 0620  WBC  7.5 10.3 7.5  NEUTROABS 4.4  --   --   HGB 12.1 12.6 11.6*  HCT 34.3* 35.6* 32.8*  MCV 88.6 88.6 88.2  PLT 157 130* 133*   Cardiac Enzymes: No results found for this basename: CKTOTAL, CKMB, CKMBINDEX, TROPONINI,  in the last 168 hours BNP (last 3 results) No results found for this basename: PROBNP,  in the last 8760 hours CBG: No results found for this basename: GLUCAP,  in the last 168 hours  Recent Results (from the past 240 hour(s))  SURGICAL PCR SCREEN     Status: None   Collection Time    03/17/13  2:42 AM      Result Value Range Status   MRSA, PCR NEGATIVE  NEGATIVE Final   Staphylococcus aureus NEGATIVE  NEGATIVE Final   Comment:            The Xpert SA Assay (FDA     approved for NASAL specimens     in patients  over 42 years of age),     is one component of     a comprehensive surveillance     program.  Test performance has     been validated by [employer] for patients greater     than or equal to 55 year old.     It is not intended     to diagnose infection nor to     guide or monitor treatment.     Studies: Dg Hip 1 View Left  03/16/2013   CLINICAL DATA:  Larey Seat. Injured left hip.  EXAM: LEFT HIP - 1 VIEW:  COMPARISON:  None.  FINDINGS: There is a displaced femoral neck fracture with lateral displacement. The visualized bony pelvis is intact.  IMPRESSION: Displaced left femoral neck fracture.   Electronically Signed   By: Loralie Champagne M.D.   On: 03/16/2013 19:30   Dg Pelvis Portable  03/17/2013   CLINICAL DATA:  Postop left hip arthroplasty  EXAM: PORTABLE PELVIS  COMPARISON:  None.  FINDINGS: Located bipolar left hip hemiarthroplasty. No evidence of periprosthetic fracture. Expected postoperative soft tissue gas around the left hip.  IMPRESSION: No adverse features related to left hip hemiarthroplasty.   Electronically Signed   By: Tiburcio Pea M.D.   On: 03/17/2013 23:45   Dg Knee Left Port  03/16/2013   CLINICAL DATA:  Larey Seat. Injured left knee.  EXAM: PORTABLE LEFT KNEE - 1-2 VIEW  COMPARISON:  9  FINDINGS: Moderate tricompartmental degenerative changes most notable in the medial compartment. No acute fracture or osteochondral abnormality. Irregularity along the inferior pole of the patella. This appears fairly well corticated and could be an old avulsion injury or remote osteochondral lesion. No joint effusion.  IMPRESSION: Tricompartmental degenerative changes.  Bony density off the lower pole of the patella is most likely a remote avulsion fracture but recommend correlation with clinical findings.   Electronically Signed   By: Loralie Champagne M.D.   On: 03/16/2013 19:33    Scheduled Meds: . donepezil  10 mg Oral QHS  . enoxaparin (LOVENOX) injection  40 mg Subcutaneous Q24H  .  levothyroxine  125 mcg Oral QAC breakfast  . vitamin B-12  1,000 mcg Oral Daily   Continuous Infusions: . sodium chloride 50 mL/hr at 03/18/13 0029    Principal Problem:   Closed left hip fracture Active Problems:   Hypothyroid    Time spent: > 35 minutes    Morgan Weaver, [facility]  [facility] [employer]  [phone]. If 7PM-7AM, please contact night-coverage at [email or web address], password [facility] 03/18/2013, 1:04 PM  LOS: 2 days

## 2013-03-18 NOTE — Anesthesia Postprocedure Evaluation (Signed)
Anesthesia Post Note  Patient: Morgan Weaver  Procedure(s) Performed: Procedure(s) (LRB): ARTHROPLASTY BIPOLAR HIP (Left)  Anesthesia type: General  Patient location: PACU  Post pain: Pain level controlled  Post assessment: Patient's Cardiovascular Status Stable  Last Vitals:  Filed Vitals:   03/18/13 0400  BP:   Pulse:   Temp:   Resp: 18    Post vital signs: Reviewed and stable  Level of consciousness: alert  Complications: No apparent anesthesia complications

## 2013-03-18 NOTE — Progress Notes (Signed)
Subjective: 1 Day Post-Op Procedure(s) (LRB): ARTHROPLASTY BIPOLAR HIP (Left) Patient reports pain as mild.  Pt c/o itching.  Atarax ordered.  No n/v.  Objective: Vital signs in last 24 hours: Temp:  [97.5 F (36.4 C)-99.3 F (37.4 C)] 99.3 F (37.4 C) (10/04 0602) Pulse Rate:  [58-85] 78 (10/04 0812) Resp:  [16-23] 18 (10/04 0602) BP: (105-189)/(52-84) 105/52 mmHg (10/04 0602) SpO2:  [94 %-100 %] 95 % (10/04 0812)  Intake/Output from previous day: 10/03 0701 - 10/04 0700 In: 1500 [I.V.:1500] Out: 875 [Urine:675; Blood:200] Intake/Output this shift:     Recent Labs  03/16/13 1934 03/18/13 0040 03/18/13 0620  HGB 12.1 12.6 11.6*    Recent Labs  03/18/13 0040 03/18/13 0620  WBC 10.3 7.5  RBC 4.02 3.72*  HCT 35.6* 32.8*  PLT 130* 133*    Recent Labs  03/16/13 1934 03/18/13 0040 03/18/13 0620  NA 139  --  132*  K 3.4*  --  3.9  CL 105  --  98  CO2 20  --  23  BUN 22  --  11  CREATININE 1.03 0.71 0.76  GLUCOSE 91  --  126*  CALCIUM 8.9  --  8.1*   No results found for this basename: LABPT, INR,  in the last 72 hours  PE:  left hip dressed and dry.  NVI at L LE.  Assessment/Plan: 1 Day Post-Op Procedure(s) (LRB): ARTHROPLASTY BIPOLAR HIP (Left) Up with therapy  Post hip precautions.    Morgan Weaver 03/18/2013, 1:28 PM

## 2013-03-19 LAB — BASIC METABOLIC PANEL
CO2: 26 mEq/L (ref 19–32)
Calcium: 8.2 mg/dL — ABNORMAL LOW (ref 8.4–10.5)
Chloride: 97 mEq/L (ref 96–112)
Creatinine, Ser: 0.92 mg/dL (ref 0.50–1.10)
GFR calc Af Amer: 65 mL/min — ABNORMAL LOW (ref >90)
GFR calc non Af Amer: 56 mL/min — ABNORMAL LOW (ref >90)
Sodium: 130 mEq/L — ABNORMAL LOW (ref 135–145)

## 2013-03-19 LAB — CBC
MCV: 88.4 fL (ref 78.0–100.0)
Platelets: 129 10*3/uL — ABNORMAL LOW (ref 150–400)
RBC: 3.29 MIL/uL — ABNORMAL LOW (ref 3.87–5.11)
RDW: 12.6 % (ref 11.5–15.5)
WBC: 8.4 10*3/uL (ref 4.0–10.5)

## 2013-03-19 MED ORDER — ENOXAPARIN SODIUM 40 MG/0.4ML ~~LOC~~ SOLN: 40.0000 mg | SUBCUTANEOUS | Status: DC

## 2013-03-19 MED ORDER — LORAZEPAM 2 MG/ML IJ SOLN: 1.0000 mg | Freq: Four times a day (QID) | INTRAMUSCULAR | Status: DC | PRN

## 2013-03-19 MED ORDER — BISACODYL 5 MG PO TBEC
10.0000 mg | DELAYED_RELEASE_TABLET | Freq: Every day | ORAL | Status: DC | PRN
  Administered 2013-03-19: 10 mg via ORAL
  Filled 2013-03-19: qty 2

## 2013-03-19 MED ORDER — ACETAMINOPHEN 325 MG PO TABS: 650.0000 mg | ORAL_TABLET | Freq: Four times a day (QID) | ORAL | Status: DC | PRN

## 2013-03-19 MED ORDER — POLYETHYLENE GLYCOL 3350 17 G PO PACK: 17.0000 g | PACK | Freq: Two times a day (BID) | ORAL | Status: DC | PRN

## 2013-03-19 MED ORDER — LORAZEPAM 1 MG PO TABS: 1.0000 mg | ORAL_TABLET | Freq: Four times a day (QID) | ORAL | Status: DC | PRN

## 2013-03-19 NOTE — Progress Notes (Signed)
Orthopedics Progress Note  Subjective: I feel better today.  My hip still hurts  Objective:  Filed Vitals:   03/19/13 0637  BP: 133/56  Pulse: 72  Temp: 98.3 F (36.8 C)  Resp: 18    General: Awake and alert  Musculoskeletal: Left hip incision clean, dry, intact.  Knee immobilizer removed(only needs to be on at night) skin ok Neurovascularly intact  Lab Results  Component Value Date   WBC 8.4 03/19/2013   HGB 10.2* 03/19/2013   HCT 29.1* 03/19/2013   MCV 88.4 03/19/2013   PLT 129* 03/19/2013       Component Value Date/Time   NA 130* 03/19/2013 0700   K 3.8 03/19/2013 0700   CL 97 03/19/2013 0700   CO2 26 03/19/2013 0700   GLUCOSE 112* 03/19/2013 0700   BUN 12 03/19/2013 0700   CREATININE 0.92 03/19/2013 0700   CALCIUM 8.2* 03/19/2013 0700   GFRNONAA 56* 03/19/2013 0700   GFRAA 65* 03/19/2013 0700    Lab Results  Component Value Date   INR 1.0 01/12/2008    Assessment/Plan: POD #2 s/p Procedure(s): ARTHROPLASTY MONOPOLAR HIP PT, OT, D/C planning, patient will need SNF (FL-2) Hgb stable Mechanical DVT prophylaxis and Lovenox Acute Blood Loss anemia, asymptomatic  Almedia Balls. Ranell Patrick, MD 03/19/2013 9:56 AM

## 2013-03-19 NOTE — Progress Notes (Addendum)
Clinical Social Work Department CLINICAL SOCIAL WORK PLACEMENT NOTE 03/19/2013  Patient:  Morgan Weaver, Morgan Weaver  Account Number:  [id number] Admit date:  03/16/2013  Clinical Social Worker:  Jetta Lout, Theresia Majors  Date/time:  03/19/2013 05:42 PM  Clinical Social Work is seeking post-discharge placement for this patient at the following level of care:   SKILLED NURSING   (*CSW will update this form in Epic as items are completed)   03/19/2013  Patient/family provided with [facility] Department of Clinical Social Work's list of facilities offering this level of care within the geographic area requested by the patient (or if unable, by the patient's family).  03/19/2013  Patient/family informed of their freedom to choose among providers that offer the needed level of care, that participate in Medicare, Medicaid or managed care program needed by the patient, have an available bed and are willing to accept the patient.  03/19/2013  Patient/family informed of [facility]' ownership interest in [facility], as well as of the fact that they are under no obligation to receive care at this facility.  PASARR submitted to EDS on 03/19/2013 PASARR number received from EDS on   FL2 transmitted to all facilities in geographic area requested by pt/family on  03/19/2013 FL2 transmitted to all facilities within larger geographic area on   Patient informed that his/her managed care company has contracts with or will negotiate with  certain facilities, including the following:     Patient/family informed of bed offers received:  03/20/13 Patient chooses bed at [facility]  Physician recommends and patient chooses bed at    Patient to be transferred to  on  03/20/13 Patient to be transferred to facility by [facility]  The following physician request were entered in Epic:   Additional Comments: Son's reported that their first choice is [facility].

## 2013-03-19 NOTE — Progress Notes (Signed)
[facility] PROGRESS NOTE  Morgan Weaver MWU:[id number] DOB: Aug 19, 1928 DOA: 03/16/2013 PCP: Kaleen Mask, MD  Assessment/Plan: 1. Closed left hip fracture - hip x ray reports left femoral neck fracture. - Pt underwent Arthroplasy of left hip. 03/17/2013.  - Pain control - PT recommends patient goes to SNF   2. Urticaria - Resolved after hydroxyzine - Likely related to post op Ancef as this is only new medication given to patient  - Ancef added to allergy list   3. Hyponatremia - Na  down to 130  this am 03/19/2013 from 132 on 03/18/2013 - Pt with decreased oral intake - encouraged po intake  - BMP in am    4. Hypertension - Better control this am with sbp 117-133/ dbp 56-63  - Will continue to monitor   5. Hypothyroidism - stable  continuing home regimen.  Code Status: full Family Communication: discussed with patient and family members at bedside.  Disposition Plan: To SNF once cleared by ortho and bed available   Consultants:  Ortho: Dr. Shon Baton  Procedures:  Pt underwent Arthroplasy of left hip. 03/17/2013.  Antibiotics:  None  HPI/Subjective: Pt states she is doing better this am and no longer itching. States rash went away a couple of hours after taking allergy medication.  Pt states pain is doing well and has soreness when moving in bed. Pain currently rated 3 on 0-10 scale while laying in bed. Pt pain controlled with pain medication.   Objective: Filed Vitals:   03/19/13 0637  BP: 133/56  Pulse: 72  Temp: 98.3 F (36.8 C)  Resp: 18    Intake/Output Summary (Last 24 hours) at 03/19/13 0936 Last data filed at 03/19/13 0647  Gross per 24 hour  Intake    120 ml  Output   1100 ml  Net   -980 ml   There were no vitals filed for this visit.  Exam:   General:  Pt in bed in NAD, Alert and awake  Cardiovascular: RRR, no MRG. Radial and pedal pulses 2+ with cyanosis or edema  Respiratory:   Regular unlabored breathing. CTA BL, no  wheezes, crackles or rhonchi  Abdomen: soft, NT, ND. Bowel sounds present x 4  Musculoskeletal: warm and dry with no cyanosis noted. Left lower extremity less than right lower extremity.   Skin:   Incision site to left hip covered with no evidence of active bleeding or ecchymosis. Sore to touch.   Data Reviewed: Basic Metabolic Panel:  Recent Labs Lab 03/16/13 1934 03/18/13 0040 03/18/13 0620 03/19/13 0700  NA 139  --  132* 130*  K 3.4*  --  3.9 3.8  CL 105  --  98 97  CO2 20  --  23 26  GLUCOSE 91  --  126* 112*  BUN 22  --  11 12  CREATININE 1.03 0.71 0.76 0.92  CALCIUM 8.9  --  8.1* 8.2*   Liver Function Tests: No results found for this basename: AST, ALT, ALKPHOS, BILITOT, PROT, ALBUMIN,  in the last 168 hours No results found for this basename: LIPASE, AMYLASE,  in the last 168 hours No results found for this basename: AMMONIA,  in the last 168 hours CBC:  Recent Labs Lab 03/16/13 1934 03/18/13 0040 03/18/13 0620 03/19/13 0700  WBC 7.5 10.3 7.5 8.4  NEUTROABS 4.4  --   --   --   HGB 12.1 12.6 11.6* 10.2*  HCT 34.3* 35.6* 32.8* 29.1*  MCV 88.6 88.6 88.2 88.4  PLT  157 130* 133* 129*   Cardiac Enzymes: No results found for this basename: CKTOTAL, CKMB, CKMBINDEX, TROPONINI,  in the last 168 hours BNP (last 3 results) No results found for this basename: PROBNP,  in the last 8760 hours CBG: No results found for this basename: GLUCAP,  in the last 168 hours  Recent Results (from the past 240 hour(s))  SURGICAL PCR SCREEN     Status: None   Collection Time    03/17/13  2:42 AM      Result Value Range Status   MRSA, PCR NEGATIVE  NEGATIVE Final   Staphylococcus aureus NEGATIVE  NEGATIVE Final   Comment:            The Xpert SA Assay (FDA     approved for NASAL specimens     in patients over 110 years of age),     is one component of     a comprehensive surveillance     program.  Test performance has     been validated by [employer] for patients  greater     than or equal to 65 year old.     It is not intended     to diagnose infection nor to     guide or monitor treatment.     Studies: Dg Pelvis Portable  03/17/2013   CLINICAL DATA:  Postop left hip arthroplasty  EXAM: PORTABLE PELVIS  COMPARISON:  None.  FINDINGS: Located bipolar left hip hemiarthroplasty. No evidence of periprosthetic fracture. Expected postoperative soft tissue gas around the left hip.  IMPRESSION: No adverse features related to left hip hemiarthroplasty.   Electronically Signed   By: Tiburcio Pea M.D.   On: 03/17/2013 23:45    Scheduled Meds: . donepezil  10 mg Oral QHS  . enoxaparin (LOVENOX) injection  40 mg Subcutaneous Q24H  . levothyroxine  125 mcg Oral QAC breakfast  . vitamin B-12  1,000 mcg Oral Daily   Continuous Infusions: . sodium chloride 50 mL/hr (03/18/13 2125)    Principal Problem:   Closed left hip fracture Active Problems:   Hypertension   Hypothyroid   Rash   Hyponatremia    Time spent: > 35 minutes    Morgan Weaver  [facility] Pager [phone]. If 7PM-7AM, please contact night-coverage at [email or web address], password [facility] 03/19/2013, 9:36 AM  LOS: 3 days

## 2013-03-19 NOTE — Progress Notes (Signed)
Clinical Social Work Department BRIEF PSYCHOSOCIAL ASSESSMENT 03/19/2013  Patient:  Morgan Weaver, Morgan Weaver     Account Number:  [id number]     Admit date:  03/16/2013  Clinical Social Worker:  Hendricks Milo  Date/Time:  03/19/2013 05:34 PM  Referred by:  Physician  Date Referred:  03/19/2013 Referred for  SNF Placement   Other Referral:   Interview type:  Family Other interview type:   Patient's three son's were at bediside. Lamonte Richer, and her youngest son.    PSYCHOSOCIAL DATA Living Status:  WITH ADULT CHILDREN Admitted from facility:   Level of care:   Primary support name:  Morgan Weaver Primary support relationship to patient:  CHILD, ADULT Degree of support available:   All three sons are very supportive.    CURRENT CONCERNS  Other Concerns:    SOCIAL WORK ASSESSMENT / PLAN Son's agreered that [facility] in [place] is the first choice. Patient and son's agreeable to SNF search in [facility].   Assessment/plan status:  Psychosocial Support/Ongoing Assessment of Needs Other assessment/ plan:   Information/referral to community resources:   CSW gave son's SNF list.    PATIENT'S/FAMILY'S RESPONSE TO PLAN OF CARE: Production assistant, radio of social worker and agreeable to SNF search in [place]. First choice is [facility].

## 2013-03-20 ENCOUNTER — Encounter (HOSPITAL_COMMUNITY): Payer: Self-pay | Admitting: General Practice

## 2013-03-20 DIAGNOSIS — L290 Pruritus ani: Secondary | ICD-10-CM

## 2013-03-20 DIAGNOSIS — N390 Urinary tract infection, site not specified: Secondary | ICD-10-CM

## 2013-03-20 LAB — CBC
HCT: 27.6 % — ABNORMAL LOW (ref 36.0–46.0)
Hemoglobin: 9.5 g/dL — ABNORMAL LOW (ref 12.0–15.0)
MCV: 89 fL (ref 78.0–100.0)
Platelets: 128 10*3/uL — ABNORMAL LOW (ref 150–400)
RBC: 3.1 MIL/uL — ABNORMAL LOW (ref 3.87–5.11)
RDW: 12.8 % (ref 11.5–15.5)
WBC: 7.5 10*3/uL (ref 4.0–10.5)

## 2013-03-20 MED ORDER — BISACODYL 5 MG PO TBEC: 10.0000 mg | DELAYED_RELEASE_TABLET | Freq: Every day | ORAL | Status: DC | PRN

## 2013-03-20 MED ORDER — HYDROCODONE-ACETAMINOPHEN 5-325 MG PO TABS: 1.0000 | ORAL_TABLET | Freq: Four times a day (QID) | ORAL | Status: DC | PRN

## 2013-03-20 MED ORDER — ACETAMINOPHEN 325 MG PO TABS
325.0000 mg | ORAL_TABLET | Freq: Four times a day (QID) | ORAL | Status: DC | PRN
Start: 2013-03-20 — End: 2014-01-29

## 2013-03-20 NOTE — Progress Notes (Signed)
Orthopedics Progress Note  Subjective: Stable overnight, ready to go to rehab  Objective:  Filed Vitals:   03/19/13 2148  BP: 135/62  Temp: 98.2 F (36.8 C)  Resp: 18    General: Awake and alert  Musculoskeletal: left hip incision clean and dry, mepilex in place Neurovascularly intact  Lab Results  Component Value Date   WBC 7.5 03/20/2013   HGB 9.5* 03/20/2013   HCT 27.6* 03/20/2013   MCV 89.0 03/20/2013   PLT 128* 03/20/2013       Component Value Date/Time   NA 130* 03/19/2013 0700   K 3.8 03/19/2013 0700   CL 97 03/19/2013 0700   CO2 26 03/19/2013 0700   GLUCOSE 112* 03/19/2013 0700   BUN 12 03/19/2013 0700   CREATININE 0.92 03/19/2013 0700   CALCIUM 8.2* 03/19/2013 0700   GFRNONAA 56* 03/19/2013 0700   GFRAA 65* 03/19/2013 0700    Lab Results  Component Value Date   INR 1.0 01/12/2008    Assessment/Plan: POD #3 s/p Procedure(s): ARTHROPLASTY BIPOLAR HIP Continue Pt, OT, D/C planning, FL-2 signed   Viviann Spare R. Ranell Patrick, MD 03/20/2013 11:06 AM

## 2013-03-20 NOTE — Progress Notes (Signed)
Physical Therapy Treatment Patient Details Name: Morgan Weaver MRN: [id number] DOB: [birth year] Today's Date: 03/20/2013 Time: 6578-4696 PT Time Calculation (min): 15 min  PT Assessment / Plan / Recommendation  History of Present Illness Pt. was admitted following a fall resulting in L femoral neck fx.  she underwent bipolar hip arthroplasty.     PT Comments   Pt making progress with mobility but still limited by pain & weakness.    Follow Up Recommendations  SNF;Supervision/Assistance - 24 hour;Supervision for mobility/OOB     Does the patient have the potential to tolerate intense rehabilitation     Barriers to Discharge        Equipment Recommendations  None recommended by PT    Recommendations for Other Services    Frequency Min 6X/week   Progress towards PT Goals Progress towards PT goals: Progressing toward goals  Plan Current plan remains appropriate    Precautions / Restrictions Precautions Precautions: Fall;Posterior Hip;Other (comment) Required Braces or Orthoses: Knee Immobilizer - Left Restrictions LLE Weight Bearing: Weight bearing as tolerated       Mobility  Bed Mobility Bed Mobility: Supine to Sit;Sitting - Scoot to Edge of Bed Supine to Sit: 2: Max assist;HOB elevated;With rails Sitting - Scoot to Edge of Bed: 2: Max assist Details for Bed Mobility Assistance: Max directional cues for sequencing & technique.  (A) for LLE management, to lift shoulders/trunk to sitting upright, & use of draw pad to pivot hips around to EOB as well as scoot closer to EOB Transfers Transfers: Sit to Stand;Stand to Sit;Stand Pivot Transfers Sit to Stand: 1: +2 Total assist;With upper extremity assist;From bed Sit to Stand: Patient Percentage: 60% Stand to Sit: 1: +2 Total assist;With upper extremity assist;With armrests;To chair/3-in-1 Stand to Sit: Patient Percentage: 60% Stand Pivot Transfers: 1: +2 Total assist Stand Pivot Transfers: Patient Percentage: 60% Details for  Transfer Assistance: Cues for hand placement & technique. Pt able to take pivotal steps around to recliner although painful.  Pt also with incresaed anxiety as she keeps stating "I'm going to fall.  My leg is collapsing"      PT Goals (current goals can now be found in the care plan section) Acute Rehab PT Goals PT Goal Formulation: With patient Time For Goal Achievement: 03/25/13 Potential to Achieve Goals: Fair  Visit Information  Last PT Received On: 03/20/13 Assistance Needed: +2 History of Present Illness: Pt. was admitted following a fall resulting in L femoral neck fx.  she underwent bipolar hip arthroplasty.      Subjective Data      Cognition  Cognition Arousal/Alertness: Awake/alert Behavior During Therapy: WFL for tasks assessed/performed    Balance     End of Session PT - End of Session Equipment Utilized During Treatment: Gait belt Activity Tolerance: Patient tolerated treatment well Patient left: in chair;with call bell/phone within reach Nurse Communication: Mobility status   GP     Lara Mulch 03/20/2013, 1:09 PM  Verdell Face, PTA [phone] 03/20/2013

## 2013-03-20 NOTE — Discharge Summary (Signed)
Physician Discharge Summary  Morgan Weaver ZOX:[id number] DOB: 1929-01-21 DOA: 03/16/2013  PCP: Kaleen Mask, MD  Admit date: 03/16/2013 Discharge date: 03/20/2013  Time spent: > 35 minutes  Recommendations for Outpatient Follow-up:  1. Please be sure to follow up with your orthopaedic surgeon in 1-2 weeks or sooner should any new concerns arise. 2. On f/u consider reassessing sodium levels.  Discharge Diagnoses:  Principal Problem:   Closed left hip fracture Active Problems:   Hypertension   Hypothyroid   Rash   Hyponatremia   Discharge Condition: stable  Diet recommendation: Low sodium heart healthy  There were no vitals filed for this visit.  History of present illness:  79 y/o with h/o dementia that presented after a mechanical fall with a closed left hip fracture  Hospital Course:   1. Closed left hip fracture - hip x ray reports left femoral neck fracture.  - Pt underwent Arthroplasy of left hip. 03/17/2013.  - Pain control with tylenol  -PT and OT working with pt.  - PT recommends patient goes to SNF  -Ortho recommends continued PT/OT at SNF  2. Urticaria  - Resolved after hydroxyzine  - Likely related to post op Ancef as this is only new medication given to patient  - Ancef added to allergy list    3. Hyponatremia  - Most likely due to poor oral solute intake. - encouraged po intake  - BMP in AM   4. Hypertension  - Relatively well controlled with sbp 133-135 / dbp 56-62   5. Hypothyroidism  - stable continuing home regimen on discharge.   Procedures: As listed above  Consultations:  Ortho: Dr. Ranell Patrick  Discharge Exam: Filed Vitals:   03/19/13 2148  BP: 135/62  Temp: 98.2 F (36.8 C)  Resp: 18    General: Pt in NAD, Alert and awake Cardiovascular: RRR, no MRG Respiratory: CTA BL, no wheezes  Discharge Instructions  Discharge Orders   Future Appointments Provider Department Dept Phone   07/25/2013 9:30 AM Omelia Blackwater,  DO [facility] [phone]   Future Orders Complete By Expires   Call MD for:  severe uncontrolled pain  As directed    Call MD for:  temperature >100.4  As directed    Diet - low sodium heart healthy  As directed    Increase activity slowly  As directed    Weight bearing as tolerated  As directed    Comments:     Left le   Weight bearing as tolerated  As directed        Medication List    TAKE these medications       acetaminophen 325 MG tablet  Commonly known as:  TYLENOL  Take 1 tablet (325 mg total) by mouth every 6 (six) hours as needed for pain.     bisacodyl 5 MG EC tablet  Commonly known as:  DULCOLAX  Take 2 tablets (10 mg total) by mouth daily as needed.     donepezil 10 MG tablet  Commonly known as:  ARICEPT  Take 1 tablet (10 mg total) by mouth at bedtime.     enoxaparin 40 MG/0.4ML injection  Commonly known as:  LOVENOX  Inject 0.4 mLs (40 mg total) into the skin daily. 30 days post op     enoxaparin 40 MG/0.4ML injection  Commonly known as:  LOVENOX  Inject 0.4 mLs (40 mg total) into the skin daily. 30 days post op     HYDROcodone-acetaminophen 5-325 MG per  tablet  Commonly known as:  NORCO/VICODIN  Take 1-2 tablets by mouth every 6 (six) hours as needed.     levothyroxine 125 MCG tablet  Commonly known as:  SYNTHROID, LEVOTHROID  Take 125 mcg by mouth daily before breakfast.      ASK your doctor about these medications       cyanocobalamin 1000 MCG/ML injection  Commonly known as:  (VITAMIN B-12)  Inject 1 mL (1,000 mcg total) into the skin once. injection once daily x 5 days, then once weekly for 4 weeks, then once a month  Ask about: Which instructions should I use?     vitamin B-12 1000 MCG tablet  Commonly known as:  CYANOCOBALAMIN  Take 1,000 mcg by mouth daily.  Ask about: Which instructions should I use?       Allergies  Allergen Reactions  . Ancef [Cefazolin]     Diffuse rash with urticaria, no  respiratory involvement.  . Mobic [Meloxicam]     Abdominal pain, ?GI ulcer    . Sulfa Drugs Cross Reactors     unknown  . Tetracyclines & Related Swelling    Lips swell When asked 6/4 patient states unknown reaction       Follow-up Information   Follow up with NORRIS,STEVEN R, MD. Call in 2 weeks. [phone])    Specialty:  Orthopedic Surgery   Contact information:   [address] [place] [address] [phone]        The results of significant diagnostics from this hospitalization (including imaging, microbiology, ancillary and laboratory) are listed below for reference.    Significant Diagnostic Studies: Dg Hip 1 View Left  03/16/2013   CLINICAL DATA:  Larey Seat. Injured left hip.  EXAM: LEFT HIP - 1 VIEW:  COMPARISON:  None.  FINDINGS: There is a displaced femoral neck fracture with lateral displacement. The visualized bony pelvis is intact.  IMPRESSION: Displaced left femoral neck fracture.   Electronically Signed   By: Loralie Champagne M.D.   On: 03/16/2013 19:30   Mr Brain Wo Contrast  03/02/2013   [facility]  NEUROIMAGING REPORT   STUDY DATE: 03/02/13 PATIENT NAME: Morgan Weaver DOB: 1928-08-30 MRN: [id number]  ORDERING CLINICIAN: Elspeth Cho, DO  CLINICAL HISTORY: 79 year old female with memory loss.  EXAM: MRI brain (without)  TECHNIQUE: MRI of the brain without contrast was obtained utilizing 5 mm  axial slices with T1, T2, T2 flair, SWI and diffusion weighted views.  T1  sagittal and T2 coronal views were obtained. CONTRAST: no IMAGING SITE: [employer] (3 Tesla MRI)   FINDINGS:  No abnormal lesions are seen on diffusion-weighted views to suggest acute  ischemia. The cortical sulci, fissures and cisterns are notable for  moderate diffuse atrophy. Lateral, third and fourth ventricle are  moderately enlarged. No extra-axial fluid collections are seen. No  evidence of mass effect or midline shift.  Moderate periventricular,   subcortical, and pontine chronic small vessel ischemic disease.  On sagittal views the posterior fossa, pituitary gland and corpus callosum  are unremarkable. No evidence of intracranial hemorrhage on SWI views. The  orbits and their contents, paranasal sinuses and calvarium are notable for  post-surgical orbits.  Intracranial flow voids are present.   03/02/2013   Abnormal MRI brain (without) demonstrating: 1. Moderate cortical and subcortical atrophy. 2. Moderate chronic small vessel ischemic disease. 3. Compared to prior MRI on 05/08/09, there has been some progression of  atrophy and chronic small  vessel ischemic disease.   INTERPRETING PHYSICIAN:  Suanne Marker, MD Certified in Neurology, Neurophysiology and Neuroimaging  [facility] [address], [address] [place], [place] [address] [phone]   Dg Pelvis Portable  03/17/2013   CLINICAL DATA:  Postop left hip arthroplasty  EXAM: PORTABLE PELVIS  COMPARISON:  None.  FINDINGS: Located bipolar left hip hemiarthroplasty. No evidence of periprosthetic fracture. Expected postoperative soft tissue gas around the left hip.  IMPRESSION: No adverse features related to left hip hemiarthroplasty.   Electronically Signed   By: Tiburcio Pea M.D.   On: 03/17/2013 23:45   Dg Knee Left Port  03/16/2013   CLINICAL DATA:  Larey Seat. Injured left knee.  EXAM: PORTABLE LEFT KNEE - 1-2 VIEW  COMPARISON:  9  FINDINGS: Moderate tricompartmental degenerative changes most notable in the medial compartment. No acute fracture or osteochondral abnormality. Irregularity along the inferior pole of the patella. This appears fairly well corticated and could be an old avulsion injury or remote osteochondral lesion. No joint effusion.  IMPRESSION: Tricompartmental degenerative changes.  Bony density off the lower pole of the patella is most likely a remote avulsion fracture but recommend correlation with clinical findings.   Electronically Signed   By: Loralie Champagne M.D.   On: 03/16/2013 19:33    Microbiology: Recent Results (from the past 240 hour(s))  SURGICAL PCR SCREEN     Status: None   Collection Time    03/17/13  2:42 AM      Result Value Range Status   MRSA, PCR NEGATIVE  NEGATIVE Final   Staphylococcus aureus NEGATIVE  NEGATIVE Final   Comment:            The Xpert SA Assay (FDA     approved for NASAL specimens     in patients over 23 years of age),     is one component of     a comprehensive surveillance     program.  Test performance has     been validated by [employer] for patients greater     than or equal to 106 year old.     It is not intended     to diagnose infection nor to     guide or monitor treatment.     Labs: Basic Metabolic Panel:  Recent Labs Lab 03/16/13 1934 03/18/13 0040 03/18/13 0620 03/19/13 0700  NA 139  --  132* 130*  K 3.4*  --  3.9 3.8  CL 105  --  98 97  CO2 20  --  23 26  GLUCOSE 91  --  126* 112*  BUN 22  --  11 12  CREATININE 1.03 0.71 0.76 0.92  CALCIUM 8.9  --  8.1* 8.2*   Liver Function Tests: No results found for this basename: AST, ALT, ALKPHOS, BILITOT, PROT, ALBUMIN,  in the last 168 hours No results found for this basename: LIPASE, AMYLASE,  in the last 168 hours No results found for this basename: AMMONIA,  in the last 168 hours CBC:  Recent Labs Lab 03/16/13 1934 03/18/13 0040 03/18/13 0620 03/19/13 0700 03/20/13 0540  WBC 7.5 10.3 7.5 8.4 7.5  NEUTROABS 4.4  --   --   --   --   HGB 12.1 12.6 11.6* 10.2* 9.5*  HCT 34.3* 35.6* 32.8* 29.1* 27.6*  MCV 88.6 88.6 88.2 88.4 89.0  PLT 157 130* 133* 129* 128*   Cardiac Enzymes: No results found for this basename:  CKTOTAL, CKMB, CKMBINDEX, TROPONINI,  in the last 168 hours BNP: BNP (last 3 results) No results found for this basename: PROBNP,  in the last 8760 hours CBG: No results found for this basename: GLUCAP,  in the last 168 hours     Signed:  Penny Pia  [facility] 03/20/2013, 12:16  PM

## 2013-03-20 NOTE — Progress Notes (Signed)
Increased agitation as night progressed,uncooperative,agitated,stated" we were holding her captive,and Obama was up to no good oon TV news Stated she was at a motel at [place] being held a prisoner.has called hospital operator several times with previous statements.Asking for deceased husband by name who passed away 5 yrs ago.called son who spoke with her over the phone,then came here to sit with her a while.has had no narcotics since before surgery.tylenol given for c/o pain and fever discomfort. Noted in hx that pt drinks 14 glasses of wine a week or 2/3 per day.[facility] called,[facility] protocol started.verbally abusive to staff,and very agitated.Has been incont at times plus voided on bedpan.Linward Headland D

## 2013-03-21 ENCOUNTER — Encounter (HOSPITAL_COMMUNITY): Payer: Self-pay | Admitting: Orthopedic Surgery

## 2013-03-27 ENCOUNTER — Telehealth: Payer: Self-pay | Admitting: Neurology

## 2013-03-27 ENCOUNTER — Encounter: Payer: Self-pay | Admitting: Neurology

## 2013-03-27 NOTE — Telephone Encounter (Signed)
Letter written and routed to triage pool. Thanks.

## 2013-03-28 DIAGNOSIS — Z0289 Encounter for other administrative examinations: Secondary | ICD-10-CM

## 2013-03-28 NOTE — Progress Notes (Signed)
Clinical social worker assisted with patient discharge to skilled nursing facility, [facility].  CSW addressed all family questions and concerns. CSW copied chart and added all important documents. CSW also set up patient transportation with [facility]. Clinical Social Worker will sign off for now as social work intervention is no longer needed.   Wynter Isaacs, MSW, LCSWA [phone] 

## 2013-04-02 ENCOUNTER — Emergency Department (HOSPITAL_COMMUNITY)
Admission: EM | Admit: 2013-04-02 | Discharge: 2013-04-02 | Disposition: A | Discharge status: Home / Self Care | Payer: Medicare Other | Attending: Emergency Medicine | Admitting: Emergency Medicine

## 2013-04-02 DIAGNOSIS — Z79899 Other long term (current) drug therapy: Secondary | ICD-10-CM | POA: Insufficient documentation

## 2013-04-02 DIAGNOSIS — Z87891 Personal history of nicotine dependence: Secondary | ICD-10-CM | POA: Insufficient documentation

## 2013-04-02 DIAGNOSIS — Z8742 Personal history of other diseases of the female genital tract: Secondary | ICD-10-CM | POA: Insufficient documentation

## 2013-04-02 DIAGNOSIS — M129 Arthropathy, unspecified: Secondary | ICD-10-CM | POA: Insufficient documentation

## 2013-04-02 DIAGNOSIS — L509 Urticaria, unspecified: Secondary | ICD-10-CM

## 2013-04-02 DIAGNOSIS — L500 Allergic urticaria: Secondary | ICD-10-CM | POA: Insufficient documentation

## 2013-04-02 DIAGNOSIS — Z8639 Personal history of other endocrine, nutritional and metabolic disease: Secondary | ICD-10-CM | POA: Insufficient documentation

## 2013-04-02 DIAGNOSIS — E039 Hypothyroidism, unspecified: Secondary | ICD-10-CM | POA: Insufficient documentation

## 2013-04-02 DIAGNOSIS — Z8619 Personal history of other infectious and parasitic diseases: Secondary | ICD-10-CM | POA: Insufficient documentation

## 2013-04-02 DIAGNOSIS — I10 Essential (primary) hypertension: Secondary | ICD-10-CM | POA: Insufficient documentation

## 2013-04-02 DIAGNOSIS — Z862 Personal history of diseases of the blood and blood-forming organs and certain disorders involving the immune mechanism: Secondary | ICD-10-CM | POA: Insufficient documentation

## 2013-04-02 MED ORDER — PREDNISONE 20 MG PO TABS: 40.0000 mg | ORAL_TABLET | Freq: Every day | ORAL | Status: DC

## 2013-04-02 MED ORDER — METHYLPREDNISOLONE SODIUM SUCC 125 MG IJ SOLR
125.0000 mg | Freq: Once | INTRAMUSCULAR | Status: AC
  Administered 2013-04-02: 125 mg via INTRAVENOUS
  Filled 2013-04-02: qty 2

## 2013-04-02 NOTE — ED Notes (Signed)
Pt sleeping soundly, O2 94% RA. No acute distress noted at this moment.

## 2013-04-02 NOTE — ED Notes (Signed)
PER EMS: pt from [facility] nursing home, EMS called because pt noted to have red, raised rash scattered throughout body across abdomen, back, under right upper arm, upper legs, and bottom area. Airway intact, pt talking clear and concise sentences. Hx of dementia, alert to baseline. Given 0.15 epi,  50 zantac, 50 of benadryl PTA by EMS. VS: Bp-168/97, HR-62, O2-98%, RR-16. 22g L hand by EMS.

## 2013-04-02 NOTE — ED Notes (Signed)
Pt son stated he was going to go home and to call if needed.

## 2013-04-02 NOTE — ED Notes (Signed)
PTAR at bedside to transport. 

## 2013-04-02 NOTE — ED Provider Notes (Signed)
TIME SEEN: 3:45 AM  CHIEF COMPLAINT: Hives  HPI: Patient is an 79 year old female with a history of hypertension, hyperlipidemia, hypothyroidism who presents emergency department with diffuse rash that is paretic. Patient reports that she called the nursing home staff because she began itching tonight. They called EMS. Upon their arrival, patient airway is intact, no complaints of shortness of breath, she was dynamically stable. They gave 0.15 mg of IM epinephrine, 50 mg of Zantac, 50 mg of Benadryl. Her hives are improving. She denies any respiratory distress, shortness of breath, swelling of her lips or tongue, difficulty swallowing, dizziness. She's been hemodynamically stable. Denies any new soaps, lotions, detergents, medications.  ROS: See HPI Constitutional: no fever  Eyes: no drainage  ENT: no runny nose   Cardiovascular:  no chest pain  Resp: no SOB  GI: no vomiting GU: no dysuria Integumentary: no rash  Allergy: hives  Musculoskeletal: no leg swelling  Neurological: no slurred speech ROS otherwise negative  PAST MEDICAL HISTORY/PAST SURGICAL HISTORY:  Past Medical History  Diagnosis Date  . Torn rotator cuff   . Ovarian cyst   . Lyme disease   . Hypertension   . Hemorrhoid   . Arthritis   . Weight decrease   . Hyperlipidemia   . Thyroid disease     hypothyroidism    MEDICATIONS:  Prior to Admission medications   Medication Sig Start Date End Date Taking? Authorizing Provider  acetaminophen (TYLENOL) 325 MG tablet Take 1 tablet (325 mg total) by mouth every 6 (six) hours as needed for pain. 03/20/13   Penny Pia, MD  bisacodyl (DULCOLAX) 5 MG EC tablet Take 2 tablets (10 mg total) by mouth daily as needed. 03/20/13   Penny Pia, MD  cyanocobalamin (,VITAMIN B-12,) 1000 MCG/ML injection Inject 1 mL (1,000 mcg total) into the skin once. injection once daily x 5 days, then once weekly for 4 weeks, then once a month 02/23/13   Omelia Blackwater, DO  donepezil  (ARICEPT) 10 MG tablet Take 1 tablet (10 mg total) by mouth at bedtime. 03/06/13   Omelia Blackwater, DO  enoxaparin (LOVENOX) 40 MG/0.4ML injection Inject 0.4 mLs (40 mg total) into the skin daily. 30 days post op 03/17/13   Verlee Rossetti, MD  enoxaparin (LOVENOX) 40 MG/0.4ML injection Inject 0.4 mLs (40 mg total) into the skin daily. 30 days post op 03/19/13   Verlee Rossetti, MD  HYDROcodone-acetaminophen (NORCO/VICODIN) 5-325 MG per tablet Take 1-2 tablets by mouth every 6 (six) hours as needed. 03/20/13   Penny Pia, MD  levothyroxine (SYNTHROID, LEVOTHROID) 125 MCG tablet Take 125 mcg by mouth daily before breakfast.     Historical Provider, MD  vitamin B-12 (CYANOCOBALAMIN) 1000 MCG tablet Take 1,000 mcg by mouth daily.    Historical Provider, MD    ALLERGIES:  Allergies  Allergen Reactions  . Ancef [Cefazolin]     Diffuse rash with urticaria, no respiratory involvement.  . Mobic [Meloxicam]     Abdominal pain, ?GI ulcer    . Sulfa Drugs Cross Reactors     unknown  . Tetracyclines & Related Swelling    Lips swell When asked 6/4 patient states unknown reaction    SOCIAL HISTORY:  History  Substance Use Topics  . Smoking status: Former Games developer  . Smokeless tobacco: Never Used     Comment: quit-1977  . Alcohol Use: 8.4 oz/week    14 Glasses of wine per week     Comment: BEFORE DINNER  FAMILY HISTORY: Family History  Problem Relation Age of Onset  . Heart disease Father     heart attack  . Hypertension Father   . Other Son     brain tumor     EXAM: BP 181/74  Pulse 60  Temp(Src) 98.4 F (36.9 C) (Oral)  Resp 18  SpO2 100% CONSTITUTIONAL: Alert and oriented and responds appropriately to questions. Well-appearing; well-nourished HEAD: Normocephalic EYES: Conjunctivae clear, PERRL ENT: normal nose; no rhinorrhea; moist mucous membranes; pharynx without lesions noted; no angioedema, no drooling, no trismus NECK: Supple, no meningismus, no LAD  CARD: RRR; S1  and S2 appreciated; no murmurs, no clicks, no rubs, no gallops RESP: Normal chest excursion without splinting or tachypnea; breath sounds clear and equal bilaterally; no wheezes, no rhonchi, no rales, no stridor ABD/GI: Normal bowel sounds; non-distended; soft, non-tender, no rebound, no guarding BACK:  The back appears normal and is non-tender to palpation, there is no CVA tenderness EXT: Normal ROM in all joints; non-tender to palpation; no edema; normal capillary refill; no cyanosis    SKIN: Normal color for age and race; warm; diffuse urticaria NEURO: Moves all extremities equally PSYCH: The patient's mood and manner are appropriate. Grooming and personal hygiene are appropriate.  MEDICAL DECISION MAKING: Patient here with diffuse urticaria. She is hemodynamically stable. She has received Zantac, epinephrine, Benadryl by EMS in her hives are improving. Will give Solu-Medrol. We'll continue to monitor for 4 hours given she was given epinephrine.  ED PROGRESS: Patient is still stable. Vitals unremarkable. Lungs are clear. No angioedema. Hives have resolved. We'll discharge him with return precautions, PCP followup, prescription for steroids. Patient verbalizes understanding and is comfortable this plan.     Layla Maw Obaloluwa Delatte, DO 04/02/13 [phone]

## 2013-04-02 NOTE — ED Notes (Signed)
Pt on bedpan.

## 2013-07-25 ENCOUNTER — Ambulatory Visit: Payer: Medicare Other | Admitting: Neurology

## 2013-07-27 ENCOUNTER — Ambulatory Visit: Payer: Medicare Other | Admitting: Neurology

## 2013-07-28 ENCOUNTER — Ambulatory Visit: Payer: Medicare Other | Admitting: Neurology

## 2013-08-01 ENCOUNTER — Telehealth: Payer: Self-pay | Admitting: Neurology

## 2013-08-01 ENCOUNTER — Encounter (INDEPENDENT_AMBULATORY_CARE_PROVIDER_SITE_OTHER): Payer: Self-pay

## 2013-08-01 ENCOUNTER — Encounter: Payer: Self-pay | Admitting: Neurology

## 2013-08-01 ENCOUNTER — Ambulatory Visit (INDEPENDENT_AMBULATORY_CARE_PROVIDER_SITE_OTHER): Payer: Medicare Other | Admitting: Neurology

## 2013-08-01 VITALS — BP 165/85 | HR 65 | Ht 64.0 in | Wt 136.0 lb

## 2013-08-01 DIAGNOSIS — E538 Deficiency of other specified B group vitamins: Secondary | ICD-10-CM

## 2013-08-01 DIAGNOSIS — R4189 Other symptoms and signs involving cognitive functions and awareness: Secondary | ICD-10-CM

## 2013-08-01 DIAGNOSIS — F09 Unspecified mental disorder due to known physiological condition: Secondary | ICD-10-CM

## 2013-08-01 NOTE — Patient Instructions (Signed)
Overall you are doing fairly well but I do want to suggest a few things today:   Remember to drink plenty of fluid, eat healthy meals and do not skip any meals. Try to eat protein with a every meal and eat a healthy snack such as fruit or nuts in between meals. Try to keep a regular sleep-wake schedule and try to exercise daily, particularly in the form of walking, 20-30 minutes a day, if you can.   Please continue to take the Aricept 10mg  daily  I would like to repeat your B12 level today, please have this drawn after checking out  Please contact the DMV to arrange for a formal driving evaluation.   I would like to see you back in 6 months, sooner if we need to. Please call us with any interim questions, concerns, problems, updates or refill requests.   Please also call us for any test results so we can go over those with you on the phone.  My clinical assistant and will answer any of your questions and relay your messages to me and also relay most of my messages to you.   Our phone number is [phone]. We also have an after hours call service for urgent matters and there is a physician on-call for urgent questions. For any emergencies you know to call 911 or go to the nearest emergency room

## 2013-08-01 NOTE — Progress Notes (Signed)
[facility]  Provider:  Dr Janann Colonel Referring Provider: Wenda Low, MD Primary Care Physician:  Wenda Low, MD  CC: cognitive decline  HPI:  Morgan Weaver is a 80 y.o. female here as a follow up from Dr. Lysle Rubens for cognitive decline. Since last visit she was started on B12 injections and Aricept 10mg  daily. She also unfortunately fell and broke her hip and spent extensive time in rehab. Patient and son both feel she is doing markedly better since her last appointment. They have noted improvement in her overall cognitive function. She is currently taking daily oral B12. She wishes to start driving tomorrow.   Initial visit 02/2013: Patient presents today with her son who provides the majority of the history. Per the son she has had some memory trouble for the past few years, they noticed a drastic decline in the past 6-8 weeks. Most notably they noticed trouble with short term memory, increased confusion, disorientation. The son notes that recently she forgot she and owned her own home. Having trouble with basic ADLs. She does fluctuate, centered she has good moments and bad moments. They deny any hallucinations. Reports that long-term memory is still good. Denies any recent strokes TIAs seizures, no head trauma no recent infections or illnesses. Patient has a long history of alcohol use, per the center in several drinks a night. She has not been drinking in the past few weeks though. She was evaluated by her primary care physician and found for B12 of 182. Started on oral B12 for this. Had a head CT in the past few months which showed generalized atrophy but  otherwise unremarkable. Son notes that her physical activity has been decline in the past few months.  Review of Systems: Out of a complete 14 system review, the patient complains of only the following symptoms, and all other reviewed systems are negative. Positive for easy bruising memory loss confusion weakness  depression  History   Social History  . Marital Status: Married    Spouse Name: N/A    Number of Children: N/A  . Years of Education: N/A   Occupational History  . Not on file.   Social History Main Topics  . Smoking status: Former Research scientist (life sciences)  . Smokeless tobacco: Never Used     Comment: quit-1977  . Alcohol Use: 8.4 oz/week    14 Glasses of wine per week     Comment: BEFORE DINNER  . Drug Use: No  . Sexual Activity: Not on file   Other Topics Concern  . 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 Heart disease Father     heart attack  . Hypertension Father   . Other Son     brain tumor     Past Medical History  Diagnosis Date  . Torn rotator cuff   . Ovarian cyst   . Lyme disease   . Hypertension   . Hemorrhoid   . Arthritis   . Weight decrease   . Hyperlipidemia   . Thyroid disease     hypothyroidism    Past Surgical History  Procedure Laterality Date  . Appendectomy  [place] cuff repair  1989    left  . Oophorectomy  1955    removed due to the ovary having gangrene  . Abdominal hysterectomy  1970  . Knee arthroscopy    . Tonsillectomy    . Other surgical history      left foot surgery  . Other surgical history      right foot surgery  . Hip arthroplasty Left 03/16/2013    bipolar        Dr Wendee Beavers  . Hip arthroplasty Left 03/17/2013    Procedure: ARTHROPLASTY BIPOLAR HIP;  Surgeon: Augustin Schooling, MD;  Location: [facility];  Service: Orthopedics;  Laterality: Left;    Current Outpatient Prescriptions  Medication Sig Dispense Refill  . cyanocobalamin (,VITAMIN B-12,) 1000 MCG/ML injection Inject 1 mL (1,000 mcg total) into the skin once. 1065mcg injection once daily x 5 days, then once weekly  for 4 weeks, then once a month  1 mL  10  . donepezil (ARICEPT) 10 MG tablet Take 1 tablet (10 mg total) by mouth at bedtime.  30 tablet  3  . levothyroxine (SYNTHROID, LEVOTHROID) 125 MCG tablet Take 125 mcg by mouth daily before breakfast.       . acetaminophen (TYLENOL) 325 MG tablet Take 1 tablet (325 mg total) by mouth every 6 (six) hours as needed for pain.  60 tablet  0  . bisacodyl (DULCOLAX) 5 MG EC tablet Take 2 tablets (10 mg total) by mouth daily as needed.  30 tablet  0  . ciprofloxacin (CIPRO) 250 MG tablet Take 250 mg by mouth 2 (two) times daily. For 7 days beginning 03/29/13 uti      . docusate sodium (COLACE) 100 MG capsule Take 100 mg by mouth 2 (two) times daily.      Marland Kitchen enoxaparin (LOVENOX) 40 MG/0.4ML injection Inject 0.4 mLs (40 mg total) into the skin daily. 30 days post op  30 mL  0  . feeding supplement, PRO-STAT SUGAR FREE 64, (PRO-STAT SUGAR FREE 64) LIQD Take 30 mLs by mouth daily.      . iron polysaccharides (NIFEREX) 150 MG capsule Take 150 mg by mouth daily.      . predniSONE (DELTASONE) 20 MG tablet Take 2 tablets (40 mg total) by mouth daily.  8 tablet  0  . Vitamin D, Ergocalciferol, (DRISDOL) 50000 UNITS CAPS capsule Take 50,000 Units by mouth every 7 (seven) days.       No current facility-administered medications for this visit.    Allergies as of 08/01/2013 - Review Complete 08/01/2013  Allergen Reaction Noted  . Mobic [meloxicam] Other (See Comments) 11/16/2012  . Sulfa drugs cross reactors Other (See Comments) 10/17/2010  . Tetracyclines & related Swelling 10/17/2010  . Ancef [cefazolin] Rash 03/18/2013    Vitals: BP 165/85  Pulse 65  Ht 5\' 4"  (1.626 m)  Wt 136 lb (61.689 kg)  BMI 23.33 kg/m2 Last Weight:  Wt Readings from Last 1 Encounters:  08/01/13 136 lb (61.689 kg)   Last Height:   Ht Readings from Last 1 Encounters:  08/01/13 5\' 4"  (1.626 m)     Physical exam: Exam: Gen: NAD, conversant Eyes: anicteric sclerae, moist  conjunctivae HENT: Atraumati Neck: Trachea midline; supple,  Lungs: CTA, no wheezing, rales, rhonic                          CV: RRR, no MRG Abdomen: Soft, non-tender;  Extremities: No  peripheral edema  Skin: Normal temperature, no rash,  Psych: Appropriate affect, pleasant  Neuro: MS:  MOCA 20/30 (10/30 at prior visit)  CN: PERRL, impaired vertical gaze,  no nystagmus, no ptosis, frequent blinking noted, sensation intact to LT V1-V3 bilat, face symmetric, no weakness, hearing grossly intact, palate elevates symmetrically, shoulder shrug 5/5 bilat,  tongue protrudes midline, no fasiculations noted.  Motor: normal bulk and tone Strength: Moves all extremities symmetrically   Coord: rapid alternating and point-to-point (FNF, HTS) movements intact.No tremor noted at rest or with action/posture  Reflexes: symmetrical, bilat downgoing toes  Sens: LT intact in all extremities  Gait:upright, slow, slight shuffling, mild wide based, unable to tandem   Assessment:  After physical and neurologic examination, review of laboratory studies, imaging, neurophysiology testing and pre-existing records, assessment will be reviewed on the problem list.  Plan:  Treatment plan and additional workup will be reviewed under Problem List.  Ms Debruyne is a pleasant 80y/o woman who presents with her son for follow up evaluation of cognitive decline. Patient and son both note a drastic improvement in her cognitive function since last visit. Suspect the drastic worsening in her cognitive function was likely related to her decreased B12 level. Will continue patient on daily B12 supplement, continue Aricept 10mg  daily. Will recheck B12 and MMA level today. Follow up in 6 months.   1)Cognitive decline 2)B12 deficiency  -continue oral B12 supplement -recheck B12 and MMA level -continue Aricept 10mg  -follow up in 6 months

## 2013-08-01 NOTE — Telephone Encounter (Signed)
Called patient to see if they wanted to reschedule due to the weather, patient's husband will be driving her and did not want to change the appointment, stated that they will try and come in before 3 pm

## 2013-08-09 ENCOUNTER — Telehealth: Payer: Self-pay | Admitting: Neurology

## 2013-08-09 NOTE — Telephone Encounter (Signed)
Test from 08/04/2013  B12 of 893, MMA 173

## 2013-10-04 ENCOUNTER — Other Ambulatory Visit: Payer: Self-pay

## 2013-10-04 MED ORDER — DONEPEZIL HCL 10 MG PO TABS: 10.0000 mg | ORAL_TABLET | Freq: Every day | ORAL | Status: DC

## 2014-01-29 ENCOUNTER — Ambulatory Visit (INDEPENDENT_AMBULATORY_CARE_PROVIDER_SITE_OTHER): Payer: Medicare Other | Admitting: Neurology

## 2014-01-29 ENCOUNTER — Encounter: Payer: Self-pay | Admitting: Neurology

## 2014-01-29 VITALS — BP 134/68 | HR 58 | Ht 64.0 in | Wt 143.0 lb

## 2014-01-29 DIAGNOSIS — F09 Unspecified mental disorder due to known physiological condition: Secondary | ICD-10-CM

## 2014-01-29 DIAGNOSIS — R4189 Other symptoms and signs involving cognitive functions and awareness: Secondary | ICD-10-CM

## 2014-01-29 NOTE — Patient Instructions (Signed)
Overall you are doing fairly well but I do want to suggest a few things today:   Remember to drink plenty of fluid, eat healthy meals and do not skip any meals. Try to eat protein with a every meal and eat a healthy snack such as fruit or nuts in between meals. Try to keep a regular sleep-wake schedule and try to exercise daily, particularly in the form of walking, 20-30 minutes a day, if you can.   As far as your medications are concerned, I would like to suggest we do not make any changes at this time.  I would like you to have a formal driving evaluation completed. You will be contacted to schedule this. The organization is called [employer] in [place], [place].   Follow up with Cecille Rubin, NP in 6 months. Please call us with any interim questions, concerns, problems, updates or refill requests.   My clinical assistant and will answer any of your questions and relay your messages to me and also relay most of my messages to you.   Our phone number is [phone]. We also have an after hours call service for urgent matters and there is a physician on-call for urgent questions. For any emergencies you know to call 911 or go to the nearest emergency room

## 2014-01-29 NOTE — Progress Notes (Signed)
[facility]  Provider:  Dr Janann Colonel Referring Provider: Wenda Low, MD Primary Care Physician:  Leonard Downing, MD  CC: cognitive decline  HPI:  Morgan Weaver is a 80 y.o. female here as a follow up from Dr. Lysle Rubens for cognitive decline. Since last visit she feels she is doing well overall. Currently taking daily oral B12 supplementation. Continues to have difficulty mainly with short term memory but feels there has been no further decline. Taking Aricept 10mg  nightly, unsure if any improvement since starting this medication. Main concern at this visit is desire to drive again.   Initial visit 02/2013: Patient presents today with her son who provides the majority of the history. Per the son she has had some memory trouble for the past few years, they noticed a drastic decline in the past 6-8 weeks. Most notably they noticed trouble with short term memory, increased confusion, disorientation. The son notes that recently she forgot she and owned her own home. Having trouble with basic ADLs. She does fluctuate, centered she has good moments and bad moments. They deny any hallucinations. Reports that long-term memory is still good. Denies any recent strokes TIAs seizures, no head trauma no recent infections or illnesses. Patient has a long history of alcohol use, per the center in several drinks a night. She has not been drinking in the past few weeks though. She was evaluated by her primary care physician and found for B12 of 182. Started on oral B12 for this. Had a head CT in the past few months which showed generalized atrophy but  otherwise unremarkable. Son notes that her physical activity has been decline in the past few months.  Review of Systems: Out of a complete 14 system review, the patient complains of only the following symptoms, and all other reviewed systems are negative. Positive for easy bruising memory loss confusion weakness depression  History   Social  History  . Marital Status: Married    Spouse Name: N/A    Number of Children: 3  . Years of Education: 12   Occupational History  . Retired     Social History Main Topics  . Smoking status: Former Research scientist (life sciences)  . Smokeless tobacco: Never Used     Comment: quit-1977  . Alcohol Use: 8.4 oz/week    14 Glasses of wine per week     Comment: BEFORE DINNER  . Drug Use: No  . Sexual Activity: Not on file   Other Topics Concern  . 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 Heart disease Father     heart attack  . Hypertension Father   . Other Son     brain tumor     Past Medical History  Diagnosis Date  . Torn rotator cuff   . Ovarian cyst   . Lyme disease   . Hypertension   . Hemorrhoid   . Arthritis   . Weight decrease   . Hyperlipidemia   . Thyroid disease     hypothyroidism    Past Surgical History  Procedure Laterality Date  . Appendectomy  1952  . Rotator cuff repair  1989    left  . Oophorectomy  1955    removed due to the ovary having gangrene  . Abdominal hysterectomy  1970  . Knee arthroscopy    . Tonsillectomy    . Other surgical history      left foot surgery  . Other surgical history      right foot surgery  . Hip arthroplasty Left 03/16/2013    bipolar        Dr Wendee Beavers  . Hip arthroplasty Left 03/17/2013    Procedure: ARTHROPLASTY BIPOLAR HIP;  Surgeon: Augustin Schooling, MD;  Location: [facility];  Service: Orthopedics;  Laterality: Left;    Current Outpatient Prescriptions  Medication Sig Dispense Refill  . cyanocobalamin (,VITAMIN B-12,) 1000 MCG/ML injection Inject 1 mL (1,000 mcg total) into the skin once. 1039mcg injection once daily x 5 days, then once weekly for 4 weeks, then once a month  1 mL   10  . donepezil (ARICEPT) 10 MG tablet Take 1 tablet (10 mg total) by mouth at bedtime.  30 tablet  3  . feeding supplement, PRO-STAT SUGAR FREE 64, (PRO-STAT SUGAR FREE 64) LIQD Take 30 mLs by mouth daily.      Marland Kitchen levothyroxine (SYNTHROID, LEVOTHROID) 125 MCG tablet Take 125 mcg by mouth daily before breakfast.       . Vitamin D, Ergocalciferol, (DRISDOL) 50000 UNITS CAPS capsule Take 50,000 Units by mouth every 7 (seven) days.       No current facility-administered medications for this visit.    Allergies as of 01/29/2014 - Review Complete 01/29/2014  Allergen Reaction Noted  . Mobic [meloxicam] Other (See Comments) 11/16/2012  . Sulfa drugs cross reactors Other (See Comments) 10/17/2010  . Tetracyclines & related Swelling 10/17/2010  . Ancef [cefazolin] Rash 03/18/2013    Vitals: BP 134/68  Pulse 58  Ht 5\' 4"  (2.563 m)  Wt 143 lb (64.864 kg)  BMI 24.53 kg/m2 Last Weight:  Wt Readings from Last 1 Encounters:  01/29/14 143 lb (64.864 kg)   Last Height:   Ht Readings from Last 1 Encounters:  01/29/14 5\' 4"  (1.626 m)     Physical exam: Exam: Gen: NAD, conversant Eyes: anicteric sclerae, moist conjunctivae HENT: Atraumati Neck: Trachea midline; supple,  Lungs: CTA, no wheezing, rales, rhonic                          CV: RRR, no MRG Abdomen: Soft, non-tender;  Extremities: No peripheral edema  Skin: Normal temperature, no rash,  Psych: Appropriate affect, pleasant  Neuro: MS:  MOCA 18/30 (20/30 at prior visit, 10/30 at initial visit)  CN: PERRL, impaired vertical gaze,  no nystagmus, no ptosis, frequent blinking noted, sensation intact to LT V1-V3 bilat, face symmetric, no weakness, hearing grossly intact, palate elevates symmetrically, shoulder shrug 5/5 bilat,  tongue protrudes midline, no fasiculations noted.  Motor: normal bulk and tone Strength: Moves all extremities symmetrically   Coord: rapid alternating and point-to-point (FNF, HTS) movements intact.No  tremor noted at rest or with action/posture  Reflexes: symmetrical, bilat downgoing toes  Sens: LT intact in all extremities  Gait:upright, slow, slight shuffling, mild wide based, unable to tandem   Assessment:  After physical and neurologic examination, review of laboratory studies, imaging, neurophysiology testing and pre-existing records, assessment will be reviewed on the problem list.  Plan:  Treatment plan and additional workup will be reviewed under Problem List.  1)Cognitive decline 2)B12 deficiency  Ms San is  a pleasant 80y/o woman who presents with her son for follow up evaluation of cognitive decline. Overall she has had a good improvement since starting on B12 injections. Repeat level of B12 was within normal range, currently continuing on oral daily B12. MOCA shows slight decrease from 20 to 18/30 but overall appears clinically stable. Patient wishes to drive. After discussion with patient and her son we will refer Ms Katzenberger to OT for a formal driving evaluation. Continue Aricept 10mg  nightly, can consider addition of Namenda in the future. Follow up with Cecille Rubin in 6 months.

## 2014-02-22 ENCOUNTER — Other Ambulatory Visit: Payer: Self-pay

## 2014-02-22 MED ORDER — DONEPEZIL HCL 10 MG PO TABS: 10.0000 mg | ORAL_TABLET | Freq: Every day | ORAL | Status: DC

## 2014-07-23 ENCOUNTER — Telehealth: Payer: Self-pay

## 2014-07-23 NOTE — Telephone Encounter (Signed)
Spoke to son. R/s appt.

## 2014-07-30 ENCOUNTER — Ambulatory Visit: Payer: Medicare Other | Admitting: Nurse Practitioner

## 2014-08-01 ENCOUNTER — Ambulatory Visit (INDEPENDENT_AMBULATORY_CARE_PROVIDER_SITE_OTHER): Payer: Medicare Other | Admitting: Nurse Practitioner

## 2014-08-01 ENCOUNTER — Encounter: Payer: Self-pay | Admitting: Nurse Practitioner

## 2014-08-01 VITALS — BP 126/75 | HR 66 | Ht 64.0 in | Wt 138.6 lb

## 2014-08-01 DIAGNOSIS — R413 Other amnesia: Secondary | ICD-10-CM | POA: Insufficient documentation

## 2014-08-01 DIAGNOSIS — E538 Deficiency of other specified B group vitamins: Secondary | ICD-10-CM

## 2014-08-01 MED ORDER — DONEPEZIL HCL 10 MG PO TABS: 10.0000 mg | ORAL_TABLET | Freq: Every day | ORAL | Status: DC

## 2014-08-01 NOTE — Progress Notes (Signed)
[facility]  PATIENT: Morgan Weaver DOB: Jul 05, 1928   REASON FOR VISIT: follow up for memory loss, B 12 defiency HISTORY FROM: Patient and son    HISTORY OF PRESENT ILLNESS:Ms 35, 81 year old female returns for follow-up for cognitive decline. She was last seen in this office by Dr. Janann Colonel 01/29/2014 who has left our practice. After her last visit she had driving evaluation and the recommendation was that she only drive during daylight hours and only short distances in familiar places. No highway driving. She is taking daily oral B12 supplementation. Continues to have difficulty mainly with short term memory but feels there has been no further decline. Taking Aricept 10mg  nightly, she denies side effects to the medication. Her MOCA score has declined since last seen. 03/02/2013 Abnormal MRI brain (without) demonstrating: Moderate cortical and subcortical atrophy. Moderate chronic small vessel ischemic disease. Compared to prior MRI on 05/08/09, there has been some progression of atrophy and chronic small vessel ischemic disease. Explained the results to the son and to the patient. The patient does not remember being called about this nor does the son. She can still cook and perform her ADLs. One of her sons lives with her.  She returns for reevaluation. Initial visit 02/2013: Patient presents today with her son who provides the majority of the history. Per the son she has had some memory trouble for the past few years, they noticed a drastic decline in the past 6-8 weeks. Most notably they noticed trouble with short term memory, increased confusion, disorientation. The son notes that recently she forgot she and owned her own home. Having trouble with basic ADLs. She does fluctuate, centered she has good moments and bad moments. They deny any hallucinations. Reports that long-term memory is still good. Denies any recent strokes TIAs seizures, no head trauma no recent infections  or illnesses. Patient has a long history of alcohol use, per the center in several drinks a night. She has not been drinking in the past few weeks though. She was evaluated by her primary care physician and found for B12 of 182. Started on oral B12 for this. Had a head CT in the past few months which showed generalized atrophy but otherwise unremarkable. Son notes that her physical activity has been decline in the past few months.  REVIEW OF SYSTEMS: Full 14 system review of systems performed and notable only for those listed, all others are neg:  Constitutional: neg  Cardiovascular: neg Ear/Nose/Throat: neg  Skin: neg Eyes: neg Respiratory: neg Gastroitestinal: neg  Hematology/Lymphatic: Easy bruising Endocrine: neg Musculoskeletal:neg Allergy/Immunology: neg Neurological:  Memory loss Psychiatric: neg Sleep : neg   ALLERGIES: Allergies  Allergen Reactions  . Mobic [Meloxicam] Other (See Comments)    Abdominal pain, ?GI ulcer    . Sulfa Drugs Cross Reactors Other (See Comments)    per [facility]  . Tetracyclines & Related Swelling    Lips swell When asked 6/4 patient states unknown reaction Per [facility]  . Ancef [Cefazolin] Rash    Diffuse rash with urticaria, no respiratory involvement.    HOME MEDICATIONS: Outpatient Prescriptions Prior to Visit  Medication Sig Dispense Refill  . donepezil (ARICEPT) 10 MG tablet Take 1 tablet (10 mg total) by mouth at bedtime. 30 tablet 6  . levothyroxine (SYNTHROID, LEVOTHROID) 125 MCG tablet Take 125 mcg by mouth daily before breakfast.     . cyanocobalamin (,VITAMIN B-12,) 1000 MCG/ML injection Inject 1 mL (1,000 mcg total) into the skin once. 1070mcg injection  once daily x 5 days, then once weekly for 4 weeks, then once a month (Patient not taking: Reported on 08/01/2014) 1 mL 10  . feeding supplement, PRO-STAT SUGAR FREE 64, (PRO-STAT SUGAR FREE 64) LIQD Take 30 mLs by mouth daily.    . Vitamin D, Ergocalciferol, (DRISDOL) 50000 UNITS CAPS  capsule Take 50,000 Units by mouth every 7 (seven) days.     No facility-administered medications prior to visit.    PAST MEDICAL HISTORY: Past Medical History  Diagnosis Date  . Torn rotator cuff   . Ovarian cyst   . Lyme disease   . Hypertension   . Hemorrhoid   . Arthritis   . Weight decrease   . Hyperlipidemia   . Thyroid disease     hypothyroidism    PAST SURGICAL HISTORY: Past Surgical History  Procedure Laterality Date  . Appendectomy  1952  . Rotator cuff repair  1989    left  . Oophorectomy  1955    removed due to the ovary having gangrene  . Abdominal hysterectomy  1970  . Knee arthroscopy    . Tonsillectomy    . Other surgical history      left foot surgery  . Other surgical history      right foot surgery  . Hip arthroplasty Left 03/16/2013    bipolar        Dr Wendee Beavers  . Hip arthroplasty Left 03/17/2013    Procedure: ARTHROPLASTY BIPOLAR HIP;  Surgeon: Augustin Schooling, MD;  Location: [place];  Service: Orthopedics;  Laterality: Left;    FAMILY HISTORY: Family History  Problem Relation Age of Onset  . Heart disease Father     heart attack  . Hypertension Father   . Other Son     brain tumor     SOCIAL HISTORY: History   Social History  . Marital Status: Married    Spouse Name: N/A  . Number of Children: 3  . Years of Education: 12   Occupational History  . Retired     Social History Main Topics  . Smoking status: Former Research scientist (life sciences)  . Smokeless tobacco: Never Used     Comment: quit-1977  . Alcohol Use: 8.4 oz/week    14 Glasses of wine per week     Comment: BEFORE DINNER  . Drug Use: No  . Sexual Activity: Not on file   Other Topics Concern  . 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  Generalized: Well developed, in no acute distress  Head: normocephalic and atraumatic,. Oropharynx benign  Neck: Supple, no carotid bruits  Cardiac: Regular rate rhythm, no murmur  Musculoskeletal: No deformity   Neurological examination   Mentation: Alert MOCA 14/30 last 18/30. Missed all recall items. Aft 8.  Follows all commands speech and language fluent.   Cranial nerve II-XII: Pupils were equal round reactive to light extraocular movements were full, visual field were full on confrontational test. Facial sensation  and strength were normal. hearing was intact to finger rubbing bilaterally. Uvula tongue midline. head turning and shoulder shrug were normal and symmetric.Tongue protrusion into cheek strength was normal. Motor: normal bulk and tone, full strength in the BUE, BLE, fine finger movements normal, no pronator drift. No focal weakness Coordination: finger-nose-finger, heel-to-shin bilaterally, no dysmetria Reflexes: Brachioradialis 2/2, biceps 2/2, triceps 2/2, patellar 2/2, Achilles 2/2, plantar responses were flexor bilaterally. Gait and Station: Rising up from seated position without assistance,  slow wide-based gait, unable to tandem. No assistive device  DIAGNOSTIC DATA (LABS, IMAGING, TESTING) -  ASSESSMENT AND PLAN  81 y.o. year old female  has a past medical history of  Hypertension; Arthritis; Hyperlipidemia; B12 deficiency and memory loss here to follow-up.Her memory score continues to decline.   Continue Aricept at current dose Namenda starter pack given take as directed reviewed with son and patient, call for RX Continue B12 as directed daily Reviewed MRI of the brain with son and patient Follow-up in 6 months next visit with Dr. Jaynee Eagles to establish with MD in the practice Dennie Bible, [facility], [facility], [facility] [address], [place], [place] [address] [phone]

## 2014-08-01 NOTE — Patient Instructions (Addendum)
Continue Aricept at current dose Namenda starter pack given take as directed Continue B12 as directed Follow-up in 6 months next visit with Dr. Jaynee Eagles

## 2014-10-25 ENCOUNTER — Other Ambulatory Visit: Payer: Self-pay | Admitting: Family Medicine

## 2014-10-25 DIAGNOSIS — R7989 Other specified abnormal findings of blood chemistry: Secondary | ICD-10-CM

## 2014-10-26 ENCOUNTER — Telehealth: Payer: Self-pay | Admitting: Nurse Practitioner

## 2014-10-26 NOTE — Telephone Encounter (Signed)
Patients son is calling regarding the starter package of memantine (NAMENDA TITRATION PACK) tablet pack that was given to the patient but the patient did not take the medication correctly. Please call to discuss. Thank you.

## 2014-10-26 NOTE — Telephone Encounter (Signed)
I called back to clarify.  Mr Lyons said the patient was doing her meds by herself and became very confused.  She was given a Namenda titration pack at [place] on 02/17 and never completed the pack, now unable to locate it.  Says she has not taken Namenda for several weeks, and they would like to pick up another starter pack on Monday if possible.  They now have a plan in place to maintain patient's meds to hopefully avoid any further issues.

## 2014-10-26 NOTE — Telephone Encounter (Signed)
I called back to advise.  They will pick up Monday afternoon.

## 2014-10-26 NOTE — Telephone Encounter (Signed)
Left at front for pickup.

## 2014-10-29 ENCOUNTER — Ambulatory Visit
Admission: RE | Admit: 2014-10-29 | Discharge: 2014-10-29 | Disposition: A | Discharge status: Home / Self Care | Payer: Medicare Other | Source: Ambulatory Visit | Attending: Family Medicine | Admitting: Family Medicine

## 2014-10-29 DIAGNOSIS — R7989 Other specified abnormal findings of blood chemistry: Secondary | ICD-10-CM

## 2014-11-21 ENCOUNTER — Telehealth: Payer: Self-pay | Admitting: Nurse Practitioner

## 2014-11-21 NOTE — Telephone Encounter (Signed)
I called back.  Got no answer.  Left message.  

## 2014-11-21 NOTE — Telephone Encounter (Signed)
Patient's son is calling because he has questions about medications donepezil (ARICEPT) 10 MG tablet and memantine (NAMENDA TITRATION PACK) tablet pack. Patient's son states the patient has not taken donepezil (ARICEPT) 10 MG tablet for a week and a half. Please call to discuss. Thank you.

## 2014-11-22 MED ORDER — MEMANTINE HCL ER 28 MG PO CP24
28.0000 mg | ORAL_CAPSULE | Freq: Every day | ORAL | Status: DC
Start: 2014-11-22 — End: 2015-03-29

## 2014-11-22 NOTE — Telephone Encounter (Signed)
I called back.  Spoke with Morgan Weaver.  Relayed providers message.  He verbalized understanding and is agreeable to this plan.  Asked that we send a Rx for Namenda at this time, which has been sent.

## 2014-11-22 NOTE — Telephone Encounter (Signed)
Pearline Cables, pt's son called/returning Jessica's call. Please call and advise. Pearline Cables can be reached @ [phone]

## 2014-11-22 NOTE — Telephone Encounter (Signed)
I called back again.  Mr Knouff said the patient decided on her own to stop taking Aricept due to stomach upset/diarrhea.  He is unsure exactly how long she has been off of the meds, but believes it has been 1-2 weeks.  Says stomach issues have resolved.  Questioning if she should restart the med or stay off of it.  (She is almost done with Namenda starter pack.)  Please advise.  Thank you.

## 2014-11-22 NOTE — Telephone Encounter (Signed)
Stop aricept  Due to GI complaints, continue Namenda can refill after starter pack completed

## 2014-11-29 ENCOUNTER — Telehealth: Payer: Self-pay

## 2014-11-29 NOTE — Telephone Encounter (Addendum)
Patients son(Gray) called stating patient refuses to take memantine (NAMENDA XR) 28 MG CP24 24 hr capsule .Please call him ad advise. He can be reached [phone]. He requesting to speak with Dr Jaynee Eagles.

## 2014-11-29 NOTE — Telephone Encounter (Signed)
Called back to clarify.  Got no answer.  Left message.

## 2014-11-29 NOTE — Telephone Encounter (Signed)
Mr Bouza called back and said the patient did complete her Namenda Titration kit, and seemed to do well.  He picked up the maintenance dose, and says now the patient refuses to take the medication because she read the possible side effects.  She did discontinue Aricept due to GI upset.  He wishes to speak directly with CM regarding patient not wanting to comply with med regiment.  He may be reached at [phone]  Please advise.  Thank you.

## 2014-11-30 NOTE — Telephone Encounter (Signed)
TC to son. Mom read side effects of Namenda after starting maintenance dose. Now refuses to take. Diarrhea completely gone. Since Aricept discontinued. She did not have any side effects to Namenda starter pack. I would discuss it in that way with her, he is agreeable and plans to  meet with her this afternoon.

## 2015-01-30 ENCOUNTER — Ambulatory Visit: Payer: Self-pay | Admitting: Neurology

## 2015-02-06 ENCOUNTER — Ambulatory Visit: Payer: Medicare Other | Admitting: Neurology

## 2015-02-12 ENCOUNTER — Ambulatory Visit (INDEPENDENT_AMBULATORY_CARE_PROVIDER_SITE_OTHER): Payer: Medicare Other | Admitting: Neurology

## 2015-02-12 ENCOUNTER — Encounter (INDEPENDENT_AMBULATORY_CARE_PROVIDER_SITE_OTHER): Payer: Self-pay

## 2015-02-12 ENCOUNTER — Encounter: Payer: Self-pay | Admitting: Neurology

## 2015-02-12 VITALS — BP 150/76 | HR 64 | Ht 64.0 in | Wt 139.8 lb

## 2015-02-12 DIAGNOSIS — E538 Deficiency of other specified B group vitamins: Secondary | ICD-10-CM

## 2015-02-12 DIAGNOSIS — F0390 Unspecified dementia without behavioral disturbance: Secondary | ICD-10-CM | POA: Diagnosis not present

## 2015-02-12 MED ORDER — MEMANTINE HCL-DONEPEZIL HCL ER 28-10 MG PO CP24: 1.0000 | ORAL_CAPSULE | Freq: Every day | ORAL | Status: DC

## 2015-02-12 NOTE — Patient Instructions (Signed)
Remember to drink plenty of fluid, eat healthy meals and do not skip any meals. Try to eat protein with a every meal and eat a healthy snack such as fruit or nuts in between meals. Try to keep a regular sleep-wake schedule and try to exercise daily, particularly in the form of walking, 20-30 minutes a day, if you can.   As far as your medications are concerned, I would like to suggest:  will start Namzeric, discontinue Aricept and Namenda Continue B12 replacement 1037mcg daily Check B12 lab today  As far as diagnostic testing: lab  My clinical assistant and will answer any of your questions and relay your messages to me and also relay most of my messages to you.   Our phone number is [phone]. We also have an after hours call service for urgent matters and there is a physician on-call for urgent questions. For any emergencies you know to call 911 or go to the nearest emergency room

## 2015-02-12 NOTE — Progress Notes (Signed)
[facility]  PATIENT: Morgan Weaver DOB: Feb 28, 1929  REASON FOR VISIT: follow up for memory loss, B 12 defiency HISTORY FROM: Patient and son  Interval history 02/12/2015: She is a patient of a previous physician in our practice and she is transitioning to me today. Past medical history of dementia. She is here with her son who provides most information. After her last visit she had driving evaluation and the recommendation was that she only drive during daylight hours and only short distances in familiar places. No highway driving.She started taking the Aricept and threw up. She flushed the pills down the toilet. She is here with her son. Most of the time she does well. Son says she does well if he reminds her. She feels she is stable, no significant decline in her memory. She said the driving test was a rip off but she did it and laughs very pleasantly. She still drives, only during the day. No accidents. She lives with her wonderful son she says. No accidents in the home, she hasn't left the stove on for example or had any recent falls. She cooks and clean and shops. She is here with her son that doesn't live with her. No disorientation or confusion as noted in 02/2013 and they credit her improvement to B12 supplementation.She "popped back" very well until she broke her hip shortly after that and was in a nursing home. She performs all her own ADLs. She manages her own medications. She declines to take Aricept anymore even though she has been taking it for a long time successfully.   Personally reviewed MRI of the brain 03/04/2013: IMPRESSION:  Abnormal MRI brain (without) demonstrating: 1. Moderate cortical and subcortical atrophy. 2. Moderate chronic small vessel ischemic disease. 3. Compared to prior MRI on 05/08/09, there has been some progression of atrophy and chronic small vessel ischemic disease.  Reviewed previous records:  HISTORY OF PRESENT ILLNESS:Ms 12,  81 year old female returns for follow-up for cognitive decline. She was last seen in this office by Dr. Janann Weaver 01/29/2014 who has left our practice. After her last visit she had driving evaluation and the recommendation was that she only drive during daylight hours and only short distances in familiar places. No highway driving.  She is taking daily oral B12 supplementation. Continues to have difficulty mainly with short term memory but feels there has been no further decline. Taking Aricept 10mg  nightly, she denies side effects to the medication. Her MOCA score has declined since last seen. 03/02/2013 Abnormal MRI brain (without) demonstrating: Moderate cortical and subcortical atrophy. Moderate chronic small vessel ischemic disease. Compared to prior MRI on 05/08/09, there has been some progression of atrophy and chronic small vessel ischemic disease. Explained the results to the son and to the patient. The patient does not remember being called about this nor does the son. She can still cook and perform her ADLs. One of her sons lives with her. She returns for reevaluation.  Initial visit 02/2013: Patient presents today with her son who provides the majority of the history. Per the son she has had some memory trouble for the past few years, they noticed a drastic decline in the past 6-8 weeks. Most notably they noticed trouble with short term memory, increased confusion, disorientation. The son notes that recently she forgot she and owned her own home. Having trouble with basic ADLs. She does fluctuate, centered she has good moments and bad moments. They deny any hallucinations. Reports that long-term memory is still good.  Denies any recent strokes TIAs seizures, no head trauma no recent infections or illnesses. Patient has a long history of alcohol use, per the center in several drinks a night. She has not been drinking in the past few weeks though. She was evaluated by her primary care physician and found  for B12 of 182. Started on oral B12 for this. Had a head CT in the past few months which showed generalized atrophy but otherwise unremarkable. Son notes that her physical activity has been decline in the past few months.  Review of Systems: Patient complains of symptoms per HPI as well as the following symptoms: No CP, No SOB. Pertinent negatives per HPI. All others negative.   Social History   Social History  . Marital Status: Married    Spouse Name: N/A  . Number of Children: 3  . Years of Education: 12   Occupational History  . Retired     Social History Main Topics  . Smoking status: Former Research scientist (life sciences)  . Smokeless tobacco: Never Used     Comment: quit-1977  . Alcohol Use: 8.4 oz/week    14 Glasses of wine per week     Comment: BEFORE DINNER  . Drug Use: No  . Sexual Activity: Not on file   Other Topics Concern  . 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 Heart disease Father     heart attack  . Hypertension Father   . Other Son     brain tumor     Past Medical History  Diagnosis Date  . Torn rotator cuff   . Ovarian cyst   . Lyme disease   . Hypertension   . Hemorrhoid   . Arthritis   . Weight decrease   . Hyperlipidemia   . Thyroid disease     hypothyroidism    Past Surgical History  Procedure Laterality Date  . Appendectomy  1952  . Rotator cuff repair  1989    left  . Oophorectomy  1955    removed due to the ovary having gangrene  . Abdominal hysterectomy  1970  . Knee arthroscopy    . Tonsillectomy    . Other surgical history      left foot surgery  . Other surgical history      right foot surgery  . Hip arthroplasty Left 03/16/2013    bipolar        Dr Morgan Weaver  . Hip arthroplasty Left  03/17/2013    Procedure: ARTHROPLASTY BIPOLAR HIP;  Surgeon: Morgan Schooling, MD;  Location: [place];  Service: Orthopedics;  Laterality: Left;    Current Outpatient Prescriptions  Medication Sig Dispense Refill  . levothyroxine (SYNTHROID, LEVOTHROID) 125 MCG tablet Take 125 mcg by mouth daily before breakfast.     . memantine (NAMENDA XR) 28 MG CP24 24 hr capsule Take 1 capsule (28 mg total) by mouth daily. 30 capsule 3  . cyanocobalamin (,VITAMIN B-12,) 1000 MCG/ML injection Inject 1 mL (1,000 mcg total) into the skin once. 105mcg injection once daily x 5 days, then once weekly for 4 weeks, then once a month (Patient not  taking: Reported on 08/01/2014) 1 mL 10  . donepezil (ARICEPT) 10 MG tablet Take 1 tablet (10 mg total) by mouth at bedtime. (Patient not taking: Reported on 02/12/2015) 30 tablet 6  . feeding supplement, PRO-STAT SUGAR FREE 64, (PRO-STAT SUGAR FREE 64) LIQD Take 30 mLs by mouth daily.    . Vitamin D, Ergocalciferol, (DRISDOL) 50000 UNITS CAPS capsule Take 50,000 Units by mouth every 7 (seven) days.     No current facility-administered medications for this visit.    Allergies as of 02/12/2015 - Review Complete 02/12/2015  Allergen Reaction Noted  . Mobic [meloxicam] Other (See Comments) 11/16/2012  . Sulfa drugs cross reactors Other (See Comments) 10/17/2010  . Tetracyclines & related Swelling 10/17/2010  . Ancef [cefazolin] Rash 03/18/2013    Vitals: BP 150/76 mmHg  Pulse 64  Ht 5\' 4"  (1.626 m)  Wt 139 lb 12.8 oz (63.413 kg)  BMI 23.98 kg/m2 Last Weight:  Wt Readings from Last 1 Encounters:  02/12/15 139 lb 12.8 oz (63.413 kg)   Last Height:   Ht Readings from Last 1 Encounters:  02/12/15 5\' 4"  (1.626 m)   Physical exam: Exam: Gen: NAD, conversant, well nourised, well groomed                     CV: RRR, no MRG. No Carotid Bruits. No peripheral edema, warm, nontender Eyes: Conjunctivae clear without exudates or hemorrhage  Neuro: Detailed Neurologic  Exam  Speech:    Speech is normal; fluent and spontaneous with normal comprehension.  Cognition: MoCA 13/30    The patient is oriented to person, place only     recent and remote memory impaired;     language fluent;     Impaired attention, concentration,     fund of knowledge impaired Cranial Nerves:    The pupils are equal, round, and reactive to light. Visual fields are full to finger confrontation. Extraocular movements are intact. Trigeminal sensation is intact and the muscles of mastication are normal. The face is symmetric. The palate elevates in the midline. Hearing intact. Voice is normal. Shoulder shrug is normal. The tongue has normal motion without fasciculations.   Coordination:    Normal finger to nose and heel to shin. Normal rapid alternating movements.   Gait:    Heel-toe and tandem gait are normal.   Motor Observation:    No asymmetry, no atrophy, and no involuntary movements noted. Tone:    Normal muscle tone.    Posture:    Posture is normal. normal erect    Strength:    Strength is V/V in the upper and lower limbs.      Sensation: intact to LT     Reflex Exam:  DTR's:    Deep tendon reflexes in the upper and lower extremities are symmetrical bilaterally.   Toes:    The toes are downgoing bilaterally.   Clonus:    Clonus is absent.       Assessment/Plan:   Ms Denzler is a pleasant 81y/o woman who presents with her son for follow up evaluation of cognitive decline. Overall she has had a good improvement since starting on B12 injections. Repeat level of B12 was within normal range, currently continuing on oral daily B12. MOCA shows slight decrease from 20 to 18/30  And now to 13/30.   1)Cognitive decline 2)B12 deficiency  will start Namzeric, discontinue Aricept and Namenda Continue B12 replacement 1080mcg daily Check B12 lab today Call son with results Clair Gulling [phone].  Sarina Ill, MD  [facility] [address]  [place], [place] [address]  Phone [phone] Fax [phone]

## 2015-02-14 ENCOUNTER — Telehealth: Payer: Self-pay | Admitting: Neurology

## 2015-02-14 ENCOUNTER — Telehealth: Payer: Self-pay

## 2015-02-14 LAB — METHYLMALONIC ACID, SERUM: METHYLMALONIC ACID: 275 nmol/L (ref 0–378)

## 2015-02-14 LAB — B12 AND FOLATE PANEL
FOLATE: 4.8 ng/mL (ref >3.0)
Vitamin B-12: 446 pg/mL (ref 211–946)

## 2015-02-14 NOTE — Telephone Encounter (Signed)
Patients son called stating pt has been sick since starting Memantine HCl-Donepezil HCl (NAMZARIC) 28-10 MG CP24 . He is inquiring if she could go back to previous memory medication. Please call and advise. He can be reached at [phone].

## 2015-02-14 NOTE — Telephone Encounter (Signed)
Informed patients Son that patients lab work was normal, He verbalized understanding.

## 2015-02-14 NOTE — Telephone Encounter (Signed)
Called and spoke to pt son. He stated pt was sick to her stomach after taking Namzaric just like the other medication she took that made her sick. Advised that she take her temperature and make sure she does not have a fever. She c/o no other symptoms. He is wondering if he can have her go back to taking old memory medication since this did not make her sick. Advised that Dr. Jaynee Eagles is seeing pt and I speak with her and call him back. He verbalized understanding.

## 2015-02-14 NOTE — Telephone Encounter (Signed)
Spoke to her son, the one she lives with. She took the aricept, Not the namzeric. He actually doesn't even know for sure. If she got sick on the namzeric, then stop and go back to the namenda only. If she didn't try the namzeric, then please stop the namenda and aricept and try the namzeric instead. He sounded loike he had a good understanding thanks.

## 2015-02-15 DIAGNOSIS — F0390 Unspecified dementia without behavioral disturbance: Secondary | ICD-10-CM | POA: Insufficient documentation

## 2015-03-29 ENCOUNTER — Other Ambulatory Visit: Payer: Self-pay | Admitting: Nurse Practitioner

## 2015-04-01 NOTE — Telephone Encounter (Signed)
Was able to reach son today (Monday).  He verified the patient could not tolerate Namzaric, and is currently taking Namenda only.   (Please see previous note)

## 2015-09-11 ENCOUNTER — Other Ambulatory Visit: Payer: Self-pay | Admitting: Neurology

## 2015-09-11 ENCOUNTER — Telehealth: Payer: Self-pay | Admitting: Neurology

## 2015-09-11 MED ORDER — MEMANTINE HCL 10 MG PO TABS: 10.0000 mg | ORAL_TABLET | Freq: Two times a day (BID) | ORAL | Status: DC

## 2015-09-11 NOTE — Telephone Encounter (Signed)
Dr Jaynee Eagles- can you call in new rx Namenda 2x.day? I also sent you a staff message, thank you!   Called and spoke to son. Advised I talked to Dr Jaynee Eagles and we will call in Namenda 2 times daily instead. Insurance will not approve Namenda XR. He verbalized understanding.

## 2015-09-11 NOTE — Telephone Encounter (Signed)
Pt's son Veleta Miners called sts NAMENDA XR 28 MG CP24 24 hr capsule is not covered, needs PA with letter stating why the drug is necessary bc none of the other drugs on list were not helpful.  Pt still has BC/ (p) [phone] and (f) [phone].

## 2015-09-11 NOTE — Telephone Encounter (Signed)
Called son back. Advised it can normally take 3-5 business days for PA to be processed through [facility] [employer]. I will try and submit it ASAP. He states she only has 4-5 pills left to take. He apologized and states he did receive letter notifying him, but did not see it until recently. He has a lot going on. Advised I will keep him up to date in the process. He verbalized understanding.

## 2015-09-12 ENCOUNTER — Other Ambulatory Visit: Payer: Self-pay | Admitting: Neurology

## 2015-12-20 ENCOUNTER — Telehealth: Payer: Self-pay | Admitting: Neurology

## 2015-12-20 NOTE — Telephone Encounter (Signed)
Pt's son called to advise pt's dementia has gotten worse over the past month. He sts "she has accused him [place] of stealing her "bee" equipment out of a building that has lock on it(she has bee hives), an outside cat disappeared and said someone stole it-she has been told that probably a coyote got it (the vet said the same thing when she took the inside cat to the vet)-now it has morphed into "mexicans stole the cat and ate it". Pt has accused him/fiance of stealing photo album but even when she found it she was still mad, she accused him of stealing a scrapbook and then said they snuck back in and put it back, she lost a visa card but it was in her pocketbook". "She stays angry at him and his brother and gets really mad". Her house is in [place]. He sts her medication changed a mth ago( she was taking namenda 28mg  SL one x day) now 10mg  2 day. He has noticed a difference since but he is not sure she is taking the medication. He would like her to take the namenda 28mg . He sts her B12 level was down a few years ago and was "out of it when she talked" and is wanting to know if this can be checked also. He and a brother have taken the cable off her Lucianne Lei so she cannot drive. He can not talk about these things in front of her at the appt on Monday 7/10. He and a brother will coming with her. He said Dr Jaynee Eagles could call if she feels she needs to talk with him prior to the appt.  FYI

## 2015-12-23 ENCOUNTER — Ambulatory Visit (INDEPENDENT_AMBULATORY_CARE_PROVIDER_SITE_OTHER): Payer: Medicare Other | Admitting: Neurology

## 2015-12-23 ENCOUNTER — Encounter: Payer: Self-pay | Admitting: Neurology

## 2015-12-23 VITALS — BP 163/82 | HR 63 | Ht 64.0 in | Wt 130.6 lb

## 2015-12-23 DIAGNOSIS — E538 Deficiency of other specified B group vitamins: Secondary | ICD-10-CM | POA: Diagnosis not present

## 2015-12-23 MED ORDER — MEMANTINE HCL 10 MG PO TABS: 10.0000 mg | ORAL_TABLET | Freq: Two times a day (BID) | ORAL | Status: DC

## 2015-12-23 MED ORDER — RIVASTIGMINE TARTRATE 1.5 MG PO CAPS: 1.5000 mg | ORAL_CAPSULE | Freq: Two times a day (BID) | ORAL | Status: DC

## 2015-12-23 NOTE — Patient Instructions (Addendum)
Remember to drink plenty of fluid, eat healthy meals and do not skip any meals. Try to eat protein with a every meal and eat a healthy snack such as fruit or nuts in between meals. Try to keep a regular sleep-wake schedule and try to exercise daily, particularly in the form of walking, 20-30 minutes a day, if you can.   As far as your medications are concerned, I would like to suggest: Continue Namenda twice daily Add Exlelon twice daily  As far as diagnostic testing: Lab today  Recommend The 36-hour day, caring with patients with Alzheimer's dementia  I would like to see you back in 6 months, sooner if we need to. Please call us with any interim questions, concerns, problems, updates or refill requests.   Our phone number is [phone]. We also have an after hours call service for urgent matters and there is a physician on-call for urgent questions. For any emergencies you know to call 911 or go to the nearest emergency room   Rivastigmine capsules What is this medicine? RIVASTIGMINE (ri va STIG meen) is used to treat mild to moderate dementia caused by Alzheimer's disease or Parkinson's disease. This medicine may be used for other purposes; ask your health care provider or pharmacist if you have questions. What should I tell my health care provider before I take this medicine? They need to know if you have any of these conditions: -difficulty passing urine -heart disease, or irregular or slow heartbeat -kidney disease -liver disease -lung or breathing disease, like asthma -seizures -stomach or intestine disease, ulcers, or stomach bleeding -an unusual or allergic reaction to rivastigmine, other medicines, foods, dyes, or preservatives -pregnant or trying to get pregnant -breast-feeding How should I use this medicine? Take this medicine by mouth with a glass of water. Follow the directions on the prescription label. Take this medicine with food. Take your doses at regular  intervals. Do not take your medicine more often than directed. Do not stop taking except on the advice of your doctor or health care professional. Talk to your pediatrician regarding the use of this medicine in children. Special care may be needed. Overdosage: If you think you have taken too much of this medicine contact a poison control center or emergency room at once. NOTE: This medicine is only for you. Do not share this medicine with others. What if I miss a dose? If you miss a dose, take it as soon as you can. If it is almost time for your next dose, take only that dose. Do not take double or extra doses. What may interact with this medicine? -antihistamines for allergy, cough and cold -atropine -certain medicines for bladder problems like oxybutynin, tolterodine -certain medicines for Parkinson's disease like benztropine, trihexyphenidyl -certain medicines for stomach problems like dicyclomine, hyoscyamine -glycopyrrolate -ipratropium -certain medicines for travel sickness like scopolamine -medicines that relax your muscles for surgery -other medicines for Alzheimer's disease This list may not describe all possible interactions. Give your health care provider a list of all the medicines, herbs, non-prescription drugs, or dietary supplements you use. Also tell them if you smoke, drink alcohol, or use illegal drugs. Some items may interact with your medicine. What should I watch for while using this medicine? Visit your doctor or health care professional for regular checks on your progress. Check with your doctor or health care professional if your symptoms do not get better or if they get worse. You may get drowsy or dizzy. Do not drive, use machinery,  or do anything that needs mental alertness until you know how this drug affects you. What side effects may I notice from receiving this medicine? Side effects that you should report to your doctor or health care professional as soon as  possible: -allergic reactions like skin rash, itching or hives, swelling of the face, lips, or tongue -changes in vision or balance -feeling faint or lightheaded, falls -increase in frequency of passing urine, or incontinence -nervousness, agitation, or increased confusion -redness, blistering, peeling or loosening of the skin, including inside the mouth -severe diarrhea -slow heartbeat, or palpitations -stomach pain -sweating -uncontrollable movements -vomiting -weight loss Side effects that usually do not require medical attention (report to your doctor or health care professional if they continue or are bothersome): -headache -indigestion or heartburn -loss of appetite -mild diarrhea, especially when starting treatment -nausea This list may not describe all possible side effects. Call your doctor for medical advice about side effects. You may report side effects to FDA at 1-800-FDA-1088. Where should I keep my medicine? Keep out of reach of children. Store at room temperature between 15 and 30 degrees C (59 and 86 degrees F). Keep container tightly closed. Throw away any unused medicine after the expiration date. NOTE: This sheet is a summary. It may not cover all possible information. If you have questions about this medicine, talk to your doctor, pharmacist, or health care provider.    2016, Elsevier/Gold Standard. (2008-01-30 14:15:23)

## 2015-12-23 NOTE — Telephone Encounter (Signed)
Just saw her today thanks

## 2015-12-23 NOTE — Progress Notes (Signed)
[facility]    Provider:  Dr Morgan Weaver Referring Provider: Leonard Weaver, * Primary Care Physician:  Morgan Downing, MD  PATIENT: Morgan Weaver DOB: Mar 25, 1929  REASON FOR VISIT: follow up for memory loss, B 12 defiency HISTORY FROM: Patient and son  Interval history 12/23/2015: She lives with her other son. She sometimes dreams things and thinks they are real but she does not wander and no problems at night. MoCA is slightly improved today. She thinks her brother is taking things from her. Explained delusions are common in dementia such as people stealing, son denies it. She says "they are taking plenty of stuff right now". But then again I explained to son that he should monitor to ensure it is not delusions. She says they took all her old photographs but they brought them back, they have gone into her stuff and stolen things like a book that was written on growing fruit trees and other books. She had some outdoor cats and she says she saw Mexicans get her cats  And ate them. Discussed dementia and that delusions, hallucinations can happen in the later stages and this may be due to her dementia. Recommended the 36-hour day. The namzeric made her sick. She is on Namenda. Will try exelon. If she does not tolerate try patch.  Interval history 02/12/2015: She is a patient of a previous physician in our practice and she is transitioning to me today. Past medical history of dementia. She is here with her son who provides most information. After her last visit she had driving evaluation and the recommendation was that she only drive during daylight hours and only short distances in familiar places. No highway driving.She started taking the Aricept and threw up. She flushed the pills down the toilet. She is here with her son. Most of the time she does well. Son says she does well if he reminds her. She feels she is stable, no significant decline in her memory. She said the  driving test was a rip off but she did it and laughs very pleasantly. She still drives, only during the day. No accidents. She lives with her wonderful son she says. No accidents in the home, she hasn't left the stove on for example or had any recent falls. She cooks and clean and shops. She is here with her son that doesn't live with her. No disorientation or confusion as noted in 02/2013 and they credit her improvement to B12 supplementation.She "popped back" very well until she broke her hip shortly after that and was in a nursing home. She performs all her own ADLs. She manages her own medications. She declines to take Aricept anymore even though she has been taking it for a long time successfully.   Personally reviewed MRI of the brain 03/04/2013: IMPRESSION:  Abnormal MRI brain (without) demonstrating: 1. Moderate cortical and subcortical atrophy. 2. Moderate chronic small vessel ischemic disease. 3. Compared to prior MRI on 05/08/09, there has been some progression of atrophy and chronic small vessel ischemic disease.  Reviewed previous records:  HISTORY OF PRESENT ILLNESS:Ms 82, 82 year old female returns for follow-up for cognitive decline. She was last seen in this office by Dr. Janann Colonel 01/29/2014 who has left our practice. After her last visit she had driving evaluation and the recommendation was that she only drive during daylight hours and only short distances in familiar places. No highway driving.  She is taking daily oral B12 supplementation. Continues to have difficulty mainly with short term  memory but feels there has been no further decline. Taking Aricept 10mg  nightly, she denies side effects to the medication. Her MOCA score has declined since last seen. 03/02/2013 Abnormal MRI brain (without) demonstrating: Moderate cortical and subcortical atrophy. Moderate chronic small vessel ischemic disease. Compared to prior MRI on 05/08/09, there has been some progression of atrophy and  chronic small vessel ischemic disease. Explained the results to the son and to the patient. The patient does not remember being called about this nor does the son. She can still cook and perform her ADLs. One of her sons lives with her. She returns for reevaluation.  Initial visit 02/2013: Patient presents today with her son who provides the majority of the history. Per the son she has had some memory trouble for the past few years, they noticed a drastic decline in the past 6-8 weeks. Most notably they noticed trouble with short term memory, increased confusion, disorientation. The son notes that recently she forgot she and owned her own home. Having trouble with basic ADLs. She does fluctuate, centered she has good moments and bad moments. They deny any hallucinations. Reports that long-term memory is still good. Denies any recent strokes TIAs seizures, no head trauma no recent infections or illnesses. Patient has a long history of alcohol use, per the center in several drinks a night. She has not been drinking in the past few weeks though. She was evaluated by her primary care physician and found for B12 of 182. Started on oral B12 for this. Had a head CT in the past few months which showed generalized atrophy but otherwise unremarkable. Son notes that her physical activity has been decline in the past few months.  Review of Systems: Patient complains of symptoms per HPI as well as the following symptoms: No CP, No SOB. Pertinent negatives per HPI. All others negative.   Social History   Social History  . Marital Status: Married    Spouse Name: N/A  . Number of Children: 3  . Years of Education: 12   Occupational History  . Retired     Social History Main Topics  . Smoking status: Former Research scientist (life sciences)  . Smokeless tobacco: Never Used     Comment: quit-1977  . Alcohol Use: 8.4 oz/week    14 Glasses of wine per week     Comment: BEFORE DINNER  . Drug Use: No  . Sexual Activity: Not on file    Other Topics Concern  . 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 Heart disease Father     heart attack  . Hypertension Father   . Other Son     brain tumor     Past Medical History  Diagnosis Date  . Torn rotator cuff   . Ovarian cyst   . Lyme disease   . Hypertension   . Hemorrhoid   . Arthritis   . Weight decrease   . Hyperlipidemia   . Thyroid disease     hypothyroidism    Past Surgical History  Procedure Laterality Date  . Appendectomy  1952  . Rotator cuff repair  1989  left  . Oophorectomy  1955    removed due to the ovary having gangrene  . Abdominal hysterectomy  1970  . Knee arthroscopy    . Tonsillectomy    . Other surgical history      left foot surgery  . Other surgical history      right foot surgery  . Hip arthroplasty Left 03/16/2013    bipolar        Dr Wendee Beavers  . Hip arthroplasty Left 03/17/2013    Procedure: ARTHROPLASTY BIPOLAR HIP;  Surgeon: Augustin Schooling, MD;  Location: [facility];  Service: Orthopedics;  Laterality: Left;    Current Outpatient Prescriptions  Medication Sig Dispense Refill  . levothyroxine (SYNTHROID, LEVOTHROID) 125 MCG tablet Take 125 mcg by mouth daily before breakfast.     . Vitamin D, Ergocalciferol, (DRISDOL) 50000 UNITS CAPS capsule Take 50,000 Units by mouth every 7 (seven) days.    . cyanocobalamin (,VITAMIN B-12,) 1000 MCG/ML injection Inject 1 mL (1,000 mcg total) into the skin once. 1065mcg injection once daily x 5 days, then once weekly for 4 weeks, then once a month (Patient not taking: Reported on 08/01/2014) 1 mL 10  . memantine (NAMENDA) 10 MG tablet Take 1 tablet (10 mg total) by mouth 2 (two) times daily. 60 tablet 12  . rivastigmine (EXELON) 1.5 MG  capsule Take 1 capsule (1.5 mg total) by mouth 2 (two) times daily. 60 capsule 11   No current facility-administered medications for this visit.    Allergies as of 12/23/2015 - Review Complete 12/23/2015  Allergen Reaction Noted  . Mobic [meloxicam] Other (See Comments) 11/16/2012  . Sulfa drugs cross reactors Other (See Comments) 10/17/2010  . Tetracyclines & related Swelling 10/17/2010  . Ancef [cefazolin] Rash 03/18/2013    Vitals: BP 163/82 mmHg  Pulse 63  Ht 5\' 4"  (1.626 m)  Wt 130 lb 9.6 oz (59.24 kg)  BMI 22.41 kg/m2 Last Weight:  Wt Readings from Last 1 Encounters:  12/23/15 130 lb 9.6 oz (59.24 kg)   Last Height:   Ht Readings from Last 1 Encounters:  12/23/15 5\' 4"  (1.626 m)    Exam: Gen: NAD, conversant, well nourised, well groomed  CV: RRR, no MRG. No Carotid Bruits. No peripheral edema, warm, nontender Eyes: Conjunctivae clear without exudates or hemorrhage  Neuro: Detailed Neurologic Exam  Speech:  Speech is normal; fluent and spontaneous with normal comprehension.  Cognition:   The patient is oriented to person, place only   recent and remote memory impaired;   language fluent;   Impaired attention, concentration,   fund of knowledge impaired   Cranial Nerves:  The pupils are equal, round, and reactive to light. Visual fields are full to finger confrontation. Extraocular movements are intact. Trigeminal sensation is intact and the muscles of mastication are normal. The face is symmetric. The palate elevates in the midline. Hearing intact. Voice is normal. Shoulder shrug is normal. The tongue has normal motion without fasciculations.   Coordination:  Normal finger to nose and heel to shin. Normal rapid alternating movements.   Gait:  Heel-toe and tandem gait are normal.   Motor Observation:  No asymmetry, no atrophy, and no involuntary movements noted. Tone:  Normal muscle tone.   Posture:   Posture is normal. normal erect   Strength:  Strength is V/V in the upper and lower limbs.    Sensation: intact to LT   Reflex Exam:  DTR's:  Deep tendon reflexes in the upper and lower extremities  are symmetrical bilaterally.  Toes:  The toes are downgoing bilaterally.  Clonus:  Clonus is absent.      Assessment/Plan:  Ms Bozek is a pleasant 82y/o woman who presents with her son for follow up evaluation of cognitive decline. Overall she has had a good improvement since starting on B12 injections. Repeat level of B12 was within normal range, currently continuing on oral daily B12. MOCA showed decrease from 20 to 18/30 then to13/30 and now 15/30.   1)Cognitive decline 2)B12 deficiency  She did not tolerate Aricept. She is on Namenda. Will start Exelon low dose. Can increase at next appointment.  If she does not tolerate PO can try Exelon patch.  Continue B12 replacement 1077mcg daily, will check B12 today not sure she is taking it Family need to help more with medications, appears they are not Check B12 lab today Call son with results Clair Gulling [phone].      Assessment/Plan:    Sarina Ill, MD  [facility] [address] [place], [place] [address]  Phone [phone] Fax [phone]  A total of 30 minutes was spent face-to-face with this patient. Over half this time was spent on counseling patient on the dementia11 diagnosis and different diagnostic and therapeutic options available.

## 2015-12-23 NOTE — Telephone Encounter (Signed)
Dr Jaynee Eagles- This was sent to me on Friday. Not sure why it was not sent to you. Please advise. See message below

## 2015-12-24 ENCOUNTER — Telehealth: Payer: Self-pay | Admitting: Neurology

## 2015-12-24 NOTE — Telephone Encounter (Signed)
Dr Jaynee Eagles- I am not sure how you want to address this. This son is not on DPR form.

## 2015-12-24 NOTE — Telephone Encounter (Signed)
Called son, Pearline Cables back. Advised again that we cannot release any health information re patient. He is not listed on HIPPA form. He verbalized understanding. I offered to make appt with MM, NP per Dr Jaynee Eagles request. He declined at this time. He thinks he has POA and is going to look for documentation. He requested another copy of DPR be mailed so they can fill a new one out. I mailed per his request. He will drop off completed form. I did advise that Dr Jaynee Eagles and I are off next week.

## 2015-12-24 NOTE — Telephone Encounter (Signed)
I can't speak with him as he is on on the DPR form unfortunately. Have them bring in his mother for a follow up and come in together, schedule them with [facility]. thanks

## 2015-12-24 NOTE — Telephone Encounter (Signed)
Son/Gray Howren [phone] called requesting a phone call from Dr. Jaynee Eagles to discuss Mother. Advised DPR not on file to talk with him. Pearline Cables states, she accuses him of stealing everything, any time something goes missing, she blames him. States he is the one who has been paying her bills and he hasn't been to her house to take anything. States he talked with brother (brother is on [place]) regarding last office visit and brother couldn't tell him anything about the visit.

## 2015-12-25 ENCOUNTER — Telehealth: Payer: Self-pay | Admitting: Neurology

## 2015-12-25 NOTE — Telephone Encounter (Signed)
B12 is 382 which is normal. There is another test pending we will call with results thanks

## 2015-12-25 NOTE — Telephone Encounter (Signed)
Dr Ahern- FYI 

## 2015-12-25 NOTE — Telephone Encounter (Signed)
Called and spoke to Morgan Weaver and advised vitamin B12 normal. We are waiting on a pending test results still. I will call once that comes back. He verbalized understanding.

## 2015-12-25 NOTE — Telephone Encounter (Signed)
Pt's son Clair Gulling called request lab results

## 2015-12-25 NOTE — Telephone Encounter (Signed)
Dr Jaynee Eagles- Called son, Clair Gulling back. Advised labs not ready yet. We will call once ready. He verbalized understanding and stated PCP office was requesting a copy because they are going to bring her for appt to check for possible UTI. Advised Dr Jaynee Eagles checked B12 and Folate and methylmalonic acid. He said once results come back, to please fax to [phone].

## 2015-12-26 ENCOUNTER — Telehealth: Payer: Self-pay | Admitting: *Deleted

## 2015-12-26 NOTE — Telephone Encounter (Signed)
-----   Message from Melvenia Beam, MD sent at 12/26/2015 10:29 AM EDT ----- Labs normal thanks

## 2015-12-26 NOTE — Telephone Encounter (Signed)
Spoke w/ son Clair Gulling about labs. See other phone note.

## 2015-12-26 NOTE — Telephone Encounter (Signed)
Faxed Dr Cathren Laine recent office note/lab results to Dr Welton Flakes as requested. Fax: [phone]. Received confirmation.

## 2015-12-27 LAB — METHYLMALONIC ACID, SERUM: Methylmalonic Acid: 278 nmol/L (ref 0–378)

## 2015-12-27 LAB — B12 AND FOLATE PANEL
Folate: 8.8 ng/mL (ref >3.0)
VITAMIN B 12: 382 pg/mL (ref 211–946)

## 2016-01-02 ENCOUNTER — Telehealth: Payer: Self-pay | Admitting: Neurology

## 2016-01-02 NOTE — Telephone Encounter (Signed)
This is just a FYI: I called son back. Patient has had some trouble for a little over a week with hallucinations, aggressive behavior, and last week she had a syncopal episode. Prior to speaking with me, the son also called PCP. PCP suggested that patient stop the namenda and exelon for 1 week to see if there is any improvement in her behavior. Son will call us back to let us know how she is doing. If there is no improvement, son would like to have further conversation about the next steps for his mother.

## 2016-01-02 NOTE — Telephone Encounter (Signed)
Patient's son is calling stating the patient is becoming unmanageable and very agitated. The patient is imagining a lot of things that are not happening and her son is very concerned. Please call and discuss.

## 2016-01-04 NOTE — Telephone Encounter (Signed)
Terrence Dupont, if son calls back schedule an appointment for them instead. thanks

## 2016-02-12 ENCOUNTER — Ambulatory Visit: Payer: Medicare Other | Admitting: Neurology

## 2016-03-03 ENCOUNTER — Inpatient Hospital Stay (HOSPITAL_COMMUNITY)
Admission: EM | Admit: 2016-03-03 | Discharge: 2016-03-05 | DRG: 312 | Disposition: A | Discharge status: Home / Self Care | Payer: Medicare Other | Attending: Internal Medicine | Admitting: Internal Medicine

## 2016-03-03 ENCOUNTER — Emergency Department (HOSPITAL_COMMUNITY): Payer: Medicare Other

## 2016-03-03 ENCOUNTER — Encounter (HOSPITAL_COMMUNITY): Payer: Self-pay | Admitting: Emergency Medicine

## 2016-03-03 DIAGNOSIS — I959 Hypotension, unspecified: Secondary | ICD-10-CM | POA: Diagnosis not present

## 2016-03-03 DIAGNOSIS — Z881 Allergy status to other antibiotic agents status: Secondary | ICD-10-CM

## 2016-03-03 DIAGNOSIS — I10 Essential (primary) hypertension: Secondary | ICD-10-CM | POA: Diagnosis present

## 2016-03-03 DIAGNOSIS — Z8249 Family history of ischemic heart disease and other diseases of the circulatory system: Secondary | ICD-10-CM

## 2016-03-03 DIAGNOSIS — I459 Conduction disorder, unspecified: Secondary | ICD-10-CM | POA: Diagnosis not present

## 2016-03-03 DIAGNOSIS — I469 Cardiac arrest, cause unspecified: Secondary | ICD-10-CM

## 2016-03-03 DIAGNOSIS — R55 Syncope and collapse: Secondary | ICD-10-CM | POA: Diagnosis present

## 2016-03-03 DIAGNOSIS — E039 Hypothyroidism, unspecified: Secondary | ICD-10-CM | POA: Diagnosis present

## 2016-03-03 DIAGNOSIS — T441X5A Adverse effect of other parasympathomimetics [cholinergics], initial encounter: Secondary | ICD-10-CM | POA: Diagnosis present

## 2016-03-03 DIAGNOSIS — I481 Persistent atrial fibrillation: Secondary | ICD-10-CM | POA: Diagnosis present

## 2016-03-03 DIAGNOSIS — F0390 Unspecified dementia without behavioral disturbance: Secondary | ICD-10-CM

## 2016-03-03 DIAGNOSIS — Z8674 Personal history of sudden cardiac arrest: Secondary | ICD-10-CM | POA: Diagnosis not present

## 2016-03-03 DIAGNOSIS — E876 Hypokalemia: Secondary | ICD-10-CM | POA: Diagnosis present

## 2016-03-03 DIAGNOSIS — I4891 Unspecified atrial fibrillation: Secondary | ICD-10-CM

## 2016-03-03 DIAGNOSIS — N179 Acute kidney failure, unspecified: Secondary | ICD-10-CM | POA: Diagnosis present

## 2016-03-03 DIAGNOSIS — Z87891 Personal history of nicotine dependence: Secondary | ICD-10-CM | POA: Diagnosis not present

## 2016-03-03 DIAGNOSIS — E538 Deficiency of other specified B group vitamins: Secondary | ICD-10-CM | POA: Diagnosis present

## 2016-03-03 DIAGNOSIS — Z79899 Other long term (current) drug therapy: Secondary | ICD-10-CM | POA: Diagnosis not present

## 2016-03-03 DIAGNOSIS — R000 Tachycardia, unspecified: Secondary | ICD-10-CM | POA: Diagnosis present

## 2016-03-03 DIAGNOSIS — Z96642 Presence of left artificial hip joint: Secondary | ICD-10-CM | POA: Diagnosis present

## 2016-03-03 DIAGNOSIS — E785 Hyperlipidemia, unspecified: Secondary | ICD-10-CM | POA: Diagnosis present

## 2016-03-03 DIAGNOSIS — R001 Bradycardia, unspecified: Secondary | ICD-10-CM | POA: Diagnosis not present

## 2016-03-03 DIAGNOSIS — Z888 Allergy status to other drugs, medicaments and biological substances status: Secondary | ICD-10-CM | POA: Diagnosis not present

## 2016-03-03 DIAGNOSIS — J9811 Atelectasis: Secondary | ICD-10-CM | POA: Diagnosis present

## 2016-03-03 DIAGNOSIS — I495 Sick sinus syndrome: Secondary | ICD-10-CM | POA: Diagnosis not present

## 2016-03-03 DIAGNOSIS — R031 Nonspecific low blood-pressure reading: Secondary | ICD-10-CM

## 2016-03-03 DIAGNOSIS — I480 Paroxysmal atrial fibrillation: Secondary | ICD-10-CM | POA: Insufficient documentation

## 2016-03-03 DIAGNOSIS — I455 Other specified heart block: Secondary | ICD-10-CM | POA: Insufficient documentation

## 2016-03-03 LAB — CBC
HCT: 38.8 % (ref 36.0–46.0)
Hemoglobin: 12.9 g/dL (ref 12.0–15.0)
MCH: 30.7 pg (ref 26.0–34.0)
MCHC: 33.2 g/dL (ref 30.0–36.0)
MCV: 92.4 fL (ref 78.0–100.0)
PLATELETS: 150 10*3/uL (ref 150–400)
RBC: 4.2 MIL/uL (ref 3.87–5.11)
RDW: 13.2 % (ref 11.5–15.5)
WBC: 9.9 10*3/uL (ref 4.0–10.5)

## 2016-03-03 LAB — COMPREHENSIVE METABOLIC PANEL
ALT: 11 U/L — ABNORMAL LOW (ref 14–54)
ANION GAP: 12 (ref 5–15)
AST: 19 U/L (ref 15–41)
Albumin: 4.2 g/dL (ref 3.5–5.0)
Alkaline Phosphatase: 56 U/L (ref 38–126)
BILIRUBIN TOTAL: 1.6 mg/dL — AB (ref 0.3–1.2)
BUN: 12 mg/dL (ref 6–20)
CHLORIDE: 110 mmol/L (ref 101–111)
CO2: 18 mmol/L — ABNORMAL LOW (ref 22–32)
Calcium: 9.3 mg/dL (ref 8.9–10.3)
Creatinine, Ser: 1.29 mg/dL — ABNORMAL HIGH (ref 0.44–1.00)
GFR, EST AFRICAN AMERICAN: 43 mL/min — AB (ref >60)
GFR, EST NON AFRICAN AMERICAN: 37 mL/min — AB (ref >60)
Glucose, Bld: 162 mg/dL — ABNORMAL HIGH (ref 65–99)
POTASSIUM: 3.1 mmol/L — AB (ref 3.5–5.1)
Sodium: 140 mmol/L (ref 135–145)
TOTAL PROTEIN: 6.9 g/dL (ref 6.5–8.1)

## 2016-03-03 LAB — DIFFERENTIAL
BASOS ABS: 0 10*3/uL (ref 0.0–0.1)
Basophils Relative: 0 %
Eosinophils Absolute: 0 10*3/uL (ref 0.0–0.7)
Eosinophils Relative: 0 %
LYMPHS ABS: 1.5 10*3/uL (ref 0.7–4.0)
LYMPHS PCT: 15 %
Monocytes Absolute: 0.6 10*3/uL (ref 0.1–1.0)
Monocytes Relative: 6 %
NEUTROS ABS: 7.8 10*3/uL — AB (ref 1.7–7.7)
NEUTROS PCT: 78 %

## 2016-03-03 LAB — C DIFFICILE QUICK SCREEN W PCR REFLEX
C DIFFICILE (CDIFF) INTERP: NOT DETECTED
C DIFFICILE (CDIFF) TOXIN: NEGATIVE
C DIFFICLE (CDIFF) ANTIGEN: NEGATIVE

## 2016-03-03 LAB — I-STAT CHEM 8, ED
BUN: 12 mg/dL (ref 6–20)
CALCIUM ION: 1.09 mmol/L — AB (ref 1.15–1.40)
Chloride: 107 mmol/L (ref 101–111)
Creatinine, Ser: 1 mg/dL (ref 0.44–1.00)
GLUCOSE: 153 mg/dL — AB (ref 65–99)
HCT: 35 % — ABNORMAL LOW (ref 36.0–46.0)
Hemoglobin: 11.9 g/dL — ABNORMAL LOW (ref 12.0–15.0)
Potassium: 3 mmol/L — ABNORMAL LOW (ref 3.5–5.1)
SODIUM: 140 mmol/L (ref 135–145)
TCO2: 20 mmol/L (ref 0–100)

## 2016-03-03 LAB — I-STAT CG4 LACTIC ACID, ED: LACTIC ACID, VENOUS: 2.53 mmol/L — AB (ref 0.5–1.9)

## 2016-03-03 LAB — URINALYSIS, ROUTINE W REFLEX MICROSCOPIC
Bilirubin Urine: NEGATIVE
GLUCOSE, UA: 100 mg/dL — AB
Ketones, ur: NEGATIVE mg/dL
LEUKOCYTES UA: NEGATIVE
Nitrite: NEGATIVE
Protein, ur: NEGATIVE mg/dL
SPECIFIC GRAVITY, URINE: 1.008 (ref 1.005–1.030)
pH: 6.5 (ref 5.0–8.0)

## 2016-03-03 LAB — LACTIC ACID, PLASMA: Lactic Acid, Venous: 2.2 mmol/L (ref 0.5–1.9)

## 2016-03-03 LAB — I-STAT TROPONIN, ED: Troponin i, poc: 0.01 ng/mL (ref 0.00–0.08)

## 2016-03-03 LAB — URINE MICROSCOPIC-ADD ON: SQUAMOUS EPITHELIAL / LPF: NONE SEEN

## 2016-03-03 LAB — LIPASE, BLOOD: LIPASE: 24 U/L (ref 11–51)

## 2016-03-03 LAB — POC OCCULT BLOOD, ED: Fecal Occult Bld: NEGATIVE

## 2016-03-03 LAB — ETHANOL: Alcohol, Ethyl (B): <5 mg/dL (ref <5)

## 2016-03-03 LAB — TSH: TSH: 0.128 u[IU]/mL — ABNORMAL LOW (ref 0.350–4.500)

## 2016-03-03 MED ORDER — LEVOTHYROXINE SODIUM 100 MCG IV SOLR
62.5000 ug | Freq: Every day | INTRAVENOUS | Status: DC
  Administered 2016-03-04: 62.5 ug via INTRAVENOUS
  Filled 2016-03-03: qty 5

## 2016-03-03 MED ORDER — SODIUM CHLORIDE 0.9 % IV BOLUS (SEPSIS)
500.0000 mL | Freq: Once | INTRAVENOUS | Status: DC
Start: 2016-03-03 — End: 2016-03-03

## 2016-03-03 MED ORDER — MEMANTINE HCL 10 MG PO TABS
10.0000 mg | ORAL_TABLET | Freq: Two times a day (BID) | ORAL | Status: DC
  Administered 2016-03-04 – 2016-03-05 (×3): 10 mg via ORAL
  Filled 2016-03-03 (×3): qty 1

## 2016-03-03 MED ORDER — MIDAZOLAM HCL 2 MG/2ML IJ SOLN
INTRAMUSCULAR | Status: AC
  Filled 2016-03-03: qty 2

## 2016-03-03 MED ORDER — SODIUM CHLORIDE 0.9 % IV BOLUS (SEPSIS)
1000.0000 mL | Freq: Once | INTRAVENOUS | Status: AC
Start: 2016-03-03 — End: 2016-03-03
  Administered 2016-03-03: 1000 mL via INTRAVENOUS

## 2016-03-03 MED ORDER — HEPARIN SODIUM (PORCINE) 5000 UNIT/ML IJ SOLN
5000.0000 [IU] | Freq: Three times a day (TID) | INTRAMUSCULAR | Status: DC
  Administered 2016-03-03 – 2016-03-05 (×5): 5000 [IU] via SUBCUTANEOUS
  Filled 2016-03-03 (×6): qty 1

## 2016-03-03 MED ORDER — ONDANSETRON HCL 4 MG/2ML IJ SOLN
4.0000 mg | Freq: Once | INTRAMUSCULAR | Status: AC
  Administered 2016-03-03: 4 mg via INTRAVENOUS
  Filled 2016-03-03: qty 2

## 2016-03-03 MED ORDER — MIDAZOLAM HCL 2 MG/2ML IJ SOLN: 2.0000 mg | Freq: Once | INTRAMUSCULAR | Status: DC

## 2016-03-03 MED ORDER — SODIUM CHLORIDE 0.9 % IV SOLN: 250.0000 mL | INTRAVENOUS | Status: DC | PRN

## 2016-03-03 MED ORDER — SODIUM CHLORIDE 0.9 % IV SOLN
INTRAVENOUS | Status: DC
  Administered 2016-03-03: 21:00:00 via INTRAVENOUS

## 2016-03-03 NOTE — ED Notes (Signed)
Dr Vanita Panda at bedside.  Bair hugger placed on pt.

## 2016-03-03 NOTE — ED Notes (Signed)
At approximately 1344 patient noted to have asystole event, event lasting about 15-20 seconds with return of rate to 140, atrial fibrillation. MD Vanita Panda aware. Pt awake at this time, reports she does not feel well.

## 2016-03-03 NOTE — ED Notes (Signed)
Pt had second episode of systole with urinary incontinence.  Dr Vanita Panda notified.  Gave verbal order for 2 mg versed and to place pt on pace mode on zoll if pt has another episode.

## 2016-03-03 NOTE — ED Provider Notes (Signed)
With new onset syncope with arrhythmia including A. fib with RVR and 15 second sinus pauses. Poor candidate for pacemaker, and cardiology felt this may be consistent with Exelon. Family medicine has refused to admit due to potential instability of patient with her arrhythmia. Spoke with ICU who will see and admit.   Forde Dandy, MD 03/03/16 [phone]

## 2016-03-03 NOTE — ED Triage Notes (Addendum)
Pt from home with sudden onset N/V/D starting around 11 am.  Pt had syncopal episode with shaking during EMS arrival.  Family reports pt may have taken today and yesterdays medications today but is unsure.  Pt cold to touch.  Emesis causes HR to drop to the 20s.  Skin tear present to left elbow.  EKG shows bradycardia but unremarkable.  Pt reports only feeling nauseated and burning in her nose and throat from throwing up.  Given 4 mg Zofran and 400 mL NS.  Pt A&Ox4.

## 2016-03-03 NOTE — ED Notes (Signed)
Family at bedside. 

## 2016-03-03 NOTE — ED Notes (Signed)
Pt was SB prior to asystole event and returned in a-fib with no documented hx of the same.  Notified Dr Vanita Panda.

## 2016-03-03 NOTE — Consult Note (Addendum)
ELECTROPHYSIOLOGY CONSULT NOTE    Patient ID: Morgan Weaver MRN: [id number], DOB/AGE: 1929/06/09 82 y.o.  Admit date: 03/03/2016 Date of Consult: 03/03/2016  Primary Physician: Leonard Downing, MD Primary Cardiologist: new to [facility]  Reason for Consultation: AF with RVR and asystole   HPI:  Morgan Weaver is a 82 y.o. female with a past medical history significant for hypertension, hyperlipidemia, dementia, and hypothyroidism.  History is primarily obtained from 2 sons at bedside as patient is lethargic and not very responsive. She has been followed by [facility] for memory issues and has had her medications recently adjusted.  She had an episode of syncope about 4 weeks ago when she was standing in her kitchen. She had just been started on Exelon and took her first dose that day. She had no warning that she can remember. EMS was called and vitals were normal at that time. She was not evaluated in the hospital then. Her sons called her physician who advised to not take Exelon anymore. This was put back into her pill box last night and her sons think she took 2 doses of Exelon as well as her thyroid medication this am.  Today, she developed nausea, vomiting, diarrhea, and recurrent syncope.  EMS was called and while she was sitting in the chair, she had syncope with asystole. She had ROSC without intervention.  In the ER, she has been in AF with RVR as well as periods of asystole not associated with nausea or vomiting.  EP has been asked to evaluate for treatment options.  Lab work is notable for lactate of 2, K 3, Creat 1.    Last echo 2014 was largely unremarkable.  She lives with her youngest son at home and is not very active at baseline. She states that she has occasional chest pain but is unable to elaborate. She says her breathing is ok.   Past Medical History:  Diagnosis Date  . Arthritis   . Hemorrhoid   . Hyperlipidemia   . Hypertension   . Lyme disease   . Ovarian  cyst   . Thyroid disease    hypothyroidism  . Torn rotator cuff   . Weight decrease      Surgical History:  Past Surgical History:  Procedure Laterality Date  . ABDOMINAL HYSTERECTOMY  1970  . APPENDECTOMY  1952  . HIP ARTHROPLASTY Left 03/16/2013   bipolar        Dr Wendee Beavers  . HIP ARTHROPLASTY Left 03/17/2013   Procedure: ARTHROPLASTY BIPOLAR HIP;  Surgeon: Augustin Schooling, MD;  Location: [place];  Service: Orthopedics;  Laterality: Left;  . KNEE ARTHROSCOPY    . OOPHORECTOMY  1955   removed due to the ovary having gangrene  . OTHER SURGICAL HISTORY     left foot surgery  . OTHER SURGICAL HISTORY     right foot surgery  . [place]   left  . TONSILLECTOMY        (Not in a hospital admission)  Inpatient Medications:  . midazolam  2 mg Intravenous Once    Allergies:  Allergies  Allergen Reactions  . Mobic [Meloxicam] Other (See Comments)    Abdominal pain, ?GI ulcer    . Sulfa Drugs Cross Reactors Other (See Comments)    per [facility]  . Tetracyclines & Related Swelling    Lips swell When asked 6/4 patient states unknown reaction Per [facility]  . Ancef [Cefazolin] Rash    Diffuse  rash with urticaria, no respiratory involvement.    Social History   Social History  . Marital status: Married    Spouse name: N/A  . Number of children: 3  . Years of education: 12   Occupational History  . Retired     Social History Main Topics  . Smoking status: Former Research scientist (life sciences)  . Smokeless tobacco: Never Used     Comment: quit-1977  . Alcohol use 8.4 oz/week    14 Glasses of wine per week     Comment: BEFORE DINNER  . Drug use: No  . Sexual activity: Not on file   Other Topics Concern  . 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 Heart disease Father     heart attack  . Hypertension Father   . Other Son     brain tumor      Review of Systems: All other systems reviewed and are otherwise negative except as noted above.  Physical Exam: Vitals:   03/03/16 1330 03/03/16 1345 03/03/16 1347 03/03/16 1400  BP: 163/95 120/72 183/84 144/71  Pulse: 62  109 (!) 43  Resp: 12 18 21 13   Temp:      TempSrc:      SpO2: 99%  (!) 87%     GEN- The patient is elderly and ill appearing, lethargic but arouses HEENT: normocephalic, atraumatic; sclera clear, conjunctiva pink; hearing intact; oropharynx clear; neck supple  Lungs- Clear to ausculation bilaterally, normal work of breathing.  No wheezes, rales, rhonchi Heart- Irregular rate and rhythm  GI- soft, non-tender, non-distended, bowel sounds present  Extremities- no clubbing, cyanosis, or edema  MS- age appropriate atrophy Skin- warm and dry, no rash or lesion Psych- sleeping but rouses, interactive but confused, + advanced dementia Neuro- strength and sensation are intact  Labs:   Lab Results  Component Value Date   WBC 7.5 03/20/2013   HGB 9.5 (L) 03/20/2013   HCT 27.6 (L) 03/20/2013   MCV 89.0 03/20/2013   PLT 128 (L) 03/20/2013   No results for input(s): NA, K, CL, CO2, BUN, CREATININE, CALCIUM, PROT, BILITOT, ALKPHOS, ALT, AST, GLUCOSE in the last 168 hours.  Invalid input(s): LABALBU    Radiology/Studies: Dg Chest Portable 1 View Result Date: 03/03/2016 CLINICAL DATA:  Syncope. EXAM: PORTABLE CHEST 1 VIEW COMPARISON:  11/17/2012 . FINDINGS: Mediastinum and hilar structures normal. Heart size normal. Low lung volumes . Mild bibasilar subsegmental atelectasis. No pleural effusion pneumothorax. IMPRESSION: Low lung volumes with mild bibasilar subsegmental atelectasis. Electronically Signed   By: Marcello Moores  Register   On: 03/03/2016 13:27    EKG: atrial fibrillation   TELEMETRY: atrial fibrillation with overall controlled V  rate, some RVR, periods of asystole with R-R lenghtening  Assessment/Plan: 1.  New onset atrial fibrillation with RVR and pauses of up to 15 seconds. The patient has newly diagnosed atrial fibrillation in the setting of taking 2 doses of Exelon. She also has had periods of asystole up to 15 seconds with syncope. This is the same symptom she had after taking a dose of Exelon a month ago at which time her  sons stopped putting the med in her pill box.  For now, would hold Exelon and follow.  Her sons would prefer a more conservative approach if possible Update echo, TSH CHADS2VASC is at least 4 - will decide need for long term anticoagulation depending on length of this AF episode.  Would allow some degree of tachcyardia if patient is asymptomatic in light of pauses today.   2.  HTN Stable No change required today  3.  Dementia Per primary team   4.  Hypotension Per primary team  5.  Nausea/vomiting/diarrhea ?side effect from [employer] for now   Signed, Chanetta Marshall, NP 03/03/2016 2:31 PM  I have seen, examined the patient, and reviewed the above assessment and plan.  Changes to above are made where necessary.  On exam, iRRR.  She has advanced dementia.  History is most consistent with exelon related syncope/ bradycardia.  These are known reactions from this medicine. Given advanced age, a conservative approach is best.  Would allow exelon to wash out.  Hopefully we can avoid pacing.  Currently, she is a poor candidate for anticoagulation.   EP to follow. She is quite ill and at risk for decompensation/ additional arrhythmias a death.  A high level of decision making was required for this ED consultation.    Co Sign: Thompson Grayer, MD 03/03/2016 4:32 PM

## 2016-03-03 NOTE — ED Notes (Signed)
Dr Vanita Panda at bedside speaking with pts son.

## 2016-03-03 NOTE — ED Notes (Signed)
This EMT was in with patient obtaining 12 leak EKG, at this time patient states that she feel like she needs to vomit. Turned patient on side and patient vomited roughly 3 times. Patient soon became unresponsive and was not breathing, EMT checked for carotid pulse for roughly 10 seconds and alerted nurses. Monitor showed asystole at this time. Within another 15 seconds patient becoming more responsive and has a strong carotid pulse in the 140's. Notified Dr. Vanita Panda of event

## 2016-03-03 NOTE — H&P (Signed)
PULMONARY / CRITICAL CARE MEDICINE   Name: Morgan Weaver MRN: [id number] DOB: [birth year]    ADMISSION DATE:  03/03/2016 CONSULTATION DATE:  03/03/16  REFERRING MD:  Oleta Mouse - EDP  CHIEF COMPLAINT:  Syncope  HISTORY OF PRESENT ILLNESS:  Morgan Weaver is a 82 y.o. female with PMH as outlined below. She was brought to [facility] ED 9/19 with syncope.  She apparently had an episode of N/V around 11AM followed by syncopal episode.  She apparently had similar episode 4 weeks ago while standing in her kitchen.  She had been started on Exelon by neurology that same day and this episode occurred after she took her dose for that day.  Her neuro physician was called and pt was informed not to take the Exelon any more. On morning of 9/19, the exelon was mistakenly put into her daily pill box and she took it along with the remainder of her daily pills.  On EMS arrival to her home, she had another syncopal episode followed by 15-20 seconds of sinus pause / asystole.  In ER, she had AF with RVR as well as episodes of sinus pauses.  Due to her sinus pauses, PCCM was asked to admit to ICU.  Dr. Nelda Marseille had extensive discussion with pt's two sons.  Given her age and history, decision was made to list her as [place] (no CPR, no ACLS medications, no intubation, defibrillation OK).  PAST MEDICAL HISTORY :  She  has a past medical history of Arthritis; Hemorrhoid; Hyperlipidemia; Hypertension; Lyme disease; Ovarian cyst; Thyroid disease; Torn rotator cuff; and Weight decrease.  PAST SURGICAL HISTORY: She  has a past surgical history that includes Appendectomy (1952); Rotator cuff repair (1989); Oophorectomy (1955); Abdominal hysterectomy (1970); Knee arthroscopy; Tonsillectomy; Other surgical history; Other surgical history; Hip Arthroplasty (Left, 03/16/2013); and Hip Arthroplasty (Left, 03/17/2013).  Allergies  Allergen Reactions  . Aricept [Donepezil Hcl] Nausea And Vomiting  . Mobic [Meloxicam] Other (See  Comments)    Abdominal pain, ?GI ulcer    . Namenda [Memantine Hcl] Nausea And Vomiting  . Tetracyclines & Related Swelling    Lips swell   . Ancef [Cefazolin] Rash    Diffuse rash with urticaria, no respiratory involvement.  . Sulfa Drugs Cross Reactors Other (See Comments)    per [facility]    No current facility-administered medications on file prior to encounter.    Current Outpatient Prescriptions on File Prior to Encounter  Medication Sig  . levothyroxine (SYNTHROID, LEVOTHROID) 125 MCG tablet Take 125 mcg by mouth daily before breakfast.   . rivastigmine (EXELON) 1.5 MG capsule Take 1 capsule (1.5 mg total) by mouth 2 (two) times daily.  . cyanocobalamin (,VITAMIN B-12,) 1000 MCG/ML injection Inject 1 mL (1,000 mcg total) into the skin once. 1054mcg injection once daily x 5 days, then once weekly for 4 weeks, then once a month (Patient not taking: Reported on 03/03/2016)  . memantine (NAMENDA) 10 MG tablet Take 1 tablet (10 mg total) by mouth 2 (two) times daily. (Patient not taking: Reported on 03/03/2016)    FAMILY HISTORY:  Her indicated that her mother is deceased. She indicated that her father is deceased. She indicated that the status of her son is unknown.    SOCIAL HISTORY: She  reports that she has quit smoking. She has never used smokeless tobacco. She reports that she drinks about 8.4 oz of alcohol per week . She reports that she does not use drugs.  REVIEW OF SYSTEMS:  All negative; except for those that are bolded, which indicate positives.  Constitutional: weight loss, weight gain, night sweats, fevers, chills, fatigue, weakness.  HEENT: headaches, sore throat, sneezing, nasal congestion, post nasal drip, difficulty swallowing, tooth/dental problems, visual complaints, visual changes, ear aches. Neuro: difficulty with speech, weakness, numbness, ataxia. CV:  chest pain, orthopnea, PND, swelling in lower extremities, dizziness, palpitations, syncope.  Resp: cough,  hemoptysis, dyspnea, wheezing. GI: heartburn, indigestion, abdominal pain, nausea, vomiting, diarrhea, constipation, change in bowel habits, loss of appetite, hematemesis, melena, hematochezia.  GU: dysuria, change in color of urine, urgency or frequency, flank pain, hematuria. MSK: joint pain or swelling, decreased range of motion. Psych: change in mood or affect, depression, anxiety, suicidal ideations, homicidal ideations. Skin: rash, itching, bruising.  SUBJECTIVE:  Denies lightheadedness, chest pain, SOB.  VITAL SIGNS: BP 112/59   Pulse 63   Temp 98.1 F (36.7 C) (Rectal)   Resp 10   SpO2 94%   HEMODYNAMICS:    VENTILATOR SETTINGS:    INTAKE / OUTPUT: I/O last 3 completed shifts: In: 1000 [IV Piggyback:1000] Out: 150 [Urine:150]   PHYSICAL EXAMINATION: General: Elderly female, frail, in NAD. Neuro: Awake and alert.  Demented. HEENT: [place]/AT. PERRL, sclerae anicteric. Cardiovascular: IRIR, no M/R/G.  Lungs: Respirations even and unlabored.  CTA bilaterally, No W/R/R. Abdomen: BS x 4, soft, NT/ND.  Musculoskeletal: No gross deformities, no edema.  Skin: Intact, warm, no rashes.  LABS:  BMET  Recent Labs Lab 03/03/16 1434 03/03/16 1528  NA 140 140  K 3.0* 3.1*  CL 107 110  CO2  --  18*  BUN 12 12  CREATININE 1.00 1.29*  GLUCOSE 153* 162*    Electrolytes  Recent Labs Lab 03/03/16 1528  CALCIUM 9.3    CBC  Recent Labs Lab 03/03/16 1434 03/03/16 1528  WBC  --  9.9  HGB 11.9* 12.9  HCT 35.0* 38.8  PLT  --  150    Coag's No results for input(s): APTT, INR in the last 168 hours.  Sepsis Markers  Recent Labs Lab 03/03/16 1310  LATICACIDVEN 2.53*    ABG No results for input(s): PHART, PCO2ART, PO2ART in the last 168 hours.  Liver Enzymes  Recent Labs Lab 03/03/16 1528  AST 19  ALT 11*  ALKPHOS 56  BILITOT 1.6*  ALBUMIN 4.2    Cardiac Enzymes No results for input(s): TROPONINI, PROBNP in the last 168 hours.  Glucose No  results for input(s): GLUCAP in the last 168 hours.  Imaging Dg Chest Portable 1 View  Result Date: 03/03/2016 CLINICAL DATA:  Syncope. EXAM: PORTABLE CHEST 1 VIEW COMPARISON:  11/17/2012 . FINDINGS: Mediastinum and hilar structures normal. Heart size normal. Low lung volumes . Mild bibasilar subsegmental atelectasis. No pleural effusion pneumothorax. IMPRESSION: Low lung volumes with mild bibasilar subsegmental atelectasis. Electronically Signed   By: Marcello Moores  Register   On: 03/03/2016 13:27     STUDIES:  CXR 9/19 > low volumes.  CULTURES: None.  ANTIBIOTICS: None.  SIGNIFICANT EVENTS: 9/19 > admit.  LINES/TUBES: None.  DISCUSSION: 82 y.o. female admitted 9/19 with syncope due to sinus pauses.  Cardiology feels this is due to medication effect (Exelon).  Recommending d/c Exelon and observing with hopes to avoid pacing.  PCCM asked to admit to ICU.  Note, LIMITED CODE STATUS (no CPR, no ACLS medications, no intubation, defibrillation OK).  ASSESSMENT / PLAN:  CARDIOVASCULAR A:  AF with RVR - currently rate controlled. Sinus pauses - cardiology feels due to medication effect (Exelon). Hx  HTN, HLD. P:  Defer anticoagulation decision to cardiology. Cardiology following, management per them.  PULMONARY A: Atelectasis. P:   Incentive spirometry.  RENAL A:   Hypokalemia. AKI. P:   NS @ 75. Correct electrolytes as indicated. BMP in AM.  GASTROINTESTINAL A:   Nutrition. P:   Heart healthy diet.  HEMATOLOGIC A:   VTE Prophylaxis. P:  SCD's / Heparin. CBC in AM.  INFECTIOUS A:   No indication of infection. P:   Monitor clinically.  ENDOCRINE A:   Hypothyroidism. P:   Continue preadmission synthroid, change to IV formulation. Assess TSH.  NEUROLOGIC A:   Dementia. P:   Continue preadmission memantine. Hold preadmission exelon, would d/c permanently given cardiac adverse effects.  Family updated: Extensive discussion with pt's two sons and Dr.  Nelda Marseille.  Given her age and history, decision was made to list her as [place] (no CPR, no ACLS medications, no intubation, defibrillation OK).  Interdisciplinary Family Meeting v Palliative Care Meeting:  Due by: 9/26.  CC time: 30 minutes.  Montey Hora, [facility] Pager: [phone]  or [phone] 03/03/2016, 8:52 PM  Attending Note:  82 year old female with extensive PMH who presents to the hospital with sinus pauses.  EP felt is related to exelon that the patient likely took an extra dose of.  On exam, patient is alert and interactive, feels much better, lungs are clear to auscultation.  On external pacer is case needed.  I reviewed CXR myself, low volume and some atelectasis.  I spoke with the sons extensively.  After discussion, patient would never want to be on life support.  Would never want life support and CPR but ok with cardioversion given initial presentation.  Will admit to the ICU for observation overnight, can likely transfer out of the ICU in AM if remains stable overnight.  Will change code status as above.  The patient is critically ill with multiple organ systems failure and requires high complexity decision making for assessment and support, frequent evaluation and titration of therapies, application of advanced monitoring technologies and extensive interpretation of multiple databases.   Critical Care Time devoted to patient care services described in this note is  45  Minutes. This time reflects time of care of this signee Dr Jennet Maduro. This critical care time does not reflect procedure time, or teaching time or supervisory time of PA/NP/Med student/Med Resident etc but could involve care discussion time.  Rush Farmer, M.D. [facility] Pulmonary/Critical Care Medicine. Pager: [phone]. After hours pager: [phone].

## 2016-03-03 NOTE — ED Provider Notes (Addendum)
[place] DEPT Provider Note   CSN: [id number] Arrival date & time: 03/03/16  1209     History   Chief Complaint Chief Complaint  Patient presents with  . Emesis  . Diarrhea    HPI Morgan Weaver is a 82 y.o. female.  HPI  Patient presents with concern of nausea, vomiting, diarrhea. Patient states that symptoms began suddenly, just prior to ED arrival. She states that she was well prior to the event. She specifically denies chest or abdominal pain, acknowledges substantial weakness to go with the anorexia, vomiting. She states that she was well prior to this. Family members related to EMS providers their concern about the patient possibly taking multiple doses of medicine, but patient herself denies this. Patient does seemingly have some history of memory loss, complicating the history of present illness. Level V caveat     Past Medical History:  Diagnosis Date  . Arthritis   . Hemorrhoid   . Hyperlipidemia   . Hypertension   . Lyme disease   . Ovarian cyst   . Thyroid disease    hypothyroidism  . Torn rotator cuff   . Weight decrease     Patient Active Problem List   Diagnosis Date Noted  . Dementia 02/15/2015  . Memory loss 08/01/2014  . B12 deficiency 08/01/2014  . Hyponatremia 03/18/2013  . Closed left hip fracture ([place]) 03/16/2013  . Thrombocytopenia, unspecified ([place]) 11/17/2012  . Leukopenia 11/17/2012  . Syncope 11/16/2012  . Acute UTI 11/16/2012  . Hypertension 11/16/2012  . Hypothyroid 11/16/2012  . Rash 11/16/2012  . Pruritus ani 04/16/2011    Past Surgical History:  Procedure Laterality Date  . ABDOMINAL HYSTERECTOMY  1970  . APPENDECTOMY  1952  . HIP ARTHROPLASTY Left 03/16/2013   bipolar        Dr Wendee Beavers  . HIP ARTHROPLASTY Left 03/17/2013   Procedure: ARTHROPLASTY BIPOLAR HIP;  Surgeon: Augustin Schooling, MD;  Location: [facility];  Service: Orthopedics;  Laterality: Left;  . KNEE ARTHROSCOPY    . OOPHORECTOMY  1955   removed due to  the ovary having gangrene  . OTHER SURGICAL HISTORY     left foot surgery  . OTHER SURGICAL HISTORY     right foot surgery  . [place]   left  . TONSILLECTOMY      OB History    No data available       Home Medications    Prior to Admission medications   Medication Sig Start Date End Date Taking? Authorizing Provider  cyanocobalamin (,VITAMIN B-12,) 1000 MCG/ML injection Inject 1 mL (1,000 mcg total) into the skin once. 1025mcg injection once daily x 5 days, then once weekly for 4 weeks, then once a month Patient not taking: Reported on 08/01/2014 02/23/13   Drema Dallas, DO  levothyroxine (SYNTHROID, LEVOTHROID) 125 MCG tablet Take 125 mcg by mouth daily before breakfast.     Historical Provider, MD  memantine (NAMENDA) 10 MG tablet Take 1 tablet (10 mg total) by mouth 2 (two) times daily. 12/23/15   Melvenia Beam, MD  rivastigmine (EXELON) 1.5 MG capsule Take 1 capsule (1.5 mg total) by mouth 2 (two) times daily. 12/23/15   Melvenia Beam, MD  Vitamin D, Ergocalciferol, (DRISDOL) 50000 UNITS CAPS capsule Take 50,000 Units by mouth every 7 (seven) days.    Historical Provider, MD    Family History Family History  Problem Relation Age of Onset  . Heart disease Father  heart attack  . Hypertension Father   . Other Son     brain tumor     Social History Social History  Substance Use Topics  . Smoking status: Former Research scientist (life sciences)  . Smokeless tobacco: Never Used     Comment: quit-1977  . Alcohol use 8.4 oz/week    14 Glasses of wine per week     Comment: BEFORE DINNER     Allergies   Mobic [meloxicam]; Sulfa drugs cross reactors; Tetracyclines & related; and Ancef [cefazolin]   Review of Systems Review of Systems  Unable to perform ROS: Dementia     Physical Exam Updated Vital Signs BP (!) 216/105   Pulse (!) 43   Temp 98.5 F (36.9 C) (Rectal)   Resp 15   SpO2 94%   Physical Exam  Constitutional: She has a sickly appearance. She  appears distressed.  HENT:  Head: Normocephalic and atraumatic.  Eyes: Conjunctivae and EOM are normal.  Cardiovascular: Normal rate and regular rhythm.   Pulmonary/Chest: Effort normal and breath sounds normal. No stridor. No respiratory distress.  Abdominal: She exhibits no distension. There is no tenderness.    Genitourinary:     Musculoskeletal: She exhibits no edema.  Neurological: She is alert. No cranial nerve deficit.  Skin: Skin is warm and dry.     Psychiatric: Her mood appears anxious. Cognition and memory are impaired.  Nursing note and vitals reviewed.    ED Treatments / Results  Labs (all labs ordered are listed, but only abnormal results are displayed) Labs Reviewed  I-STAT CG4 LACTIC ACID, ED - Abnormal; Notable for the following:       Result Value   Lactic Acid, Venous 2.53 (*)    All other components within normal limits  I-STAT CHEM 8, ED - Abnormal; Notable for the following:    Potassium 3.0 (*)    Glucose, Bld 153 (*)    Calcium, Ion 1.09 (*)    Hemoglobin 11.9 (*)    HCT 35.0 (*)    All other components within normal limits  C DIFFICILE QUICK SCREEN W PCR REFLEX  COMPREHENSIVE METABOLIC PANEL  ETHANOL  LIPASE, BLOOD  CBC WITH DIFFERENTIAL/PLATELET  URINALYSIS, ROUTINE W REFLEX MICROSCOPIC (NOT AT [facility])  POC OCCULT BLOOD, ED  I-STAT TROPOININ, ED    On initial exam the patient has sinus rhythm, bradycardic, rate 40/50, abnormal  EKG this EKG is from following the patient's episode of pulselessness, apnea  EKG Interpretation  Date/Time:  Tuesday March 03 2016 12:59:09 EDT Ventricular Rate:  66 PR Interval:    QRS Duration: 103 QT Interval:  397 QTC Calculation: 416 R Axis:   -163 Text Interpretation:  Atrial fibrillation Right axis deviation Nonspecific repol abnormality, diffuse leads Abnormal ekg Confirmed by Carmin Muskrat  MD ([id number]) on 03/03/2016 1:05:46 PM       Radiology Dg Chest Portable 1 View  Result Date:  03/03/2016 CLINICAL DATA:  Syncope. EXAM: PORTABLE CHEST 1 VIEW COMPARISON:  11/17/2012 . FINDINGS: Mediastinum and hilar structures normal. Heart size normal. Low lung volumes . Mild bibasilar subsegmental atelectasis. No pleural effusion pneumothorax. IMPRESSION: Low lung volumes with mild bibasilar subsegmental atelectasis. Electronically Signed   By: Marcello Moores  Register   On: 03/03/2016 13:27    Procedures Procedures (including critical care time)  Medications Ordered in ED Medications  midazolam (VERSED) injection 2 mg (not administered)  ondansetron (ZOFRAN) injection 4 mg (4 mg Intravenous Given 03/03/16 1251)  sodium chloride 0.9 % bolus 1,000 mL (0  mLs Intravenous Stopped 03/03/16 1358)     Initial Impression / Assessment and Plan / ED Course  I have reviewed the triage vital signs and the nursing notes.  Pertinent labs & imaging results that were available during my care of the patient were reviewed by me and considered in my medical decision making (see chart for details).  Clinical Course    12:59 PM Patient Had an episode of 15 seconds, sinus pause / asystole, during which she was unresponsive. With stimulation the patient again became talkative. Subsequently, the patient developed tachycardia, but then had atrial fibrillation visible on the monitor, rate 60/80, variable, abnormal Patient has no history of atrial fibrillation. Review of the EMS rhythm strips demonstrate sinus rhythm in route w/o no A. fib.   Patient's son is now present. He states that the patient has no cardiac history, has had worsening dementia, but has been in her usual state of medical health until today. He notes that the patient may have taken two doses of Rivastigmine, but is sure that it was only two maximum, as the family administers the patient's meds reliably.    Update: Patient had an additional episode of bradycardia w HR ~20.  With stimulation the HR again increased.  3:26 PM After  further discussion with the patient's family, and cardiology, there is some concern for possible medication related arrhythmia, syncope. Cardiology requests internal medicine evaluation, admission. On repeat exam the patient remains in similar condition.   3:58 PM After several EKG study demonstrated persistent A. fib, the patient now has EKG with sinus rhythm, no ischemic changes, low voltage, artifact, rate 72, abnormal   Final Clinical Impressions(s) / ED Diagnoses  Patient presents with concern for new nausea, vomiting, diarrhea, weakness, and episode of syncope. Patient's dementia, complicating the history of present illness. Over, after arrival here, the patient was found to have new arrhythmia, with vertebral rate, A. fib, and has several episodes of bradycardia, and one episode of asystole. Patient does have recent history of changing medication for dementia, and is possibly contributory. Other initial studies are reassuring, no evidence for ongoing coronary ischemia, no substantial evidence for infection.  Patient's case discussed with cardiology, and given episode of pulselessness, new arrhythmia, the patient reported admission for further evaluation and management.  CRITICAL CARE Performed by: Carmin Muskrat Total critical care time: 40 minutes Critical care time was exclusive of separately billable procedures and treating other patients. Critical care was necessary to treat or prevent imminent or life-threatening deterioration. Critical care was time spent personally by me on the following activities: development of treatment plan with patient and/or surrogate as well as nursing, discussions with consultants, evaluation of patient's response to treatment, examination of patient, obtaining history from patient or surrogate, ordering and performing treatments and interventions, ordering and review of laboratory studies, ordering and review of radiographic studies, pulse oximetry and  re-evaluation of patient's condition.     Carmin Muskrat, MD 03/03/16 [place], MD 03/03/16 [phone]

## 2016-03-03 NOTE — ED Notes (Signed)
Placed pt on zoll.  

## 2016-03-03 NOTE — ED Notes (Signed)
MD notified of rectal temp. Gave okay to D/C [facility]

## 2016-03-04 ENCOUNTER — Inpatient Hospital Stay (HOSPITAL_COMMUNITY): Payer: Medicare Other

## 2016-03-04 DIAGNOSIS — I4891 Unspecified atrial fibrillation: Secondary | ICD-10-CM

## 2016-03-04 DIAGNOSIS — I495 Sick sinus syndrome: Secondary | ICD-10-CM

## 2016-03-04 LAB — BASIC METABOLIC PANEL
Anion gap: 7 (ref 5–15)
BUN: 10 mg/dL (ref 6–20)
CO2: 24 mmol/L (ref 22–32)
CREATININE: 1.07 mg/dL — AB (ref 0.44–1.00)
Calcium: 8.6 mg/dL — ABNORMAL LOW (ref 8.9–10.3)
Chloride: 106 mmol/L (ref 101–111)
GFR calc Af Amer: 53 mL/min — ABNORMAL LOW (ref >60)
GFR, EST NON AFRICAN AMERICAN: 46 mL/min — AB (ref >60)
Glucose, Bld: 107 mg/dL — ABNORMAL HIGH (ref 65–99)
Potassium: 3.3 mmol/L — ABNORMAL LOW (ref 3.5–5.1)
SODIUM: 137 mmol/L (ref 135–145)

## 2016-03-04 LAB — PHOSPHORUS: Phosphorus: 3.3 mg/dL (ref 2.5–4.6)

## 2016-03-04 LAB — CBC
HCT: 32.1 % — ABNORMAL LOW (ref 36.0–46.0)
Hemoglobin: 10.5 g/dL — ABNORMAL LOW (ref 12.0–15.0)
MCH: 30.7 pg (ref 26.0–34.0)
MCHC: 32.7 g/dL (ref 30.0–36.0)
MCV: 93.9 fL (ref 78.0–100.0)
PLATELETS: 134 10*3/uL — AB (ref 150–400)
RBC: 3.42 MIL/uL — ABNORMAL LOW (ref 3.87–5.11)
RDW: 13.3 % (ref 11.5–15.5)
WBC: 5.3 10*3/uL (ref 4.0–10.5)

## 2016-03-04 LAB — MAGNESIUM: MAGNESIUM: 1.8 mg/dL (ref 1.7–2.4)

## 2016-03-04 LAB — MRSA PCR SCREENING: MRSA by PCR: NEGATIVE

## 2016-03-04 MED ORDER — LEVOTHYROXINE SODIUM 25 MCG PO TABS
125.0000 ug | ORAL_TABLET | Freq: Every day | ORAL | Status: DC
  Filled 2016-03-04: qty 1

## 2016-03-04 MED ORDER — POTASSIUM CHLORIDE CRYS ER 20 MEQ PO TBCR
20.0000 meq | EXTENDED_RELEASE_TABLET | Freq: Once | ORAL | Status: AC
  Administered 2016-03-04: 20 meq via ORAL
  Filled 2016-03-04: qty 1

## 2016-03-04 NOTE — Progress Notes (Signed)
Pt transported to [facility] via bed with RN and NT present.  Vitals and assessment stable. Oncoming nurse present for handoff.

## 2016-03-04 NOTE — Progress Notes (Signed)
SUBJECTIVE: The patient states that she doesn't feel good today but is unable to articulate why   CURRENT MEDICATIONS: . heparin  5,000 Units Subcutaneous Q8H  . levothyroxine  62.5 mcg Intravenous Daily  . memantine  10 mg Oral BID   . sodium chloride 75 mL/hr at 03/03/16 2119    OBJECTIVE: Physical Exam: Vitals:   03/04/16 0500 03/04/16 0600 03/04/16 0700 03/04/16 0800  BP: (!) 144/67 (!) 143/65 133/62 117/62  Pulse: 62 60 (!) 59 (!) 59  Resp: 14 10 16 19   Temp:      TempSrc:      SpO2: 98% 95% 92% 90%  Weight:      Height:        Intake/Output Summary (Last 24 hours) at 03/04/16 0846 Last data filed at 03/04/16 0800  Gross per 24 hour  Intake             1300 ml  Output              150 ml  Net             1150 ml    Telemetry reveals sinus rhythm/sinus brady   GEN- The patient is elderly appearing, sleeping but arouses Head- normocephalic, atraumatic Eyes-  Sclera clear, conjunctiva pink Ears- hearing intact Oropharynx- clear Neck- supple  Lungs- Clear to ausculation bilaterally, normal work of breathing Heart- Regular rate and rhythm  GI- soft, NT, ND, + BS Extremities- no clubbing, cyanosis, or edema Skin- no rash or lesion Psych- euthymic mood Neuro- strength and sensation are intact  LABS: Basic Metabolic Panel:  Recent Labs  03/03/16 1528 03/04/16 0803  NA 140 137  K 3.1* 3.3*  CL 110 106  CO2 18* 24  GLUCOSE 162* 107*  BUN 12 10  CREATININE 1.29* 1.07*  CALCIUM 9.3 8.6*  MG  --  1.8  PHOS  --  3.3   Liver Function Tests:  Recent Labs  03/03/16 1528  AST 19  ALT 11*  ALKPHOS 56  BILITOT 1.6*  PROT 6.9  ALBUMIN 4.2    Recent Labs  03/03/16 1528  LIPASE 24   CBC:  Recent Labs  03/03/16 1528 03/04/16 0803  WBC 9.9 5.3  NEUTROABS 7.8*  --   HGB 12.9 10.5*  HCT 38.8 32.1*  MCV 92.4 93.9  PLT 150 134*   Thyroid Function Tests:  Recent Labs  03/03/16 2137  TSH 0.128*    RADIOLOGY: Dg Chest Port 1 View  Result Date: 03/04/2016 CLINICAL DATA:  Respiratory failure EXAM: PORTABLE CHEST 1 VIEW COMPARISON:  March 03, 2016 FINDINGS: There is no edema or consolidation. Heart size and pulmonary vascularity are normal. No adenopathy. No pneumothorax. There is mid and lower thoracic levoscoliosis. IMPRESSION: No edema or consolidation. Electronically Signed   By: Lowella Grip III M.D.   On: 03/04/2016 07:45    ASSESSMENT AND PLAN:  Active Problems:   Syncope  1.  New onset atrial fibrillation with RVR and pauses of up to 15 seconds. The patient has newly diagnosed atrial fibrillation in the setting of taking 2 doses of Exelon. She also has had periods of asystole up to 15 seconds with syncope. This is the same symptom she had after taking a dose of Exelon a month ago at which time her sons stopped putting the med in her pill box.  CHADS2VASC is at least 4 - would not anticoagulate for now with increased fall risks unless recurrent sustained AF  Now back in SR with no further significant AF or pauses with holding Exelon.   2.  HTN Stable No change required today  3.  Dementia Per primary team   Ok to transfer to tele from EP standpoint.   Chanetta Marshall, NP 03/04/2016 8:49 AM  Electrophysiology team to see as needed while here. Please call with questions. On exam, RRR.  No pauses overnight. Ok to transfer to telemetry. If no further arrhythmias overnight, could DC back to home tomorrow from EP standpoint.  Electrophysiology team to see as needed while here. Please call with questions.  Thompson Grayer MD, [facility] 03/04/2016 5:08 PM

## 2016-03-04 NOTE — Progress Notes (Signed)
PULMONARY / CRITICAL CARE MEDICINE   Name: Morgan Weaver MRN: [id number] DOB: Feb 19, 1929    ADMISSION DATE:  03/03/2016 CONSULTATION DATE:  03/03/16  REFERRING MD:  Oleta Mouse - EDP  CHIEF COMPLAINT:  Syncope  HISTORY OF PRESENT ILLNESS:  Morgan Weaver is a 82 y.o. female with PMH as outlined below. She was brought to [facility] ED 9/19 with syncope.  She had a similar episode about 4 months prior after she been started on Exelon by neurology that same day. Exelon was then DC by neuro. On morning of 9/19, the exelon was mistakenly put into her daily pill box and she took it along with the remainder of her daily pills.On EMS arrival to her home, she had another syncopal episode followed by 15-20 seconds of sinus pause / asystole.  In ER, she had AF with RVR as well as episodes of sinus pauses. Due to her sinus pauses, PCCM was asked to admit to ICU, at which point her sons agreed to limited code status. They would only allow for defibrillation in the setting VT/VF arrest  SUBJECTIVE:  Does not remember feeling bad, so she is not sure that she is feeling better. She is glad to hear she is doing better.  VITAL SIGNS: BP 129/78   Pulse (!) 30   Temp 99.1 F (37.3 C) (Oral)   Resp (!) 25   Ht 5\' 6"  (1.676 m)   Wt 60 kg (132 lb 4.4 oz)   SpO2 (!) 67%   BMI 21.35 kg/m   HEMODYNAMICS:    VENTILATOR SETTINGS:    INTAKE / OUTPUT: I/O last 3 completed shifts: In: 1225 [I.V.:225; IV Piggyback:1000] Out: 150 [Urine:150]   PHYSICAL EXAMINATION: General: Elderly female, frail, in NAD. Neuro: Awake and alert.  [place], confused as to events of illness. HEENT: [place]/AT. PERRL, sclerae anicteric. Cardiovascular: regular, no M/R/G.  Lungs: Respirations even and unlabored.  CTA bilaterally, No W/R/R. Abdomen: BS x 4, soft, NT/ND.  Musculoskeletal: No gross deformities, no edema.  Skin: Intact, warm, no rashes.  LABS:  BMET  Recent Labs Lab 03/03/16 1434 03/03/16 1528 03/04/16 0803  NA 140 140  137  K 3.0* 3.1* 3.3*  CL 107 110 106  CO2  --  18* 24  BUN 12 12 10   CREATININE 1.00 1.29* 1.07*  GLUCOSE 153* 162* 107*    Electrolytes  Recent Labs Lab 03/03/16 1528 03/04/16 0803  CALCIUM 9.3 8.6*  MG  --  1.8  PHOS  --  3.3    CBC  Recent Labs Lab 03/03/16 1434 03/03/16 1528 03/04/16 0803  WBC  --  9.9 5.3  HGB 11.9* 12.9 10.5*  HCT 35.0* 38.8 32.1*  PLT  --  150 134*    Coag's No results for input(s): APTT, INR in the last 168 hours.  Sepsis Markers  Recent Labs Lab 03/03/16 1310 03/03/16 2131  LATICACIDVEN 2.53* 2.2*    ABG No results for input(s): PHART, PCO2ART, PO2ART in the last 168 hours.  Liver Enzymes  Recent Labs Lab 03/03/16 1528  AST 19  ALT 11*  ALKPHOS 56  BILITOT 1.6*  ALBUMIN 4.2    Cardiac Enzymes No results for input(s): TROPONINI, PROBNP in the last 168 hours.  Glucose No results for input(s): GLUCAP in the last 168 hours.  Imaging Dg Chest Port 1 View  Result Date: 03/04/2016 CLINICAL DATA:  Respiratory failure EXAM: PORTABLE CHEST 1 VIEW COMPARISON:  March 03, 2016 FINDINGS: There is no edema or consolidation. Heart  size and pulmonary vascularity are normal. No adenopathy. No pneumothorax. There is mid and lower thoracic levoscoliosis. IMPRESSION: No edema or consolidation. Electronically Signed   By: Lowella Grip III M.D.   On: 03/04/2016 07:45     STUDIES:  CXR 9/19 > low volumes  CULTURES: None.  ANTIBIOTICS: None.  SIGNIFICANT EVENTS: 9/19 > admit.  LINES/TUBES: None.  DISCUSSION: 82 y.o. female admitted 9/19 with syncope due to sinus pauses.  Cardiology feels this is due to medication effect (Exelon).  Recommending d/c Exelon and observing with hopes to avoid pacing.  PCCM asked to admit to ICU.  Note, LIMITED CODE STATUS (no CPR, no ACLS medications, no intubation, defibrillation OK). 9/20 she is much improved, back in sinus rhythm with no complaints. No additional periods of asystole.    ASSESSMENT / PLAN:  CARDIOVASCULAR A:  AF with RVR - currently rate controlled. Sinus pauses - cardiology feels due to medication effect (Exelon). Hx HTN, HLD. P:  Defer anticoagulation decision to cardiology. Cardiology following, management per them. Exelon DC'd  PULMONARY A: Atelectasis. P:   Incentive spirometry.  RENAL A:   Hypokalemia. AKI. P:   NS @ 75. Correct electrolytes as indicated. BMP in AM.  GASTROINTESTINAL A:   Nutrition. P:   Heart healthy diet.  HEMATOLOGIC A:   VTE Prophylaxis. P:  SCD's / Heparin. CBC in AM.  INFECTIOUS A:   No indication of infection. P:   Monitor clinically.  ENDOCRINE A:   Hypothyroidism. P:   Continue preadmission synthroid, change to IV formulation.  NEUROLOGIC A:   Dementia. P:   Continue preadmission memantine. Hold preadmission exelon, would d/c permanently given cardiac adverse effects.  Family updated: Extensive discussion with pt's two sons and Dr. Nelda Marseille.  Given her age and history, decision was made to list her as [name] (no CPR, no ACLS medications, no intubation, defibrillation OK).  Interdisciplinary Family Meeting v Palliative Care Meeting:  Due by: 9/26.  Transfer to [place], AGACNP-BC [place] [facility]ology/Critical Care Pager [phone] or [phone]  03/04/2016 1:59 PM

## 2016-03-05 DIAGNOSIS — I4891 Unspecified atrial fibrillation: Secondary | ICD-10-CM

## 2016-03-05 LAB — BASIC METABOLIC PANEL
ANION GAP: 6 (ref 5–15)
BUN: 10 mg/dL (ref 6–20)
CALCIUM: 8.9 mg/dL (ref 8.9–10.3)
CHLORIDE: 108 mmol/L (ref 101–111)
CO2: 26 mmol/L (ref 22–32)
Creatinine, Ser: 1.18 mg/dL — ABNORMAL HIGH (ref 0.44–1.00)
GFR calc non Af Amer: 41 mL/min — ABNORMAL LOW (ref >60)
GFR, EST AFRICAN AMERICAN: 47 mL/min — AB (ref >60)
Glucose, Bld: 120 mg/dL — ABNORMAL HIGH (ref 65–99)
POTASSIUM: 3.7 mmol/L (ref 3.5–5.1)
Sodium: 140 mmol/L (ref 135–145)

## 2016-03-05 LAB — CBC
HEMATOCRIT: 34.7 % — AB (ref 36.0–46.0)
HEMOGLOBIN: 11.4 g/dL — AB (ref 12.0–15.0)
MCH: 31 pg (ref 26.0–34.0)
MCHC: 32.9 g/dL (ref 30.0–36.0)
MCV: 94.3 fL (ref 78.0–100.0)
Platelets: 138 10*3/uL — ABNORMAL LOW (ref 150–400)
RBC: 3.68 MIL/uL — AB (ref 3.87–5.11)
RDW: 13.1 % (ref 11.5–15.5)
WBC: 5.2 10*3/uL (ref 4.0–10.5)

## 2016-03-05 LAB — MAGNESIUM: Magnesium: 1.9 mg/dL (ref 1.7–2.4)

## 2016-03-05 MED ORDER — LEVOTHYROXINE SODIUM 112 MCG PO TABS
112.0000 ug | ORAL_TABLET | Freq: Every day | ORAL | Status: DC
  Administered 2016-03-05: 112 ug via ORAL
  Filled 2016-03-05: qty 1

## 2016-03-05 MED ORDER — LEVOTHYROXINE SODIUM 112 MCG PO TABS: 112.0000 ug | ORAL_TABLET | Freq: Every day | ORAL | 12 refills | Status: DC

## 2016-03-05 NOTE — Progress Notes (Signed)
Pt has orders to be discharged. Pt son Morgan Weaver called regarding discharge. Discharge instructions given to pt son Morgan Weaver due to pt having dementia. Pt son has no additional questions at this time. Medication regimen reviewed and pt son educated. Pt son verbalized understanding and has no additional questions. Telemetry box removed. IV removed and site in good condition. Pt stable and waiting for transportation.   Maurene Capes RN

## 2016-03-05 NOTE — Discharge Summary (Signed)
Physician Discharge Summary       Patient ID: Morgan Weaver MRN: [id number] DOB/AGE: [birth year] 82 y.o.  Admit date: 03/03/2016 Discharge date: 03/05/2016  Discharge Diagnoses:   AF with RVR  Sinus pauses  HTN HLD Atelectasis. Hypokalemia. AKI. Hypothyroidism. Dementia.  Detailed Hospital Course:    Morgan Weaver is a 82 y.o. female with PMH as outlined below. She was brought to [facility] ED 9/19 with syncope.  She had a similar episode about 4 months prior after she been started on Exelon by neurology that same day. Exelon was then DC by neuro. On morning of 9/19, the exelon was mistakenly put into her daily pill box and she took it along with the remainder of her daily pills.On EMS arrival to her home, she had another syncopal episode followed by 15-20 seconds of sinus pause / asystole.  In ER, she had AF with RVR as well as episodes of sinus pauses. Due to her sinus pauses, she was admitted to the intensive care. Cardiology was consulted. They felt her AF issues and pause events were d/t the Exelon. This medication was discontinued, her EKG returned to NSR and stabilized. She was moved to telemetry on 9/20, monitored over-night w/out issue. She was cleared for d/c under the care of her sons on 9/21    Discharge Plan by active problems   AF with RVR - currently rate controlled. Sinus pauses - cardiology feels due to medication effect (Exelon). -->now resolved Hx HTN, HLD. Plan:  Defer anticoagulation decision to cardiology. Cardiology following, management per them. Exelon DC'd  Hypothyroidism. Plan:   Synthroid   Dementia. Plan Per her neurologist    Significant Hospital tests/ studies  Consults: EP    Discharge Exam: BP (!) 159/74 (BP Location: Right Arm)   Pulse 61   Temp 98 F (36.7 C) (Oral)   Resp 18   Ht 5\' 6"  (1.676 m)   Wt 130 lb 9.6 oz (59.2 kg) Comment: scale c  SpO2 100%   BMI 21.08 kg/m   General: Elderly female, frail, in NAD. Neuro: Awake  and alert.  Demenita, confused as to events of illness. HEENT: [place]/AT. PERRL, sclerae anicteric. Cardiovascular: regular, no M/R/G.  Lungs: Respirations even and unlabored.  CTA bilaterally, No W/R/R. Abdomen: BS x 4, soft, NT/ND.  Musculoskeletal: No gross deformities, no edema.  Skin: Intact, warm, no rashes.  Labs at discharge Lab Results  Component Value Date   CREATININE 1.18 (H) 03/05/2016   BUN 10 03/05/2016   NA 140 03/05/2016   K 3.7 03/05/2016   CL 108 03/05/2016   CO2 26 03/05/2016   Lab Results  Component Value Date   WBC 5.2 03/05/2016   HGB 11.4 (L) 03/05/2016   HCT 34.7 (L) 03/05/2016   MCV 94.3 03/05/2016   PLT 138 (L) 03/05/2016   Lab Results  Component Value Date   ALT 11 (L) 03/03/2016   AST 19 03/03/2016   ALKPHOS 56 03/03/2016   BILITOT 1.6 (H) 03/03/2016   Lab Results  Component Value Date   INR 1.0 01/12/2008    Current radiology studies Dg Chest Port 1 View  Result Date: 03/04/2016 CLINICAL DATA:  Respiratory failure EXAM: PORTABLE CHEST 1 VIEW COMPARISON:  March 03, 2016 FINDINGS: There is no edema or consolidation. Heart size and pulmonary vascularity are normal. No adenopathy. No pneumothorax. There is mid and lower thoracic levoscoliosis. IMPRESSION: No edema or consolidation. Electronically Signed   By: Lowella Grip III M.D.  On: 03/04/2016 07:45   Dg Chest Portable 1 View  Result Date: 03/03/2016 CLINICAL DATA:  Syncope. EXAM: PORTABLE CHEST 1 VIEW COMPARISON:  11/17/2012 . FINDINGS: Mediastinum and hilar structures normal. Heart size normal. Low lung volumes . Mild bibasilar subsegmental atelectasis. No pleural effusion pneumothorax. IMPRESSION: Low lung volumes with mild bibasilar subsegmental atelectasis. Electronically Signed   By: Marcello Moores  Register   On: 03/03/2016 13:27    Disposition:  01-Home or Self Care  Discharge Instructions    Diet - low sodium heart healthy    Complete by:  As directed    Increase activity  slowly    Complete by:  As directed        Medication List    STOP taking these medications   memantine 10 MG tablet Commonly known as:  NAMENDA   rivastigmine 1.5 MG capsule Commonly known as:  EXELON     TAKE these medications   cyanocobalamin 1000 MCG/ML injection Commonly known as:  (VITAMIN B-12) Inject 1 mL (1,000 mcg total) into the skin once. 1069mcg injection once daily x 5 days, then once weekly for 4 weeks, then once a month   GNP GINGKO BILOBA EXTRACT 60 MG Caps Generic drug:  Ginkgo Biloba Extract Take 1 capsule by mouth daily as needed (for memory).   levothyroxine 112 MCG tablet Commonly known as:  SYNTHROID, LEVOTHROID Take 1 tablet (112 mcg total) by mouth daily before breakfast. Start taking on:  03/06/2016 What changed:  medication strength  how much to take        Discharged Condition: good Spoke w/ her son Morgan Weaver re: plan of care   Physician Statement:   The Patient was personally examined, the discharge assessment and plan has been personally reviewed and I agree with ACNP Babcock's assessment and plan. > 30 minutes of time have been dedicated to discharge assessment, planning and discharge instructions.   Signed: Clementeen Graham 03/05/2016, 10:28 AM  Chesley Mires, MD [facility] 03/05/2016, 11:58 AM Pager:  [phone] After 3pm call: [phone]

## 2016-03-31 ENCOUNTER — Ambulatory Visit: Payer: Medicare Other | Admitting: Adult Health

## 2016-06-24 ENCOUNTER — Ambulatory Visit: Payer: Medicare Other | Admitting: Adult Health

## 2017-03-03 ENCOUNTER — Emergency Department (HOSPITAL_COMMUNITY): Payer: Medicare Other

## 2017-03-03 ENCOUNTER — Observation Stay (HOSPITAL_COMMUNITY)
Admission: EM | Admit: 2017-03-03 | Discharge: 2017-03-05 | Disposition: A | Discharge status: Home / Self Care | Payer: Medicare Other | Attending: Family Medicine | Admitting: Family Medicine

## 2017-03-03 ENCOUNTER — Encounter (HOSPITAL_COMMUNITY): Payer: Self-pay

## 2017-03-03 DIAGNOSIS — M8580 Other specified disorders of bone density and structure, unspecified site: Secondary | ICD-10-CM | POA: Diagnosis not present

## 2017-03-03 DIAGNOSIS — I700 Atherosclerosis of aorta: Secondary | ICD-10-CM | POA: Diagnosis not present

## 2017-03-03 DIAGNOSIS — E538 Deficiency of other specified B group vitamins: Secondary | ICD-10-CM | POA: Diagnosis not present

## 2017-03-03 DIAGNOSIS — Z882 Allergy status to sulfonamides status: Secondary | ICD-10-CM | POA: Insufficient documentation

## 2017-03-03 DIAGNOSIS — I480 Paroxysmal atrial fibrillation: Secondary | ICD-10-CM | POA: Diagnosis not present

## 2017-03-03 DIAGNOSIS — Z8249 Family history of ischemic heart disease and other diseases of the circulatory system: Secondary | ICD-10-CM | POA: Insufficient documentation

## 2017-03-03 DIAGNOSIS — R001 Bradycardia, unspecified: Secondary | ICD-10-CM

## 2017-03-03 DIAGNOSIS — Z87891 Personal history of nicotine dependence: Secondary | ICD-10-CM | POA: Diagnosis not present

## 2017-03-03 DIAGNOSIS — D164 Benign neoplasm of bones of skull and face: Secondary | ICD-10-CM | POA: Diagnosis not present

## 2017-03-03 DIAGNOSIS — I6523 Occlusion and stenosis of bilateral carotid arteries: Secondary | ICD-10-CM | POA: Insufficient documentation

## 2017-03-03 DIAGNOSIS — R55 Syncope and collapse: Secondary | ICD-10-CM | POA: Diagnosis present

## 2017-03-03 DIAGNOSIS — M47812 Spondylosis without myelopathy or radiculopathy, cervical region: Secondary | ICD-10-CM | POA: Insufficient documentation

## 2017-03-03 DIAGNOSIS — Z881 Allergy status to other antibiotic agents status: Secondary | ICD-10-CM | POA: Insufficient documentation

## 2017-03-03 DIAGNOSIS — F0390 Unspecified dementia without behavioral disturbance: Secondary | ICD-10-CM | POA: Insufficient documentation

## 2017-03-03 DIAGNOSIS — R011 Cardiac murmur, unspecified: Secondary | ICD-10-CM | POA: Diagnosis not present

## 2017-03-03 DIAGNOSIS — Z888 Allergy status to other drugs, medicaments and biological substances status: Secondary | ICD-10-CM | POA: Insufficient documentation

## 2017-03-03 DIAGNOSIS — I071 Rheumatic tricuspid insufficiency: Secondary | ICD-10-CM | POA: Insufficient documentation

## 2017-03-03 DIAGNOSIS — E039 Hypothyroidism, unspecified: Secondary | ICD-10-CM | POA: Insufficient documentation

## 2017-03-03 DIAGNOSIS — M4180 Other forms of scoliosis, site unspecified: Secondary | ICD-10-CM | POA: Insufficient documentation

## 2017-03-03 DIAGNOSIS — G3189 Other specified degenerative diseases of nervous system: Secondary | ICD-10-CM | POA: Insufficient documentation

## 2017-03-03 DIAGNOSIS — Z96642 Presence of left artificial hip joint: Secondary | ICD-10-CM | POA: Diagnosis not present

## 2017-03-03 DIAGNOSIS — Z90710 Acquired absence of both cervix and uterus: Secondary | ICD-10-CM | POA: Diagnosis not present

## 2017-03-03 DIAGNOSIS — I10 Essential (primary) hypertension: Secondary | ICD-10-CM | POA: Insufficient documentation

## 2017-03-03 DIAGNOSIS — Z79899 Other long term (current) drug therapy: Secondary | ICD-10-CM | POA: Insufficient documentation

## 2017-03-03 DIAGNOSIS — Z23 Encounter for immunization: Secondary | ICD-10-CM | POA: Insufficient documentation

## 2017-03-03 DIAGNOSIS — E785 Hyperlipidemia, unspecified: Secondary | ICD-10-CM | POA: Diagnosis not present

## 2017-03-03 LAB — CBC WITH DIFFERENTIAL/PLATELET
Basophils Absolute: 0 10*3/uL (ref 0.0–0.1)
Basophils Relative: 0 %
EOS PCT: 0 %
Eosinophils Absolute: 0.1 10*3/uL (ref 0.0–0.7)
HCT: 38.2 % (ref 36.0–46.0)
Hemoglobin: 13 g/dL (ref 12.0–15.0)
LYMPHS ABS: 1 10*3/uL (ref 0.7–4.0)
LYMPHS PCT: 8 %
MCH: 31.6 pg (ref 26.0–34.0)
MCHC: 34 g/dL (ref 30.0–36.0)
MCV: 92.7 fL (ref 78.0–100.0)
MONO ABS: 0.8 10*3/uL (ref 0.1–1.0)
MONOS PCT: 7 %
Neutro Abs: 10.1 10*3/uL — ABNORMAL HIGH (ref 1.7–7.7)
Neutrophils Relative %: 85 %
PLATELETS: 191 10*3/uL (ref 150–400)
RBC: 4.12 MIL/uL (ref 3.87–5.11)
RDW: 13.5 % (ref 11.5–15.5)
WBC: 12 10*3/uL — ABNORMAL HIGH (ref 4.0–10.5)

## 2017-03-03 LAB — CBC
HEMATOCRIT: 38.2 % (ref 36.0–46.0)
HEMOGLOBIN: 12.9 g/dL (ref 12.0–15.0)
MCH: 31.3 pg (ref 26.0–34.0)
MCHC: 33.8 g/dL (ref 30.0–36.0)
MCV: 92.7 fL (ref 78.0–100.0)
Platelets: 220 10*3/uL (ref 150–400)
RBC: 4.12 MIL/uL (ref 3.87–5.11)
RDW: 13.8 % (ref 11.5–15.5)
WBC: 9.7 10*3/uL (ref 4.0–10.5)

## 2017-03-03 LAB — URINALYSIS, ROUTINE W REFLEX MICROSCOPIC
Bilirubin Urine: NEGATIVE
GLUCOSE, UA: NEGATIVE mg/dL
HGB URINE DIPSTICK: NEGATIVE
Ketones, ur: 20 mg/dL — AB
Leukocytes, UA: NEGATIVE
Nitrite: NEGATIVE
Protein, ur: NEGATIVE mg/dL
Specific Gravity, Urine: 1.017 (ref 1.005–1.030)
pH: 6 (ref 5.0–8.0)

## 2017-03-03 LAB — BASIC METABOLIC PANEL
Anion gap: 12 (ref 5–15)
BUN: 15 mg/dL (ref 6–20)
CO2: 21 mmol/L — ABNORMAL LOW (ref 22–32)
Calcium: 9.5 mg/dL (ref 8.9–10.3)
Chloride: 97 mmol/L — ABNORMAL LOW (ref 101–111)
Creatinine, Ser: 1.14 mg/dL — ABNORMAL HIGH (ref 0.44–1.00)
GFR calc Af Amer: 49 mL/min — ABNORMAL LOW (ref >60)
GFR, EST NON AFRICAN AMERICAN: 42 mL/min — AB (ref >60)
GLUCOSE: 109 mg/dL — AB (ref 65–99)
POTASSIUM: 4.4 mmol/L (ref 3.5–5.1)
Sodium: 130 mmol/L — ABNORMAL LOW (ref 135–145)

## 2017-03-03 LAB — CREATININE, SERUM
CREATININE: 1.24 mg/dL — AB (ref 0.44–1.00)
GFR, EST AFRICAN AMERICAN: 44 mL/min — AB (ref >60)
GFR, EST NON AFRICAN AMERICAN: 38 mL/min — AB (ref >60)

## 2017-03-03 LAB — TROPONIN I: Troponin I: <0.03 ng/mL (ref <0.03)

## 2017-03-03 LAB — I-STAT TROPONIN, ED: Troponin i, poc: 0 ng/mL (ref 0.00–0.08)

## 2017-03-03 LAB — TSH: TSH: 2.627 u[IU]/mL (ref 0.350–4.500)

## 2017-03-03 MED ORDER — ENOXAPARIN SODIUM 40 MG/0.4ML ~~LOC~~ SOLN
40.0000 mg | SUBCUTANEOUS | Status: DC
  Administered 2017-03-04 – 2017-03-05 (×2): 40 mg via SUBCUTANEOUS
  Filled 2017-03-03 (×2): qty 0.4

## 2017-03-03 MED ORDER — ENSURE ENLIVE PO LIQD
237.0000 mL | Freq: Two times a day (BID) | ORAL | Status: DC
  Administered 2017-03-04 (×2): 237 mL via ORAL

## 2017-03-03 MED ORDER — PNEUMOCOCCAL VAC POLYVALENT 25 MCG/0.5ML IJ INJ
0.5000 mL | INJECTION | INTRAMUSCULAR | Status: AC
  Administered 2017-03-04: 0.5 mL via INTRAMUSCULAR
  Filled 2017-03-03: qty 0.5

## 2017-03-03 MED ORDER — LEVOTHYROXINE SODIUM 25 MCG PO TABS
125.0000 ug | ORAL_TABLET | Freq: Every day | ORAL | Status: DC
  Administered 2017-03-04 – 2017-03-05 (×2): 125 ug via ORAL
  Filled 2017-03-03 (×2): qty 1

## 2017-03-03 MED ORDER — SODIUM CHLORIDE 0.9% FLUSH
3.0000 mL | Freq: Two times a day (BID) | INTRAVENOUS | Status: DC
  Administered 2017-03-04 – 2017-03-05 (×4): 3 mL via INTRAVENOUS

## 2017-03-03 MED ORDER — ONDANSETRON HCL 4 MG/2ML IJ SOLN
4.0000 mg | Freq: Once | INTRAMUSCULAR | Status: AC
  Administered 2017-03-03: 4 mg via INTRAVENOUS
  Filled 2017-03-03: qty 2

## 2017-03-03 MED ORDER — INFLUENZA VAC SPLIT HIGH-DOSE 0.5 ML IM SUSY
0.5000 mL | PREFILLED_SYRINGE | INTRAMUSCULAR | Status: AC
Start: 2017-03-04 — End: 2017-03-04
  Administered 2017-03-04: 0.5 mL via INTRAMUSCULAR
  Filled 2017-03-03: qty 0.5

## 2017-03-03 NOTE — ED Notes (Addendum)
Pt ambulated in room with this RN without difficulty or assistance. Pt was able to do several laps in the trauma room that she was in.  Pt was also able to do this without oxygen.

## 2017-03-03 NOTE — ED Provider Notes (Signed)
[facility] DEPT Provider Note   CSN: [id number] Arrival date & time: 03/03/17  1152     History   Chief Complaint Chief Complaint  Patient presents with  . Loss of Consciousness    HPI Morgan Weaver is a 83 y.o. female.  Patient with history of A. fib with RVR, heart block/sinus pause 2/2 antiarrhythmic medication, no anticoagulation -- presents with chief complaint of syncopal episode. Patient states that she was in her kitchen fixing breakfast when she became nauseous. Patient then had a syncopal episode and woke up on the floor. She was lying on her right side. Incident occurred at some point after 9:30 AM, patient was found approximately 11 AM by her son. Patient states that she feels tired currently present other complaints. No recent fevers, URI symptoms, cough. No nausea, vomiting, diarrhea. No urinary symptoms. No chest pain or shortness of breath. The onset of this condition was acute. The course is resolved. Aggravating factors: none. Alleviating factors: none.        Past Medical History:  Diagnosis Date  . Arthritis   . Hemorrhoid   . Hyperlipidemia   . Hypertension   . Lyme disease   . Ovarian cyst   . Thyroid disease    hypothyroidism  . Torn rotator cuff   . Weight decrease     Patient Active Problem List   Diagnosis Date Noted  . Atrial fibrillation with RVR ([place])   . Heart block   . Transient hypotension   . Paroxysmal atrial fibrillation ([facility])   . Sinus pause   . Hypokalemia   . AKI (acute kidney injury) ([place])   . Dementia 02/15/2015  . Memory loss 08/01/2014  . B12 deficiency 08/01/2014  . Hyponatremia 03/18/2013  . Closed left hip fracture ([place]) 03/16/2013  . Thrombocytopenia, unspecified ([place]) 11/17/2012  . Leukopenia 11/17/2012  . Syncope and collapse 11/16/2012  . Acute UTI 11/16/2012  . Hypertension 11/16/2012  . Hypothyroid 11/16/2012  . Rash 11/16/2012  . Pruritus ani 04/16/2011    Past Surgical History:  Procedure  Laterality Date  . ABDOMINAL HYSTERECTOMY  1970  . APPENDECTOMY  1952  . HIP ARTHROPLASTY Left 03/16/2013   bipolar        Dr Wendee Beavers  . HIP ARTHROPLASTY Left 03/17/2013   Procedure: ARTHROPLASTY BIPOLAR HIP;  Surgeon: Augustin Schooling, MD;  Location: [facility];  Service: Orthopedics;  Laterality: Left;  . KNEE ARTHROSCOPY    . OOPHORECTOMY  1955   removed due to the ovary having gangrene  . OTHER SURGICAL HISTORY     left foot surgery  . OTHER SURGICAL HISTORY     right foot surgery  . Equality   left  . TONSILLECTOMY      OB History    No data available       Home Medications    Prior to Admission medications   Medication Sig Start Date End Date Taking? Authorizing Provider  cyanocobalamin (,VITAMIN B-12,) 1000 MCG/ML injection Inject 1 mL (1,000 mcg total) into the skin once. 1063mcg injection once daily x 5 days, then once weekly for 4 weeks, then once a month Patient not taking: Reported on 03/03/2016 02/23/13   Drema Dallas, DO  Ginkgo Biloba Extract (GNP GINGKO BILOBA EXTRACT) 60 MG CAPS Take 1 capsule by mouth daily as needed (for memory).    [provider]  levothyroxine (SYNTHROID, LEVOTHROID) 112 MCG tablet Take 1 tablet (112 mcg total) by mouth daily before  breakfast. 03/06/16 04/05/16  Erick Colace, NP    Family History Family History  Problem Relation Age of Onset  . Heart disease Father        heart attack  . Hypertension Father   . Other Son        brain tumor     Social History Social History  Substance Use Topics  . Smoking status: Former Research scientist (life sciences)  . Smokeless tobacco: Never Used     Comment: quit-1977  . Alcohol use 8.4 oz/week    14 Glasses of wine per week     Comment: BEFORE DINNER     Allergies   Exelon [rivastigmine tartrate]; Aricept [donepezil hcl]; Mobic [meloxicam]; Namenda [memantine hcl]; Tetracyclines & related; Ancef [cefazolin]; and Sulfa drugs cross reactors   Review of Systems Review of Systems    Constitutional: Negative for fever.  HENT: Negative for rhinorrhea and sore throat.   Eyes: Negative for redness.  Respiratory: Negative for cough.   Cardiovascular: Negative for chest pain.  Gastrointestinal: Positive for nausea. Negative for abdominal pain, diarrhea and vomiting.  Genitourinary: Negative for dysuria and hematuria.  Musculoskeletal: Negative for myalgias.  Skin: Negative for rash.  Neurological: Positive for syncope. Negative for headaches.     Physical Exam Updated Vital Signs BP (!) 142/64   Pulse 61   Temp (!) 96.7 F (35.9 C) (Rectal)   Resp 10   Ht 5\' 6"  (1.676 m)   Wt 59 kg (130 lb)   SpO2 (!) 87%   BMI 20.98 kg/m   Physical Exam  Constitutional: She is oriented to person, place, and time. She appears well-developed and well-nourished.  HENT:  Head: Normocephalic and atraumatic.  Right Ear: Tympanic membrane, external ear and ear canal normal.  Left Ear: Tympanic membrane, external ear and ear canal normal.  Nose: Nose normal.  Mouth/Throat: Uvula is midline, oropharynx is clear and moist and mucous membranes are normal.  Eyes: Pupils are equal, round, and reactive to light. Conjunctivae, EOM and lids are normal. Right eye exhibits no discharge. Left eye exhibits no discharge. Right eye exhibits no nystagmus. Left eye exhibits no nystagmus.  Neck: Normal range of motion. Neck supple.  Cardiovascular: Normal rate, regular rhythm and normal heart sounds.   Pulmonary/Chest: Effort normal and breath sounds normal. No respiratory distress. She has no wheezes. She has no rales.  Abdominal: Soft. There is no tenderness.  Musculoskeletal:       Cervical back: She exhibits normal range of motion, no tenderness and no bony tenderness.  Neurological: She is alert and oriented to person, place, and time. She has normal strength and normal reflexes. No cranial nerve deficit or sensory deficit. She displays a negative Romberg sign. Coordination and gait normal.  GCS eye subscore is 4. GCS verbal subscore is 5. GCS motor subscore is 6.  Skin: Skin is warm and dry.  Psychiatric: She has a normal mood and affect.  Nursing note and vitals reviewed.    ED Treatments / Results  Labs (all labs ordered are listed, but only abnormal results are displayed) Labs Reviewed  CBC WITH DIFFERENTIAL/PLATELET - Abnormal; Notable for the following:       Result Value   WBC 12.0 (*)    Neutro Abs 10.1 (*)    All other components within normal limits  BASIC METABOLIC PANEL - Abnormal; Notable for the following:    Sodium 130 (*)    Chloride 97 (*)    CO2 21 (*)  Glucose, Bld 109 (*)    Creatinine, Ser 1.14 (*)    GFR calc non Af Amer 42 (*)    GFR calc Af Amer 49 (*)    All other components within normal limits  URINALYSIS, ROUTINE W REFLEX MICROSCOPIC - Abnormal; Notable for the following:    Ketones, ur 20 (*)    All other components within normal limits  I-STAT TROPONIN, ED    EKG  EKG Interpretation  Date/Time:  Wednesday March 03 2017 12:13:49 EDT Ventricular Rate:  57 PR Interval:    QRS Duration: 101 QT Interval:  439 QTC Calculation: 428 R Axis:   -17 Text Interpretation:  Sinus rhythm Borderline left axis deviation Low voltage, precordial leads Confirmed by Quintella Reichert [phone]) on 03/03/2017 12:17:13 PM Also confirmed by Quintella Reichert [phone]), editor Philomena Doheny [phone])  on 03/03/2017 12:46:08 PM       Radiology Ct Head Wo Contrast  Result Date: 03/03/2017 CLINICAL DATA:  Fall yesterday from bed.  No loss of consciousness. EXAM: CT HEAD WITHOUT CONTRAST CT CERVICAL SPINE WITHOUT CONTRAST TECHNIQUE: Multidetector CT imaging of the head and cervical spine was performed following the standard protocol without intravenous contrast. Multiplanar CT image reconstructions of the cervical spine were also generated. COMPARISON:  CT head 11/16/2012.  MRI brain 02/20/2013. FINDINGS: CT HEAD FINDINGS Brain: There is no evidence of acute  intracranial hemorrhage, mass lesion, brain edema or extra-axial fluid collection. There is stable mild generalized atrophy with prominence of the ventricles and subarachnoid spaces. There is confluent low-density in the periventricular and subcortical white matter consistent with chronic small vessel ischemic changes. These are mildly progressive. There is no CT evidence of acute cortical infarction. Vascular: Intracranial vascular calcifications. No hyperdense vessel identified. Skull: Negative for fracture or focal lesion. Sinuses/Orbits: There is a small mucous retention cyst in the right frontal sinus with mild mucosal thickening in the right maxillary sinus. The walls of the right frontal sinus are sclerotic, likely reactive. There is a stable small osteoma in the left ethmoid sinus. The visualized paranasal sinuses and mastoid air cells are otherwiseclear. No orbital abnormalities are seen. Other: None. CT CERVICAL SPINE FINDINGS Alignment: Near anatomic. Skull base and vertebrae: No evidence of acute cervical spine fracture or traumatic subluxation. Multilevel endplate degenerative changes are present. Soft tissues and spinal canal: No prevertebral fluid or swelling. No visible canal hematoma. Disc levels: There is multilevel disc space narrowing and uncinate spurring, greatest from C4-5 through C6-7. Moderate facet degenerative changes are present throughout the cervical spine, greatest on the left at C7-T1. These factors contribute to mild-to-moderate foraminal narrowing at multiple levels. Upper chest: Unremarkable. Other: Bilateral carotid atherosclerosis noted. IMPRESSION: 1. No acute intracranial or calvarial findings. 2. Progressive chronic small vessel ischemic changes in the periventricular and subcortical white matter. 3. No evidence of acute cervical spine fracture, traumatic subluxation or static signs of instability. 4. Cervical spondylosis as described. Electronically Signed   By: Richardean Sale M.D.   On: 03/03/2017 13:19   Ct Cervical Spine Wo Contrast  Result Date: 03/03/2017 CLINICAL DATA:  Fall yesterday from bed.  No loss of consciousness. EXAM: CT HEAD WITHOUT CONTRAST CT CERVICAL SPINE WITHOUT CONTRAST TECHNIQUE: Multidetector CT imaging of the head and cervical spine was performed following the standard protocol without intravenous contrast. Multiplanar CT image reconstructions of the cervical spine were also generated. COMPARISON:  CT head 11/16/2012.  MRI brain 02/20/2013. FINDINGS: CT HEAD FINDINGS Brain: There is no evidence of acute intracranial hemorrhage,  mass lesion, brain edema or extra-axial fluid collection. There is stable mild generalized atrophy with prominence of the ventricles and subarachnoid spaces. There is confluent low-density in the periventricular and subcortical white matter consistent with chronic small vessel ischemic changes. These are mildly progressive. There is no CT evidence of acute cortical infarction. Vascular: Intracranial vascular calcifications. No hyperdense vessel identified. Skull: Negative for fracture or focal lesion. Sinuses/Orbits: There is a small mucous retention cyst in the right frontal sinus with mild mucosal thickening in the right maxillary sinus. The walls of the right frontal sinus are sclerotic, likely reactive. There is a stable small osteoma in the left ethmoid sinus. The visualized paranasal sinuses and mastoid air cells are otherwiseclear. No orbital abnormalities are seen. Other: None. CT CERVICAL SPINE FINDINGS Alignment: Near anatomic. Skull base and vertebrae: No evidence of acute cervical spine fracture or traumatic subluxation. Multilevel endplate degenerative changes are present. Soft tissues and spinal canal: No prevertebral fluid or swelling. No visible canal hematoma. Disc levels: There is multilevel disc space narrowing and uncinate spurring, greatest from C4-5 through C6-7. Moderate facet degenerative changes are  present throughout the cervical spine, greatest on the left at C7-T1. These factors contribute to mild-to-moderate foraminal narrowing at multiple levels. Upper chest: Unremarkable. Other: Bilateral carotid atherosclerosis noted. IMPRESSION: 1. No acute intracranial or calvarial findings. 2. Progressive chronic small vessel ischemic changes in the periventricular and subcortical white matter. 3. No evidence of acute cervical spine fracture, traumatic subluxation or static signs of instability. 4. Cervical spondylosis as described. Electronically Signed   By: Richardean Sale M.D.   On: 03/03/2017 13:19    Procedures Procedures (including critical care time)  Medications Ordered in ED Medications  ondansetron (ZOFRAN) injection 4 mg (4 mg Intravenous Given 03/03/17 1352)     Initial Impression / Assessment and Plan / ED Course  I have reviewed the triage vital signs and the nursing notes.  Pertinent labs & imaging results that were available during my care of the patient were reviewed by me and considered in my medical decision making (see chart for details).     Patient seen and examined. Work-up initiated.   Vital signs reviewed and are as follows: BP (!) 142/64   Pulse 61   Temp (!) 96.7 F (35.9 C) (Rectal)   Resp 10   Ht 5\' 6"  (1.676 m)   Wt 59 kg (130 lb)   SpO2 (!) 87%   BMI 20.98 kg/m   Discussed with and seen by Dr. Ralene Bathe.  3:59 PM Discussed findings to this point with two sons at bedside. Discussed admission with family. They are in agreement.   Discussed patient with family med who will see patient.   BP 135/78   Pulse (!) 59   Temp 98.5 F (36.9 C) (Oral)   Resp 10   Ht 5\' 6"  (1.676 m)   Wt 59 kg (130 lb)   SpO2 100%   BMI 20.98 kg/m    Final Clinical Impressions(s) / ED Diagnoses   Final diagnoses:  Syncope, unspecified syncope type  Sinus bradycardia   Admit.   New Prescriptions Current Discharge Medication List       Carlisle Cater,  Hershal Coria 03/03/17 1602    Quintella Reichert, MD 03/04/17 [phone]

## 2017-03-03 NOTE — Progress Notes (Signed)
CALL PAGER [phone] for any questions or notifications regarding this patient  FMTS Attending Note: Morgan Mcmurray MD I have reviewed her chart and discussed wit the resident. History of syncope and very unclear if this incident was true syncope. Difficult to determine HPI in any detail secondary to patient's dementia. Given this complexity, will also treat as if possible TIA and we have initiated that work up /  order set. She had an initial low body temp but that  has resolved quickly so I am not sure it was accurate.. Does not appear to have sepsis as other vital signs are stable; no tachypnea although one brief RR at 26, it was generally 10-20.

## 2017-03-03 NOTE — ED Notes (Signed)
Pt returned from X-ray.  

## 2017-03-03 NOTE — ED Notes (Signed)
Pt encouraged to urinate

## 2017-03-03 NOTE — ED Notes (Signed)
Patient given dinner tray.

## 2017-03-03 NOTE — H&P (Signed)
[facility] Admission History and Physical Service Pager: [phone]  Patient name: Morgan Weaver Medical record number: [id number] Date of birth: [birth year] Age: 83 y.o. Gender: female  Primary Care Provider: Leonard Downing, MD Consultants: None Code Status: Full  Chief Complaint: Syncope  Assessment and Plan: Morgan Weaver is a 83 y.o. female presenting with syncopal episode. PMH is significant for hypothyroidism, dementia.  Syncopal episode Episode was not witnessed and ddx include TIA vs. Orthostatic hypotension vs mechanical fall vs cardiac. Patient has shuffling gait and is off balance at baseline per son, has a history of falls and is likely the cause of her current episode. Also as history of B12 deficiency. Stroke process, TIA unlikely as no neurological deficits were noted, also no seizure activity witnessed.  CT head, neck no acute process. CXR negative. EKG sinus rhythm but difficult to read with artifact. U/A negative but does show ketones, therefore she may be a little dehydrated. CMP wnl. CBC notable for WBC 12. Afebrile on admission. - admit to observation, attending Dr. Nori Riis - ECHO to evaluate for murmur - repeat EKG - trend trop x3 - B12, folate, TSH - orthostatic vitals - cardiac monitoring - PT/OT  Hypothyroidism Home med: Synthroid 161mcg - TSH   HTN Remote history of hypertension, not medically managed. 137/68 on admission. - monitor  FEN/GI: regular diet Prophylaxis: Lovenox  Disposition: Admit to observation  History of Present Illness:  Morgan Weaver is a 83 y.o. female presenting after possible syncopal episode. Patient does not recall incident (patient has dementia). Her son found her on the floor in the kitchen lying on her right side with her eyes closed. Her mentation was at baseline at that time. She did not seem confused. The son thinks she may have vomited as there was some clear fluid on the floor near her.  No loss of bowel or bladder. She only takes synthroid and missed her doses for the past 2 days. Denies HA, chest pain, sob, nausea, abdominal pain, diarrhea, constipation, rhinorrhea, cough.   Has history of syncope 1 year ago which was thought to be due to taking more than prescribed of her dementia medication which has since been discontinued. Sons report that her balance is not good. She lives with her son, is able to bathe and dress herself. She does not cook, drive, or handle finances.   Review Of Systems: Per HPI with the following additions:   Review of Systems  Eyes: Negative for blurred vision.  Respiratory: Negative for shortness of breath.   Cardiovascular: Negative for chest pain and palpitations.  Gastrointestinal: Negative for abdominal pain, nausea and vomiting.  Neurological: Negative for dizziness.    Patient Active Problem List   Diagnosis Date Noted  . Atrial fibrillation with RVR ([place])   . Heart block   . Transient hypotension   . Paroxysmal atrial fibrillation ([facility])   . Sinus pause   . Hypokalemia   . AKI (acute kidney injury) ([place])   . Dementia 02/15/2015  . Memory loss 08/01/2014  . B12 deficiency 08/01/2014  . Hyponatremia 03/18/2013  . Closed left hip fracture ([place]) 03/16/2013  . Thrombocytopenia, unspecified ([place]) 11/17/2012  . Leukopenia 11/17/2012  . Syncope and collapse 11/16/2012  . Acute UTI 11/16/2012  . Hypertension 11/16/2012  . Hypothyroid 11/16/2012  . Rash 11/16/2012  . Pruritus ani 04/16/2011    Past Medical History: Past Medical History:  Diagnosis Date  . Arthritis   . Hemorrhoid   .  Hyperlipidemia   . Hypertension   . Lyme disease   . Ovarian cyst   . Thyroid disease    hypothyroidism  . Torn rotator cuff   . Weight decrease     Past Surgical History: Past Surgical History:  Procedure Laterality Date  . ABDOMINAL HYSTERECTOMY  1970  . APPENDECTOMY  1952  . HIP ARTHROPLASTY Left 03/16/2013   bipolar        Dr Wendee Beavers  .  HIP ARTHROPLASTY Left 03/17/2013   Procedure: ARTHROPLASTY BIPOLAR HIP;  Surgeon: Augustin Schooling, MD;  Location: [facility];  Service: Orthopedics;  Laterality: Left;  . KNEE ARTHROSCOPY    . OOPHORECTOMY  1955   removed due to the ovary having gangrene  . OTHER SURGICAL HISTORY     left foot surgery  . OTHER SURGICAL HISTORY     right foot surgery  . [place]   left  . TONSILLECTOMY      Social History: Social History  Substance Use Topics  . Smoking status: Former Research scientist (life sciences)  . Smokeless tobacco: Never Used     Comment: quit-1977  . Alcohol use 8.4 oz/week    14 Glasses of wine per week     Comment: BEFORE DINNER   Additional social history: drinks about a 6 pack of beer per week. Please also refer to relevant sections of EMR.  Family History: Family History  Problem Relation Age of Onset  . Heart disease Father        heart attack  . Hypertension Father   . Other Son        brain tumor    Allergies and Medications: Allergies  Allergen Reactions  . Exelon [Rivastigmine Tartrate] Other (See Comments)    Asystole/sinus pauses/syncope  . Aricept [Donepezil Hcl] Nausea And Vomiting  . Mobic [Meloxicam] Other (See Comments)    Abdominal pain, ?GI ulcer    . Namenda [Memantine Hcl] Nausea And Vomiting  . Tetracyclines & Related Swelling    Lips swell   . Ancef [Cefazolin] Rash    Diffuse rash with urticaria, no respiratory involvement.  . Sulfa Drugs Cross Reactors Other (See Comments)    per [facility] MAR   No current facility-administered medications on file prior to encounter.    Current Outpatient Prescriptions on File Prior to Encounter  Medication Sig Dispense Refill  . Ginkgo Biloba Extract (GNP GINGKO BILOBA EXTRACT) 60 MG CAPS Take 1 capsule by mouth daily as needed (for memory).    Marland Kitchen levothyroxine (SYNTHROID, LEVOTHROID) 112 MCG tablet Take 1 tablet (112 mcg total) by mouth daily before breakfast. 30 tablet 12    Objective: BP 135/78   Pulse (!)  59   Temp 98.5 F (36.9 C) (Oral)   Resp 10   Ht 5\' 6"  (1.676 m)   Wt 130 lb (59 kg)   SpO2 100%   BMI 20.98 kg/m   Exam: General: laying in bed, in NAD Eyes: PERRL, EOMI ENTM: MMM Cardiovascular: RRR, systolic murmur Respiratory: CTA bilaterally, no wheezes, rales, normal WOB Gastrointestinal: soft, non tender to palpation, not distended, +BS Derm: WWP, no LE edema MSK: Full ROM, strength 5/5 to U/LE bilaterally.  No edema.  Neuro: Alert, oriented to person only. Speech normal.  Romberg negative.  PERRL, Extraocular movements intact.  Intact symmetric sensation to light touch of face and extremities bilaterally.  Hearing grossly intact bilaterally.  Tongue protrudes normally with no deviation.  Shoulder shrug, smile symmetric. Finger to nose normal. Psych:  mood and affect euthymic.  Labs and Imaging: CBC BMET   Recent Labs Lab 03/03/17 1213  WBC 12.0*  HGB 13.0  HCT 38.2  PLT 191    Recent Labs Lab 03/03/17 1213  NA 130*  K 4.4  CL 97*  CO2 21*  BUN 15  CREATININE 1.14*  GLUCOSE 109*  CALCIUM 9.5    EKG: not able to read due to artifact istat trop 0.00  Dg Chest 2 View  Result Date: 03/03/2017 CLINICAL DATA:  Syncope and leukocytosis EXAM: CHEST  2 VIEW COMPARISON:  03/04/2016 FINDINGS: Normal heart size. Stable aortic tortuosity. There is no edema, consolidation, convincing effusion, or pneumothorax. Artifact from EKG leads. No acute osseous finding. Levoscoliosis and osteopenia. IMPRESSION: No acute finding. Electronically Signed   By: Monte Fantasia M.D.   On: 03/03/2017 14:17   Ct Head Wo Contrast  Result Date: 03/03/2017 CLINICAL DATA:  Fall yesterday from bed.  No loss of consciousness. EXAM: CT HEAD WITHOUT CONTRAST CT CERVICAL SPINE WITHOUT CONTRAST TECHNIQUE: Multidetector CT imaging of the head and cervical spine was performed following the standard protocol without intravenous contrast. Multiplanar CT image reconstructions of the cervical spine were  also generated. COMPARISON:  CT head 11/16/2012.  MRI brain 02/20/2013. FINDINGS: CT HEAD FINDINGS Brain: There is no evidence of acute intracranial hemorrhage, mass lesion, brain edema or extra-axial fluid collection. There is stable mild generalized atrophy with prominence of the ventricles and subarachnoid spaces. There is confluent low-density in the periventricular and subcortical white matter consistent with chronic small vessel ischemic changes. These are mildly progressive. There is no CT evidence of acute cortical infarction. Vascular: Intracranial vascular calcifications. No hyperdense vessel identified. Skull: Negative for fracture or focal lesion. Sinuses/Orbits: There is a small mucous retention cyst in the right frontal sinus with mild mucosal thickening in the right maxillary sinus. The walls of the right frontal sinus are sclerotic, likely reactive. There is a stable small osteoma in the left ethmoid sinus. The visualized paranasal sinuses and mastoid air cells are otherwiseclear. No orbital abnormalities are seen. Other: None. CT CERVICAL SPINE FINDINGS Alignment: Near anatomic. Skull base and vertebrae: No evidence of acute cervical spine fracture or traumatic subluxation. Multilevel endplate degenerative changes are present. Soft tissues and spinal canal: No prevertebral fluid or swelling. No visible canal hematoma. Disc levels: There is multilevel disc space narrowing and uncinate spurring, greatest from C4-5 through C6-7. Moderate facet degenerative changes are present throughout the cervical spine, greatest on the left at C7-T1. These factors contribute to mild-to-moderate foraminal narrowing at multiple levels. Upper chest: Unremarkable. Other: Bilateral carotid atherosclerosis noted. IMPRESSION: 1. No acute intracranial or calvarial findings. 2. Progressive chronic small vessel ischemic changes in the periventricular and subcortical white matter. 3. No evidence of acute cervical spine  fracture, traumatic subluxation or static signs of instability. 4. Cervical spondylosis as described. Electronically Signed   By: Richardean Sale M.D.   On: 03/03/2017 13:19   Ct Cervical Spine Wo Contrast  Result Date: 03/03/2017 CLINICAL DATA:  Fall yesterday from bed.  No loss of consciousness. EXAM: CT HEAD WITHOUT CONTRAST CT CERVICAL SPINE WITHOUT CONTRAST TECHNIQUE: Multidetector CT imaging of the head and cervical spine was performed following the standard protocol without intravenous contrast. Multiplanar CT image reconstructions of the cervical spine were also generated. COMPARISON:  CT head 11/16/2012.  MRI brain 02/20/2013. FINDINGS: CT HEAD FINDINGS Brain: There is no evidence of acute intracranial hemorrhage, mass lesion, brain edema or extra-axial fluid collection. There is  stable mild generalized atrophy with prominence of the ventricles and subarachnoid spaces. There is confluent low-density in the periventricular and subcortical white matter consistent with chronic small vessel ischemic changes. These are mildly progressive. There is no CT evidence of acute cortical infarction. Vascular: Intracranial vascular calcifications. No hyperdense vessel identified. Skull: Negative for fracture or focal lesion. Sinuses/Orbits: There is a small mucous retention cyst in the right frontal sinus with mild mucosal thickening in the right maxillary sinus. The walls of the right frontal sinus are sclerotic, likely reactive. There is a stable small osteoma in the left ethmoid sinus. The visualized paranasal sinuses and mastoid air cells are otherwiseclear. No orbital abnormalities are seen. Other: None. CT CERVICAL SPINE FINDINGS Alignment: Near anatomic. Skull base and vertebrae: No evidence of acute cervical spine fracture or traumatic subluxation. Multilevel endplate degenerative changes are present. Soft tissues and spinal canal: No prevertebral fluid or swelling. No visible canal hematoma. Disc levels:  There is multilevel disc space narrowing and uncinate spurring, greatest from C4-5 through C6-7. Moderate facet degenerative changes are present throughout the cervical spine, greatest on the left at C7-T1. These factors contribute to mild-to-moderate foraminal narrowing at multiple levels. Upper chest: Unremarkable. Other: Bilateral carotid atherosclerosis noted. IMPRESSION: 1. No acute intracranial or calvarial findings. 2. Progressive chronic small vessel ischemic changes in the periventricular and subcortical white matter. 3. No evidence of acute cervical spine fracture, traumatic subluxation or static signs of instability. 4. Cervical spondylosis as described. Electronically Signed   By: Richardean Sale M.D.   On: 03/03/2017 13:19   Rory Percy, DO 03/03/2017, 3:59 PM PGY-1, [place] Intern pager: [phone], text pages welcome  UPPER LEVEL ADDENDUM  I have read the above note and made revisions highlighted in blue.  Smiley Houseman, MD PGY-3 [facility] Pager [phone]

## 2017-03-03 NOTE — ED Notes (Signed)
Patient transported to CT 

## 2017-03-03 NOTE — ED Notes (Signed)
Patient transported to X-ray 

## 2017-03-03 NOTE — ED Notes (Addendum)
Attempted Report x2. 

## 2017-03-03 NOTE — ED Triage Notes (Signed)
Per [facility], Pt is coming from home with complaints of syncopal episode this morning. Pt was last seen by son's at 0930. When the returned, pt was found on the floor at 100. Pt was lying on her right lateral side in the kitchen. Pt reports working on some toast at 0930 when she started to "feel like I was going to fall," pt denies LOC and fully falling to the floor. Pt stated she did not hit her head, but that she lowered herself to the floor. Denies pain. With EMS, pt reported some abdominal pain and nausea. Vitals per EMS, 160 CBG, 152/88, HR Initially 48 and then increased to 58 HR. Given 4 mg of Zofran during transport. Pt has some confusion at baseline and son reports cognition to be baseline.

## 2017-03-03 NOTE — ED Notes (Signed)
Attempted report 

## 2017-03-03 NOTE — ED Notes (Signed)
Attempted Report x1.   

## 2017-03-04 ENCOUNTER — Observation Stay (HOSPITAL_COMMUNITY): Payer: Medicare Other

## 2017-03-04 ENCOUNTER — Other Ambulatory Visit (HOSPITAL_COMMUNITY): Payer: Medicare Other

## 2017-03-04 DIAGNOSIS — R55 Syncope and collapse: Principal | ICD-10-CM

## 2017-03-04 DIAGNOSIS — R001 Bradycardia, unspecified: Secondary | ICD-10-CM

## 2017-03-04 LAB — ECHOCARDIOGRAM COMPLETE
E/e' ratio: 10.42
EWDT: 384 ms
FS: 34 % (ref 28–44)
HEIGHTINCHES: 66 in
IVS/LV PW RATIO, ED: 1
LA diam end sys: 42 mm
LA diam index: 2.58 cm/m2
LA vol A4C: 52.2 ml
LASIZE: 42 mm
LV E/e' medial: 10.42
LV PW d: 8.69 mm — AB (ref 0.6–1.1)
LV TDI E'LATERAL: 5.77
LV TDI E'MEDIAL: 5.22
LV e' LATERAL: 5.77 cm/s
LVEEAVG: 10.42
LVOT VTI: 33.2 cm
LVOT area: 2.84 cm2
LVOT diameter: 19 mm
LVOT peak grad rest: 10 mmHg
LVOTPV: 161 cm/s
LVOTSV: 94 mL
Lateral S' vel: 12.1 cm/s
MV Dec: 384
MV pk A vel: 85.4 m/s
MVPKEVEL: 60.1 m/s
RV TAPSE: 22.8 mm
WEIGHTICAEL: 2124.8 [oz_av]

## 2017-03-04 LAB — BASIC METABOLIC PANEL
Anion gap: 9 (ref 5–15)
BUN: 17 mg/dL (ref 6–20)
CHLORIDE: 97 mmol/L — AB (ref 101–111)
CO2: 23 mmol/L (ref 22–32)
Calcium: 8.9 mg/dL (ref 8.9–10.3)
Creatinine, Ser: 1.11 mg/dL — ABNORMAL HIGH (ref 0.44–1.00)
GFR calc non Af Amer: 44 mL/min — ABNORMAL LOW (ref >60)
GFR, EST AFRICAN AMERICAN: 51 mL/min — AB (ref >60)
Glucose, Bld: 86 mg/dL (ref 65–99)
POTASSIUM: 3.8 mmol/L (ref 3.5–5.1)
SODIUM: 129 mmol/L — AB (ref 135–145)

## 2017-03-04 LAB — CBC
HEMATOCRIT: 34.6 % — AB (ref 36.0–46.0)
Hemoglobin: 11.6 g/dL — ABNORMAL LOW (ref 12.0–15.0)
MCH: 31.1 pg (ref 26.0–34.0)
MCHC: 33.5 g/dL (ref 30.0–36.0)
MCV: 92.8 fL (ref 78.0–100.0)
Platelets: 179 10*3/uL (ref 150–400)
RBC: 3.73 MIL/uL — AB (ref 3.87–5.11)
RDW: 13.5 % (ref 11.5–15.5)
WBC: 7.5 10*3/uL (ref 4.0–10.5)

## 2017-03-04 LAB — TROPONIN I
Troponin I: <0.03 ng/mL (ref <0.03)
Troponin I: <0.03 ng/mL (ref <0.03)

## 2017-03-04 LAB — VITAMIN B12: VITAMIN B 12: 509 pg/mL (ref 180–914)

## 2017-03-04 NOTE — Progress Notes (Signed)
  Echocardiogram 2D Echocardiogram has been performed.  Merrie Roof F 03/04/2017, 2:17 PM

## 2017-03-04 NOTE — Progress Notes (Signed)
Nutrition Brief Note  Patient identified on the Malnutrition Screening Tool (MST) Report  Wt Readings from Last 15 Encounters:  03/03/17 132 lb 12.8 oz (60.2 kg)  03/05/16 130 lb 9.6 oz (59.2 kg)  12/23/15 130 lb 9.6 oz (59.2 kg)  02/12/15 139 lb 12.8 oz (63.4 kg)  08/01/14 138 lb 9.6 oz (62.9 kg)  01/29/14 143 lb (64.9 kg)  08/01/13 136 lb (61.7 kg)  02/22/13 124 lb (56.2 kg)  11/18/12 123 lb 12.8 oz (56.2 kg)  04/16/11 132 lb 3.2 oz (60 kg)  02/12/11 132 lb 3.2 oz (60 kg)   Morgan Weaver is a 83 y.o. female presenting with syncopal episode. PMH is significant for hypothyroidism, dementia.  Pt admitted with syncopal episode.   Spoke with pt, who appears much younger than actual age. She reports she is very active and independent at home, often prepares large meals for her family. Pt consumes 3 meals per day and enjoys "country food" (ex beans, cornbread, and fried chicken). Pt consumed 100% of lunch tray.   Pt denies wt loss. Per wt hx, wt has been stable over the past 2-3 years.   Nutrition-Focused physical exam completed. Findings are no fat depletion, no muscle depletion, and no edema.   Body mass index is 21.43 kg/m. Patient meets criteria for normal weight range based on current BMI.   Current diet order is Heart Healthy, patient is consuming approximately 100% of meals at this time. Labs and medications reviewed.   No nutrition interventions warranted at this time. If nutrition issues arise, please consult RD.   Chipper Koudelka A. Jimmye Norman, RD, LDN, CDE Pager: [phone] After hours Pager: [phone]

## 2017-03-04 NOTE — Progress Notes (Signed)
Family Medicine Teaching Service Daily Progress Note Intern Pager: [phone]  Patient name: Morgan Weaver Medical record number: [id number] Date of birth: 06-05-1929 Age: 83 y.o. Gender: female  Primary Care Provider: Leonard Downing, MD Consultants: None Code Status: Full  Pt Overview and Major Events to Date:  9/19 - admitted for workup of syncope  Assessment and Plan: Morgan Weaver is a 83 y.o. female presenting with syncopal episode. PMH is significant for hypothyroidism, dementia.  Syncopal episode Episode was not witnessed and ddx include TIA vs. Orthostatic hypotension vs mechanical fall vs cardiac. Patient has shuffling gait and is off balance at baseline per son, has a history of falls and is likely the cause of her current episode. Also as history of B12 deficiency. Stroke process, TIA unlikely as no neurological deficits were noted, also no seizure activity witnessed.  CT head, neck no acute process. CXR negative. EKG sinus rhythm but difficult to read with artifact. Repeat EKG sinus rhythm. U/A negative but does show ketones, therefore she may be a little dehydrated. CMP wnl. CBC notable for WBC 12 > 7.5. B12 wnl. Trop neg x3. Negative orthostatic vitals.  - ECHO to evaluate for murmur - folate level - cardiac monitoring - PT/OT - MRI brain  Hypothyroidism Home med: Synthroid 128mcg. TSH 2.627 - continue home Synthroid  HTN Remote history of hypertension, not medically managed. 137/68 on admission. - monitor  FEN/GI: regular diet Prophylaxis: Lovenox  Disposition: continue inpatient workup of syncope  Subjective:  Patient doesn't know why she is in the hospital. Doesn't believe she fell or passed out, and thinks it was someone else. Doesn't remember being in the ED yesterday. Denies CP, SOB, lightheadedness, dizziness.  Objective: Temp:  [96.7 F (35.9 C)-98.5 F (36.9 C)] 98.4 F (36.9 C) (09/20 0531) Pulse Rate:  [54-67] 54 (09/20 0531) Resp:   [10-26] 18 (09/20 0531) BP: (122-166)/(64-87) 132/68 (09/20 0531) SpO2:  [87 %-100 %] 96 % (09/20 0531) Weight:  [130 lb (59 kg)-132 lb 12.8 oz (60.2 kg)] 132 lb 12.8 oz (60.2 kg) (09/19 2134)  Physical Exam: General: lying comfortably in bed, in NAD, confused Cardiovascular: RRR, systolic murmur Respiratory: CTA bilaterally Abdomen: soft, non tender to palpation, not distended Extremities: WWP, dorsal pedal pulses appreciated. Neuro: alert, oriented to place and person.  Psych: agitated at being in the hospital, doesn't think she needs to be here  Laboratory:  Recent Labs Lab 03/03/17 1213 03/03/17 2235 03/04/17 0334  WBC 12.0* 9.7 7.5  HGB 13.0 12.9 11.6*  HCT 38.2 38.2 34.6*  PLT 191 220 179    Recent Labs Lab 03/03/17 1213 03/03/17 2235 03/04/17 0334  NA 130*  --  129*  K 4.4  --  3.8  CL 97*  --  97*  CO2 21*  --  23  BUN 15  --  17  CREATININE 1.14* 1.24* 1.11*  CALCIUM 9.5  --  8.9  GLUCOSE 109*  --  86   EKG - sinus rhythm  Imaging/Diagnostic Tests:  CXR 9/19 FINDINGS: Normal heart size. Stable aortic tortuosity. There is no edema, consolidation, convincing effusion, or pneumothorax. Artifact from EKG leads. No acute osseous finding. Levoscoliosis and osteopenia. IMPRESSION: No acute finding.  CT Head/Neck 9/19 FINDINGS Brain: There is no evidence of acute intracranial hemorrhage, mass lesion, brain edema or extra-axial fluid collection. There is stable mild generalized atrophy with prominence of the ventricles and subarachnoid spaces. There is confluent low-density in the periventricular and subcortical white matter consistent with  chronic small vessel ischemic changes. These are mildly progressive. There is no CT evidence of acute cortical infarction. Vascular: Intracranial vascular calcifications. No hyperdense vessel identified. Skull: Negative for fracture or focal lesion. Sinuses/Orbits: There is a small mucous retention cyst in the right frontal sinus  with mild mucosal thickening in the right maxillary sinus. The walls of the right frontal sinus are sclerotic, likely reactive. There is a stable small osteoma in the left ethmoid sinus. The visualized paranasal sinuses and mastoid air cells are otherwiseclear. No orbital abnormalities are seen. Other: None.  CT CERVICAL SPINE FINDINGS Alignment: Near anatomic. Skull base and vertebrae: No evidence of acute cervical spine fracture or traumatic subluxation. Multilevel endplate degenerative changes are present. Soft tissues and spinal canal: No prevertebral fluid or swelling. No visible canal hematoma. Disc levels: There is multilevel disc space narrowing and uncinate spurring, greatest from C4-5 through C6-7. Moderate facet degenerative changes are present throughout the cervical spine, greatest on the left at C7-T1. These factors contribute to mild-to-moderate foraminal narrowing at multiple levels. Upper chest: Unremarkable. Other: Bilateral carotid atherosclerosis noted.  IMPRESSION: 1. No acute intracranial or calvarial findings. 2. Progressive chronic small vessel ischemic changes in the periventricular and subcortical white matter. 3. No evidence of acute cervical spine fracture, traumatic subluxation or static signs of instability. 4. Cervical spondylosis as described.  Rory Percy, DO 03/04/2017, 6:54 AM PGY-1, [place] Intern pager: [phone], text pages welcome

## 2017-03-04 NOTE — Discharge Summary (Signed)
[facility] Discharge Summary  Patient name: Morgan Weaver Medical record number: [id number] Date of birth: 11-19-1928 Age: 83 y.o. Gender: female Date of Admission: 03/03/2017  Date of Discharge: 03/05/2017 Admitting Physician: Zenia Resides, MD  Primary Care Provider: Leonard Downing, MD Consultants: None  Indication for Hospitalization: Syncope  Discharge Diagnoses/Problem List:  Syncope Hypothyroidism, stable Dementia, stable  Disposition: Home  Discharge Condition: Stable  Discharge Exam:  General: lying comfortably in bed, in NAD, pleasant Cardiovascular: RRR, systolic murmur Respiratory: CTA bilaterally Abdomen: soft, non tender to palpation, not distended Extremities: WWP, dorsal pedal pulses appreciated. Neuro: alert, oriented to place and person.  Psych: mood and affect euthymic, appreciates care  Brief Hospital Course:  Morgan Weaver is a 83 y.o. female with PMH significant for hypothyroidism, dementia who presented after possible unwitnessed syncopal episode. Her son stated he returned home as she lives with him and found her on the floor lying on her right side with clear fluid near her, thought by her son to have been possible vomit. The patient does not recall the incident. In the ED, EKG showed sinus rhythm, U/A negative, CMP wnl. CBC notable for WBC 12. B12 wnl. CT head, neck no acute process. CXR negative. MRI head showed global atrophy, consistent with dementia, chronic microvascular changes, no acute process. ECHO EF 60-65% with mildly calcified aortic valve. Stroke work up negative. Episode likely due to mechanical fall as patient has shuffling gait, problems with balance at baseline, and history of falls. Patient remained neurologically intact throughout hospitalization and is stable for discharge with Home Health PT/OT.  Issues for Follow Up:  1. Consider counseling on advanced directives. Patient has durable POA, but no  current record of Healthcare POA, living will. 2. Patient is getting DME shower chair, please ensure patient has received this. 3. No medication changes this hospitalization.  Significant Procedures: None  Significant Labs and Imaging:   Recent Labs Lab 03/03/17 2235 03/04/17 0334 03/05/17 0402  WBC 9.7 7.5 8.4  HGB 12.9 11.6* 11.9*  HCT 38.2 34.6* 35.4*  PLT 220 179 171    Recent Labs Lab 03/03/17 1213 03/03/17 2235 03/04/17 0334 03/05/17 0402  NA 130*  --  129* 128*  K 4.4  --  3.8 4.3  CL 97*  --  97* 94*  CO2 21*  --  23 23  GLUCOSE 109*  --  86 88  BUN 15  --  17 22*  CREATININE 1.14* 1.24* 1.11* 1.17*  CALCIUM 9.5  --  8.9 9.0   MRI Brain 9/20 FINDINGS: Brain: Advanced atrophy with progression since 2014. Marked atrophy of the hippocampus bilaterally. Question dementia. Negative for hydrocephalus. Negative for acute infarct. Moderate chronic microvascular ischemic change throughout the cerebral white matter bilaterally. Negative for hemorrhage or mass. Vascular: Normal arterial flow voids. Skull and upper cervical spine: Negative Sinuses/Orbits: Mild mucosal edema right frontal sinus. Air-fluid level right sphenoid sinus. Other: None IMPRESSION: Advanced global atrophy. Severe atrophy of the hippocampus bilaterally suggestive of dementia. Moderate chronic microvascular ischemic change in the white matter. No acute abnormality.  ECHO 9/20 - Left ventricle: The cavity size was normal. Systolic function was normal. The estimated ejection fraction was in the range of 60% to 65%. Wall motion was normal; there were no regional wall motion abnormalities. Doppler parameters are consistent with abnormal left ventricular relaxation (grade 1 diastolic dysfunction). - Aortic valve: Mildly calcified annulus. Normal thickness leaflets  CXR 9/19 FINDINGS: Normal heart size. Stable  aortic tortuosity. There is no edema, consolidation, convincing  effusion, or pneumothorax. Artifact from EKG leads. No acute osseous finding. Levoscoliosis and osteopenia. IMPRESSION: No acute finding.  CT Head/Neck 9/19 FINDINGS Brain: There is no evidence of acute intracranial hemorrhage, mass lesion, brain edema or extra-axial fluid collection. There is stable mild generalized atrophy with prominence of the ventricles and subarachnoid spaces. There is confluent low-density in the periventricular and subcortical white matter consistent with chronic small vessel ischemic changes. These are mildly progressive. There is no CT evidence of acute cortical infarction. Vascular: Intracranial vascular calcifications. No hyperdense vessel identified. Skull: Negative for fracture or focal lesion. Sinuses/Orbits: There is a small mucous retention cyst in the right frontal sinus with mild mucosal thickening in the right maxillary sinus. The walls of the right frontal sinus are sclerotic, likely reactive. There is a stable small osteoma in the left ethmoid sinus. The visualized paranasal sinuses and mastoid air cells are otherwiseclear. No orbital abnormalities are seen. Other: None.  CT CERVICAL SPINE FINDINGS Alignment: Near anatomic. Skull base and vertebrae: No evidence of acute cervical spine fracture or traumatic subluxation. Multilevel endplate degenerative changes are present. Soft tissues and spinal canal: No prevertebral fluid or swelling. No visible canal hematoma. Disc levels: There is multilevel disc space narrowing and uncinate spurring, greatest from C4-5 through C6-7. Moderate facet degenerative changes are present throughout the cervical spine, greatest on the left at C7-T1. These factors contribute to mild-to-moderate foraminal narrowing at multiple levels. Upper chest: Unremarkable. Other: Bilateral carotid atherosclerosis noted.  IMPRESSION: 1. No acute intracranial or calvarial findings. 2. Progressive chronic small vessel ischemic changes in the periventricular  and subcortical white matter. 3. No evidence of acute cervical spine fracture, traumatic subluxation or static signs of instability. 4. Cervical spondylosis as described.  Results/Tests Pending at Time of Discharge: None  Discharge Medications:  Allergies as of 03/05/2017      Reactions   Exelon [rivastigmine Tartrate] Other (See Comments)   Asystole/sinus pauses/syncope   Aricept [donepezil Hcl] Nausea And Vomiting, Other (See Comments)   Irregular heartbeat also   Mobic [meloxicam] Other (See Comments)   Abdominal pain, GI ulcer (?)   Namenda [memantine Hcl] Nausea And Vomiting, Other (See Comments)   Irregular heartbeat also   Tetracyclines & Related Swelling   Lips swell   Tape Other (See Comments)   TAPE BRUISES THE SKIN (IT IS VERY THIN)   Ancef [cefazolin] Rash   Diffuse rash with urticaria, no respiratory involvement   Sulfa Drugs Cross Reactors Other (See Comments)   Reaction not recalled by the patient      Medication List    STOP taking these medications   cyanocobalamin 1000 MCG/ML injection Commonly known as:  (VITAMIN B-12)     TAKE these medications   GNP GINGKO BILOBA EXTRACT 60 MG Caps Generic drug:  Ginkgo Biloba Extract Take 60 mg by mouth daily as needed (for memory).   levothyroxine 125 MCG tablet Commonly known as:  SYNTHROID, LEVOTHROID Take 125 mcg by mouth daily before breakfast. What changed:  Another medication with the same name was removed. Continue taking this medication, and follow the directions you see here.   Tyrosine 500 MG Caps Take 500 mg by mouth daily.   VITAMIN D-3 PO Take 1 capsule by mouth daily.            Durable Medical Equipment        Start     Ordered   03/05/17 1341  DME tub  bench  Once     03/05/17 1341       Discharge Care Instructions        Start     Ordered   03/05/17 0000  Increase activity slowly     03/05/17 1341   03/05/17 0000  Diet - low sodium heart healthy     03/05/17 1341       Discharge Instructions: Please refer to Patient Instructions section of EMR for full details.  Patient was counseled important signs and symptoms that should prompt return to medical care, changes in medications, dietary instructions, activity restrictions, and follow up appointments.   Follow-Up Appointments: Follow up with primary doctor next week.  Rory Percy, DO 03/05/2017, 2:12 PM PGY-1, [place]

## 2017-03-05 DIAGNOSIS — R55 Syncope and collapse: Secondary | ICD-10-CM | POA: Diagnosis not present

## 2017-03-05 DIAGNOSIS — R001 Bradycardia, unspecified: Secondary | ICD-10-CM | POA: Diagnosis not present

## 2017-03-05 DIAGNOSIS — F0390 Unspecified dementia without behavioral disturbance: Secondary | ICD-10-CM | POA: Diagnosis not present

## 2017-03-05 LAB — BASIC METABOLIC PANEL
ANION GAP: 11 (ref 5–15)
BUN: 22 mg/dL — ABNORMAL HIGH (ref 6–20)
CALCIUM: 9 mg/dL (ref 8.9–10.3)
CO2: 23 mmol/L (ref 22–32)
Chloride: 94 mmol/L — ABNORMAL LOW (ref 101–111)
Creatinine, Ser: 1.17 mg/dL — ABNORMAL HIGH (ref 0.44–1.00)
GFR calc Af Amer: 47 mL/min — ABNORMAL LOW (ref >60)
GFR calc non Af Amer: 41 mL/min — ABNORMAL LOW (ref >60)
GLUCOSE: 88 mg/dL (ref 65–99)
Potassium: 4.3 mmol/L (ref 3.5–5.1)
Sodium: 128 mmol/L — ABNORMAL LOW (ref 135–145)

## 2017-03-05 LAB — FOLATE RBC
FOLATE, HEMOLYSATE: 582.1 ng/mL
Folate, RBC: 1481 ng/mL (ref >498)
HEMATOCRIT: 39.3 % (ref 34.0–46.6)

## 2017-03-05 LAB — GLUCOSE, CAPILLARY
GLUCOSE-CAPILLARY: 120 mg/dL — AB (ref 65–99)
Glucose-Capillary: 97 mg/dL (ref 65–99)
Glucose-Capillary: 97 mg/dL (ref 65–99)

## 2017-03-05 LAB — CBC
HEMATOCRIT: 35.4 % — AB (ref 36.0–46.0)
Hemoglobin: 11.9 g/dL — ABNORMAL LOW (ref 12.0–15.0)
MCH: 30.7 pg (ref 26.0–34.0)
MCHC: 33.6 g/dL (ref 30.0–36.0)
MCV: 91.5 fL (ref 78.0–100.0)
Platelets: 171 10*3/uL (ref 150–400)
RBC: 3.87 MIL/uL (ref 3.87–5.11)
RDW: 13.3 % (ref 11.5–15.5)
WBC: 8.4 10*3/uL (ref 4.0–10.5)

## 2017-03-05 NOTE — Evaluation (Signed)
Physical Therapy Evaluation Patient Details Name: Morgan Weaver MRN: [id number] DOB: [birth year] Today's Date: 03/05/2017   History of Present Illness  Morgan Weaver a 83 y.o.femalewith PMH significant for hypothyroidism, dementia who presented after possible unwitnessed syncopal episode. Her son stated he returned home as she lives with him and found her on the floor lying on her right side.  Clinical Impression  Pt is closer to baseline functioning, but still with weakness and mild balance issues.  She should be safe at home with intermittent assist/supervision from son/family, but needs some therapy to build strength, endurance and gait stability. Will sign off at this time in anticipation of discharge.     Follow Up Recommendations Home health PT;Supervision for mobility/OOB;Other (comment) (initially until she can recover strength and balance)    Equipment Recommendations  None recommended by PT    Recommendations for Other Services       Precautions / Restrictions Precautions Precautions: Fall Restrictions Weight Bearing Restrictions: No      Mobility  Bed Mobility               General bed mobility comments: pt OOB on arrival  Transfers Overall transfer level: Needs assistance Equipment used: 1 person hand held assist Transfers: Sit to/from Stand Sit to Stand: Min assist         General transfer comment: min A for steadying, pt using bil LE's against recliner for support  Ambulation/Gait Ambulation/Gait assistance: Min guard;Min assist Ambulation Distance (Feet): 200 Feet Assistive device: None Gait Pattern/deviations: Step-through pattern Gait velocity: slower, but able to increase speed noticeably Gait velocity interpretation: Below normal speed for age/gender General Gait Details: short guarded steps.  Definte episodes on mild instability without any other challenge than ambulation and mild worsening with scanning and directional  changes  Stairs Stairs: Yes Stairs assistance: Supervision Stair Management: One rail Right;Alternating pattern;Forwards Number of Stairs: 3 General stair comments: safe with rail  Wheelchair Mobility    Modified Rankin (Stroke Patients Only)       Balance Overall balance assessment: Needs assistance Sitting-balance support: No upper extremity supported;Feet supported Sitting balance-Leahy Scale: Good Sitting balance - Comments: sitting with no back support   Standing balance support: Single extremity supported;During functional activity;No upper extremity supported Standing balance-Leahy Scale: Fair Standing balance comment: static balance more steady than dynamic                             Pertinent Vitals/Pain Pain Assessment: No/denies pain    Home Living Family/patient expects to be discharged to:: Private residence Living Arrangements: Children (son - adult) Available Help at Discharge: Family;Available PRN/intermittently Type of Home: House Home Access: Stairs to enter Entrance Stairs-Rails: Right;Left (too far apart to reach both at the same time) Entrance Stairs-Number of Steps: 3 Home Layout: One level Home Equipment: None      Prior Function Level of Independence: Independent         Comments: cooks, cleans     Hand Dominance   Dominant Hand: Right    Extremity/Trunk Assessment   Upper Extremity Assessment Upper Extremity Assessment: Generalized weakness    Lower Extremity Assessment Lower Extremity Assessment: Overall WFL for tasks assessed (left hip flexor, hams and abdominal weakness)    Cervical / Trunk Assessment Cervical / Trunk Assessment: Kyphotic  Communication   Communication: No difficulties  Cognition Arousal/Alertness: Awake/alert Behavior During Therapy: WFL for tasks assessed/performed Overall Cognitive Status: Within Functional Limits for  tasks assessed                                  General Comments: Hx of dementia, mostly WFL, but got a little off on present season.      General Comments General comments (skin integrity, edema, etc.): pt talks about getting out and doing yardwork and chores that she usually does, but pt would be at risk for falls doing these moderate activities at present in my opinion.    Exercises     Assessment/Plan    PT Assessment All further PT needs can be met in the next venue of care  PT Problem List Decreased strength;Decreased activity tolerance;Decreased balance;Decreased mobility       PT Treatment Interventions      PT Goals (Current goals can be found in the Care Plan section)  Acute Rehab PT Goals Patient Stated Goal: to get home PT Goal Formulation: With patient    Frequency     Barriers to discharge        Co-evaluation               AM-PAC PT "6 Clicks" Daily Activity  Outcome Measure Difficulty turning over in bed (including adjusting bedclothes, sheets and blankets)?: A Little Difficulty moving from lying on back to sitting on the side of the bed? : A Little Difficulty sitting down on and standing up from a chair with arms (e.g., wheelchair, bedside commode, etc,.)?: A Little Help needed moving to and from a bed to chair (including a wheelchair)?: A Little Help needed walking in hospital room?: A Little Help needed climbing 3-5 steps with a railing? : A Little 6 Click Score: 18    End of Session   Activity Tolerance: Patient tolerated treatment well Patient left: in chair;with call bell/phone within reach Nurse Communication: Mobility status PT Visit Diagnosis: Unsteadiness on feet (R26.81);Muscle weakness (generalized) (M62.81)    Time: 3643-8377 PT Time Calculation (min) (ACUTE ONLY): 21 min   Charges:   PT Evaluation $PT Eval Moderate Complexity: 1 Mod     PT G Codes:   PT G-Codes **NOT FOR INPATIENT CLASS** Functional Assessment Tool Used: AM-PAC 6 Clicks Basic Mobility;Clinical  judgement Functional Limitation: Mobility: Walking and moving around Mobility: Walking and Moving Around Current Status (P3968): At least 1 percent but less than 20 percent impaired, limited or restricted Mobility: Walking and Moving Around Goal Status [phone]): At least 1 percent but less than 20 percent impaired, limited or restricted Mobility: Walking and Moving Around Discharge Status [phone]): At least 1 percent but less than 20 percent impaired, limited or restricted    03/05/2017  Donnella Sham, PT [phone]  (pager)  Tessie Fass Loren Sawaya 03/05/2017, 12:38 PM

## 2017-03-05 NOTE — Progress Notes (Addendum)
Attempted to call son for discharge, no answer, will try again.  2:25 pm- Son will come by as soon as he can.

## 2017-03-05 NOTE — Discharge Instructions (Signed)
You were hospitalized for a concern for a passing out spell. Because this episode was unwitnessed we did an evaluation for possible stroke and all of your tests and imaging came back negative for any stroke process. We think this was likely due to an unwitnessed fall and are recommending Home Health Physical therapy.  Follow up with your primary doctor next week.

## 2017-03-05 NOTE — Evaluation (Signed)
Occupational Therapy Evaluation Patient Details Name: Morgan Weaver MRN: [id number] DOB: 03/07/1929 Today's Date: 03/05/2017    History of Present Illness Morgan Weaver a 83 y.o.femalewith PMH significant for hypothyroidism, dementia who presented after possible unwitnessed syncopal episode. Her son stated he returned home as she lives with him and found her on the floor lying on her right side.   Clinical Impression   No family present to determine if cognition is back to baseline. Pt was able to respond appropriately and seemed [facility] this session. PTA pt reports she was independent in ADL and mobility (no DME) and was still cooking, cleaning. Pt is currently min guard for ADL and min guard for mobility in room without DME. Pt has no further acute OT needs but she will beenfit from [facility] for falls assessment of home environment as well as therapy in the home to maintain her independence and safety during ADL and functional transfers. OT recommending shower chair, and want to reinforce safety and fall prevention in the bathroom as Pt has a history of falls. Pt will require 24 hour supervision as she presents as a high fall risk.     Follow Up Recommendations  Home health OT;Supervision/Assistance - 24 hour    Equipment Recommendations  Tub/shower seat    Recommendations for Other Services       Precautions / Restrictions Precautions Precautions: Fall Restrictions Weight Bearing Restrictions: No      Mobility Bed Mobility               General bed mobility comments: Pt OOB with NT when OT enetered the room  Transfers Overall transfer level: Needs assistance Equipment used: 1 person hand held assist Transfers: Sit to/from Stand Sit to Stand: Min assist         General transfer comment: min A for steadying    Balance Overall balance assessment: Needs assistance Sitting-balance support: No upper extremity supported;Feet supported Sitting balance-Leahy Scale:  Good Sitting balance - Comments: sitting with no back support   Standing balance support: Single extremity supported;During functional activity;No upper extremity supported Standing balance-Leahy Scale: Fair Standing balance comment: able to static stand at sink for ADL, needs close min guard for dynamic movement                           ADL either performed or assessed with clinical judgement   ADL Overall ADL's : Needs assistance/impaired Eating/Feeding: Modified independent;Sitting   Grooming: Wash/dry hands;Oral care;Wash/dry face;Supervision/safety;Standing Grooming Details (indicate cue type and reason): sink level Upper Body Bathing: Supervision/ safety;Sitting   Lower Body Bathing: Supervison/ safety;Sitting/lateral leans   Upper Body Dressing : Set up;Sitting   Lower Body Dressing: Supervision/safety;Sit to/from stand Lower Body Dressing Details (indicate cue type and reason): to don socks Toilet Transfer: Min guard;Ambulation;Minimal assistance;Regular Toilet;Grab bars Toilet Transfer Details (indicate cue type and reason): initially HHA for balance, progressed to min guard Toileting- Clothing Manipulation and Hygiene: Supervision/safety;Sit to/from stand Toileting - Clothing Manipulation Details (indicate cue type and reason): front peri care Tub/ Shower Transfer: Walk-in shower;Min guard;Ambulation Tub/Shower Transfer Details (indicate cue type and reason): educated Pt on benefits and safety of sitting during showering Functional mobility during ADLs: Minimal assistance;Min guard       Vision Baseline Vision/History: Wears glasses Wears Glasses: Reading only Vision Assessment?: No apparent visual deficits     Perception     Praxis      Pertinent Vitals/Pain Pain Assessment: No/denies pain  Hand Dominance Right   Extremity/Trunk Assessment Upper Extremity Assessment Upper Extremity Assessment: Generalized weakness   Lower Extremity  Assessment Lower Extremity Assessment: Defer to PT evaluation   Cervical / Trunk Assessment Cervical / Trunk Assessment: Kyphotic   Communication Communication Communication: No difficulties   Cognition Arousal/Alertness: Awake/alert Behavior During Therapy: WFL for tasks assessed/performed Overall Cognitive Status: Within Functional Limits for tasks assessed                                 General Comments: history of dementia, WFL during session   General Comments  no family present to determine baseline    Exercises     Shoulder Instructions      Home Living Family/patient expects to be discharged to:: Private residence Living Arrangements: Children (son - adult) Available Help at Discharge: Family;Available PRN/intermittently Type of Home: House Home Access: Stairs to enter [employer] of Steps: 3 Entrance Stairs-Rails: Right;Left (too far apart to reach both at the same time) Home Layout: One level     Bathroom Shower/Tub: Occupational psychologist: Standard     Home Equipment: None          Prior Functioning/Environment Level of Independence: Independent        Comments: cooks, cleans        OT Problem List: Impaired balance (sitting and/or standing);Decreased safety awareness;Decreased knowledge of use of DME or AE      OT Treatment/Interventions:      OT Goals(Current goals can be found in the care plan section) Acute Rehab OT Goals Patient Stated Goal: to get home OT Goal Formulation: With patient Time For Goal Achievement: 03/19/17 Potential to Achieve Goals: Good  OT Frequency:     Barriers to D/C:            Co-evaluation              AM-PAC PT "6 Clicks" Daily Activity     Outcome Measure Help from another person eating meals?: None Help from another person taking care of personal grooming?: A Little Help from another person toileting, which includes using toliet, bedpan, or urinal?: A  Little Help from another person bathing (including washing, rinsing, drying)?: A Little Help from another person to put on and taking off regular upper body clothing?: None Help from another person to put on and taking off regular lower body clothing?: A Little 6 Click Score: 20   End of Session Equipment Utilized During Treatment: Gait belt Nurse Communication: Mobility status  Activity Tolerance: Patient tolerated treatment well Patient left: in chair;with call bell/phone within reach;with family/visitor present  OT Visit Diagnosis: Unsteadiness on feet (R26.81);Other abnormalities of gait and mobility (R26.89);Muscle weakness (generalized) (M62.81)                Time: 1001-1029 OT Time Calculation (min): 28 min Charges:  OT General Charges $OT Visit: 1 Visit OT Evaluation $OT Eval Moderate Complexity: 1 Mod OT Treatments $Self Care/Home Management : 8-22 mins G-Codes: OT G-codes **NOT FOR INPATIENT CLASS** Functional Assessment Tool Used: AM-PAC 6 Clicks Daily Activity Functional Limitation: Self care Self Care Current Status (Z6109): At least 20 percent but less than 40 percent impaired, limited or restricted Self Care Goal Status (U0454): At least 1 percent but less than 20 percent impaired, limited or restricted Self Care Discharge Status [phone]): At least 20 percent but less than 40 percent impaired, limited or restricted  Hulda Humphrey OTR/L [place] 03/05/2017, 10:44 AM

## 2017-03-05 NOTE — Care Management Obs Status (Signed)
[place] NOTIFICATION   Patient Details  Name: Morgan Weaver MRN: [id number] Date of Birth: [birth year]   Medicare Observation Status Notification Given:  Yes    Sharin Mons, RN 03/05/2017, 2:27 PM

## 2017-03-05 NOTE — Progress Notes (Signed)
Family Medicine Teaching Service Daily Progress Note Intern Pager: [phone]  Patient name: Morgan Weaver Medical record number: [id number] Date of birth: 01-Feb-1929 Age: 83 y.o. Gender: female  Primary Care Provider: Leonard Downing, MD Consultants: None Code Status: Full  Pt Overview and Major Events to Date:  9/19 - admitted for workup of syncope  Assessment and Plan: Morgan Weaver is a 83 y.o. female presenting with possible syncopal episode. PMH is significant for hypothyroidism, dementia.  Possible syncopal episode Episode was not witnessed and ddx include TIA vs. Orthostatic hypotension vs mechanical fall vs cardiac. Patient has shuffling gait and is off balance at baseline per son, has a history of falls and is likely the cause of her current episode. Also as history of B12 deficiency. Stroke process, TIA unlikely as no neurological deficits were noted, also no seizure activity witnessed.  CT head, neck no acute process. CXR negative. EKG sinus rhythm but difficult to read with artifact. Repeat EKG sinus rhythm. U/A negative but does show ketones, therefore she may be a little dehydrated. CMP wnl. CBC notable for WBC 12 >> 8.4. B12 wnl. Trop neg x3. Negative orthostatic vitals. MRI brain global atrophy c/w dementia, chronic microvascular changes, no acute process. ECHO EF 60-65%, mildly calcified aortic valve. Episode likely due to mechanical fall, dispo pending PT eval. - folate level - cardiac monitoring - PT/OT  Hypothyroidism Home med: Synthroid 13mcg. TSH 2.627 - continue home Synthroid  HTN Remote history of hypertension, not medically managed. 137/68 on admission. - monitor  FEN/GI: regular diet Prophylaxis: Lovenox  Disposition: pending PT evaluation  Subjective:  Feeling much better today, feels "less crazy" than yesterday. Denies CP, SOB, lightheadedness, dizziness.  Objective: Temp:  [97.5 F (36.4 C)-98.9 F (37.2 C)] 97.6 F (36.4 C) (09/21  0603) Pulse Rate:  [51-60] 53 (09/21 0603) Resp:  [16-20] 16 (09/21 0603) BP: (109-148)/(51-68) 148/67 (09/21 0603) SpO2:  [93 %-98 %] 98 % (09/21 0603) Weight:  [115 lb 11.2 oz (52.5 kg)] 115 lb 11.2 oz (52.5 kg) (09/21 0500)  Physical Exam: General: lying comfortably in bed, in NAD, pleasant Cardiovascular: RRR, systolic murmur Respiratory: CTA bilaterally Abdomen: soft, non tender to palpation, not distended Extremities: WWP, dorsal pedal pulses appreciated. Neuro: alert, oriented to place and person.  Psych: mood and affect euthymic, appreciates care  Laboratory:  Recent Labs Lab 03/03/17 2235 03/04/17 0334 03/05/17 0402  WBC 9.7 7.5 8.4  HGB 12.9 11.6* 11.9*  HCT 38.2 34.6* 35.4*  PLT 220 179 171    Recent Labs Lab 03/03/17 1213 03/03/17 2235 03/04/17 0334 03/05/17 0402  NA 130*  --  129* 128*  K 4.4  --  3.8 4.3  CL 97*  --  97* 94*  CO2 21*  --  23 23  BUN 15  --  17 22*  CREATININE 1.14* 1.24* 1.11* 1.17*  CALCIUM 9.5  --  8.9 9.0  GLUCOSE 109*  --  86 88   EKG - sinus rhythm  Imaging/Diagnostic Tests:  ECHO 9/20 - Left ventricle: The cavity size was normal. Systolic function was   normal. The estimated ejection fraction was in the range of 60%   to 65%. Wall motion was normal; there were no regional wall   motion abnormalities. Doppler parameters are consistent with   abnormal left ventricular relaxation (grade 1 diastolic   dysfunction). - Aortic valve: Mildly calcified annulus. Normal thickness   leaflets  CXR 9/19 FINDINGS: Normal heart size. Stable aortic tortuosity. There is  no edema, consolidation, convincing effusion, or pneumothorax. Artifact from EKG leads. No acute osseous finding. Levoscoliosis and osteopenia. IMPRESSION: No acute finding.  CT Head/Neck 9/19 FINDINGS Brain: There is no evidence of acute intracranial hemorrhage, mass lesion, brain edema or extra-axial fluid collection. There is stable mild generalized atrophy with  prominence of the ventricles and subarachnoid spaces. There is confluent low-density in the periventricular and subcortical white matter consistent with chronic small vessel ischemic changes. These are mildly progressive. There is no CT evidence of acute cortical infarction. Vascular: Intracranial vascular calcifications. No hyperdense vessel identified. Skull: Negative for fracture or focal lesion. Sinuses/Orbits: There is a small mucous retention cyst in the right frontal sinus with mild mucosal thickening in the right maxillary sinus. The walls of the right frontal sinus are sclerotic, likely reactive. There is a stable small osteoma in the left ethmoid sinus. The visualized paranasal sinuses and mastoid air cells are otherwiseclear. No orbital abnormalities are seen. Other: None.  CT CERVICAL SPINE FINDINGS Alignment: Near anatomic. Skull base and vertebrae: No evidence of acute cervical spine fracture or traumatic subluxation. Multilevel endplate degenerative changes are present. Soft tissues and spinal canal: No prevertebral fluid or swelling. No visible canal hematoma. Disc levels: There is multilevel disc space narrowing and uncinate spurring, greatest from C4-5 through C6-7. Moderate facet degenerative changes are present throughout the cervical spine, greatest on the left at C7-T1. These factors contribute to mild-to-moderate foraminal narrowing at multiple levels. Upper chest: Unremarkable. Other: Bilateral carotid atherosclerosis noted.  IMPRESSION: 1. No acute intracranial or calvarial findings. 2. Progressive chronic small vessel ischemic changes in the periventricular and subcortical white matter. 3. No evidence of acute cervical spine fracture, traumatic subluxation or static signs of instability. 4. Cervical spondylosis as described.  Rory Percy, DO 03/05/2017, 7:05 AM PGY-1, [facility] Intern pager: [phone], text pages welcome

## 2017-06-23 ENCOUNTER — Telehealth: Payer: Self-pay

## 2017-06-23 NOTE — Telephone Encounter (Signed)
Sent referral to scheduling 

## 2017-07-07 ENCOUNTER — Ambulatory Visit: Payer: Medicare Other | Admitting: Internal Medicine

## 2017-07-07 ENCOUNTER — Encounter: Payer: Self-pay | Admitting: Internal Medicine

## 2017-07-07 ENCOUNTER — Encounter (INDEPENDENT_AMBULATORY_CARE_PROVIDER_SITE_OTHER): Payer: Self-pay

## 2017-07-07 VITALS — BP 120/68 | HR 79 | Ht 66.0 in | Wt 141.0 lb

## 2017-07-07 DIAGNOSIS — I480 Paroxysmal atrial fibrillation: Secondary | ICD-10-CM

## 2017-07-07 DIAGNOSIS — I493 Ventricular premature depolarization: Secondary | ICD-10-CM | POA: Diagnosis not present

## 2017-07-07 NOTE — Progress Notes (Signed)
PCP: Leonard Downing, MD   Primary EP: Dr Rayann Heman  Morgan Weaver is a 84 y.o. female who presents today for electrophysiology followup.  Since last being seen in our clinic, the patient reports doing reasonably well.  She lives with her son and mostly watches tv.  She is not very stead on her feet.  She is not very active.  She was found down in the kitchen twice recently.  These were unobserved events.  She is unable to provide history due to advanced dementia.  She is unsteady with walking typically.  She was evaluated at [facility] without abnormal findings observed.  Today, she denies symptoms of palpitations, chest pain, shortness of breath,  lower extremity edema, or dizziness..  The patient is otherwise without complaint today.   Past Medical History:  Diagnosis Date  . Arthritis   . Hemorrhoid   . Hyperlipidemia   . Hypertension   . Lyme disease   . Ovarian cyst   . Thyroid disease    hypothyroidism  . Torn rotator cuff   . Weight decrease    Past Surgical History:  Procedure Laterality Date  . ABDOMINAL HYSTERECTOMY  1970  . APPENDECTOMY  1952  . HIP ARTHROPLASTY Left 03/16/2013   bipolar        Dr Wendee Beavers  . HIP ARTHROPLASTY Left 03/17/2013   Procedure: ARTHROPLASTY BIPOLAR HIP;  Surgeon: Augustin Schooling, MD;  Location: [facility];  Service: Orthopedics;  Laterality: Left;  . KNEE ARTHROSCOPY    . OOPHORECTOMY  1955   removed due to the ovary having gangrene  . OTHER SURGICAL HISTORY     left foot surgery  . OTHER SURGICAL HISTORY     right foot surgery  . [place]   left  . TONSILLECTOMY      ROS- all systems are reviewed and negatives except as per HPI above  Current Outpatient Medications  Medication Sig Dispense Refill  . Cholecalciferol (VITAMIN D-3 PO) Take 1 capsule by mouth daily.    . Ginkgo Biloba Extract (GNP GINGKO BILOBA EXTRACT) 60 MG CAPS Take 60 mg by mouth daily as needed (for memory).     Marland Kitchen levothyroxine (SYNTHROID,  LEVOTHROID) 125 MCG tablet Take 125 mcg by mouth daily before breakfast.  2  . Tyrosine 500 MG CAPS Take 500 mg by mouth daily.     No current facility-administered medications for this visit.     Physical Exam: Vitals:   07/07/17 1429  BP: 120/68  Pulse: 79  SpO2: 97%  Weight: 141 lb (64 kg)  Height: 5\' 6"  (1.676 m)    GEN- The patient is elderly appearing, alert but with poor memory Head- normocephalic, atraumatic Eyes-  Sclera clear, conjunctiva pink Ears- hearing intact Oropharynx- clear Lungs- Clear to ausculation bilaterally, normal work of breathing Heart- Regular rate and rhythm, no murmurs, rubs or gallops, PMI not laterally displaced GI- soft, NT, ND, + BS Extremities- no clubbing, cyanosis, or edema  EKG tracing ordered today is personally reviewed and shows sinus rhythm 68 bpm, nonspecific St/T changes, poor R wave progression  Records from primary care including event monitor are reviewed.  Her event monitor reveals sinus rhythm with PVCs, no AV block, pauses, or arrhythmias are observed.  Her son reports that he had difficulty getting her to keep the monitor on  Assessment and Plan:  1. ? Syncope The patient has been found down twice.  There is uncertainty as to whether she  simply fell or had presyncope/ syncope.  She has been evaluated and her workup has been uneventful.  I had a long discussion today with her son.  We discussed implantable loop recorder placement as an option.  We also discussed conservative management.  He would prefer conservative management at this time.  Given her advanced age and dementia, I think that this is best.  2. PVCs Observed on recent event monitor Asymptomatic Preserved EF by recent echo (reviewed) No medical management or further workup is advised  3. afib She has a h/o afib I do not see recent episodes Poor candidate for anticoagulation at this time  4. Prior sick sinus Previously resolved off of exelon.  No further  episodes documented  I will see as needed going forward  Thompson Grayer MD, [facility] 07/07/2017 2:57 PM      +

## 2017-07-07 NOTE — Patient Instructions (Signed)
Medication Instructions:  Your physician recommends that you continue on your current medications as directed. Please refer to the Current Medication list given to you today.  Labwork: None ordered.  Testing/Procedures: None ordered.  Follow-Up: Your physician recommends that you schedule a follow-up appointment As Needed.    Any Other Special Instructions Will Be Listed Below (If Applicable).     If you need a refill on your cardiac medications before your next appointment, please call your pharmacy.

## 2017-08-03 ENCOUNTER — Emergency Department (HOSPITAL_COMMUNITY): Payer: Medicare Other

## 2017-08-03 ENCOUNTER — Observation Stay (HOSPITAL_COMMUNITY)
Admission: EM | Admit: 2017-08-03 | Discharge: 2017-08-04 | Disposition: A | Discharge status: Home / Self Care | Payer: Medicare Other | Attending: Student | Admitting: Student

## 2017-08-03 ENCOUNTER — Encounter (HOSPITAL_COMMUNITY): Payer: Self-pay | Admitting: Emergency Medicine

## 2017-08-03 DIAGNOSIS — Z79899 Other long term (current) drug therapy: Secondary | ICD-10-CM | POA: Insufficient documentation

## 2017-08-03 DIAGNOSIS — E039 Hypothyroidism, unspecified: Secondary | ICD-10-CM | POA: Diagnosis not present

## 2017-08-03 DIAGNOSIS — Z87891 Personal history of nicotine dependence: Secondary | ICD-10-CM | POA: Insufficient documentation

## 2017-08-03 DIAGNOSIS — I4891 Unspecified atrial fibrillation: Secondary | ICD-10-CM | POA: Insufficient documentation

## 2017-08-03 DIAGNOSIS — Z96642 Presence of left artificial hip joint: Secondary | ICD-10-CM | POA: Insufficient documentation

## 2017-08-03 DIAGNOSIS — F0390 Unspecified dementia without behavioral disturbance: Secondary | ICD-10-CM | POA: Diagnosis not present

## 2017-08-03 DIAGNOSIS — L509 Urticaria, unspecified: Secondary | ICD-10-CM | POA: Diagnosis present

## 2017-08-03 DIAGNOSIS — R55 Syncope and collapse: Principal | ICD-10-CM | POA: Diagnosis present

## 2017-08-03 DIAGNOSIS — I10 Essential (primary) hypertension: Secondary | ICD-10-CM | POA: Diagnosis not present

## 2017-08-03 DIAGNOSIS — R509 Fever, unspecified: Secondary | ICD-10-CM | POA: Diagnosis not present

## 2017-08-03 LAB — INFLUENZA PANEL BY PCR (TYPE A & B)
INFLBPCR: NEGATIVE
Influenza A By PCR: NEGATIVE

## 2017-08-03 LAB — CBC WITH DIFFERENTIAL/PLATELET
BASOS PCT: 0 %
Basophils Absolute: 0 10*3/uL (ref 0.0–0.1)
EOS PCT: 0 %
Eosinophils Absolute: 0 10*3/uL (ref 0.0–0.7)
HCT: 40 % (ref 36.0–46.0)
Hemoglobin: 13.5 g/dL (ref 12.0–15.0)
LYMPHS PCT: 11 %
Lymphs Abs: 0.9 10*3/uL (ref 0.7–4.0)
MCH: 31.4 pg (ref 26.0–34.0)
MCHC: 33.8 g/dL (ref 30.0–36.0)
MCV: 93 fL (ref 78.0–100.0)
Monocytes Absolute: 0.9 10*3/uL (ref 0.1–1.0)
Monocytes Relative: 10 %
NEUTROS ABS: 6.6 10*3/uL (ref 1.7–7.7)
Neutrophils Relative %: 79 %
PLATELETS: 169 10*3/uL (ref 150–400)
RBC: 4.3 MIL/uL (ref 3.87–5.11)
RDW: 13.9 % (ref 11.5–15.5)
WBC: 8.4 10*3/uL (ref 4.0–10.5)

## 2017-08-03 LAB — COMPREHENSIVE METABOLIC PANEL
ALK PHOS: 50 U/L (ref 38–126)
ALT: 9 U/L — ABNORMAL LOW (ref 14–54)
ANION GAP: 12 (ref 5–15)
AST: 33 U/L (ref 15–41)
Albumin: 3.6 g/dL (ref 3.5–5.0)
BILIRUBIN TOTAL: 2.1 mg/dL — AB (ref 0.3–1.2)
BUN: 17 mg/dL (ref 6–20)
CALCIUM: 8.6 mg/dL — AB (ref 8.9–10.3)
CO2: 22 mmol/L (ref 22–32)
Chloride: 99 mmol/L — ABNORMAL LOW (ref 101–111)
Creatinine, Ser: 1.39 mg/dL — ABNORMAL HIGH (ref 0.44–1.00)
GFR calc Af Amer: 39 mL/min — ABNORMAL LOW (ref >60)
GFR, EST NON AFRICAN AMERICAN: 33 mL/min — AB (ref >60)
Glucose, Bld: 108 mg/dL — ABNORMAL HIGH (ref 65–99)
POTASSIUM: 4.4 mmol/L (ref 3.5–5.1)
Sodium: 133 mmol/L — ABNORMAL LOW (ref 135–145)
Total Protein: 5.8 g/dL — ABNORMAL LOW (ref 6.5–8.1)

## 2017-08-03 LAB — RESPIRATORY PANEL BY PCR

## 2017-08-03 LAB — BILIRUBIN, FRACTIONATED(TOT/DIR/INDIR)
Bilirubin, Direct: 0.2 mg/dL (ref 0.1–0.5)
Indirect Bilirubin: 1.3 mg/dL — ABNORMAL HIGH (ref 0.3–0.9)
Total Bilirubin: 1.5 mg/dL — ABNORMAL HIGH (ref 0.3–1.2)

## 2017-08-03 LAB — I-STAT CG4 LACTIC ACID, ED: Lactic Acid, Venous: 1.82 mmol/L (ref 0.5–1.9)

## 2017-08-03 MED ORDER — TRIAMCINOLONE ACETONIDE 0.1 % EX CREA
TOPICAL_CREAM | Freq: Two times a day (BID) | CUTANEOUS | Status: DC
  Filled 2017-08-03 (×2): qty 15

## 2017-08-03 MED ORDER — SODIUM CHLORIDE 0.9 % IV SOLN
INTRAVENOUS | Status: AC
  Administered 2017-08-03 – 2017-08-04 (×2): via INTRAVENOUS

## 2017-08-03 MED ORDER — TRIAMCINOLONE ACETONIDE 0.1 % EX CREA
TOPICAL_CREAM | Freq: Two times a day (BID) | CUTANEOUS | Status: DC
  Administered 2017-08-03 – 2017-08-04 (×2): via TOPICAL
  Filled 2017-08-03: qty 15

## 2017-08-03 MED ORDER — DIPHENHYDRAMINE HCL 25 MG PO CAPS: 25.0000 mg | ORAL_CAPSULE | Freq: Four times a day (QID) | ORAL | Status: DC | PRN

## 2017-08-03 MED ORDER — ACETAMINOPHEN 500 MG PO TABS
1000.0000 mg | ORAL_TABLET | Freq: Once | ORAL | Status: AC
  Administered 2017-08-03: 1000 mg via ORAL
  Filled 2017-08-03: qty 2

## 2017-08-03 MED ORDER — HYDROCERIN EX CREA
TOPICAL_CREAM | CUTANEOUS | Status: DC
  Administered 2017-08-03 – 2017-08-04 (×3): via TOPICAL
  Filled 2017-08-03: qty 113

## 2017-08-03 MED ORDER — ENOXAPARIN SODIUM 30 MG/0.3ML ~~LOC~~ SOLN
30.0000 mg | SUBCUTANEOUS | Status: DC
  Administered 2017-08-03: 30 mg via SUBCUTANEOUS
  Filled 2017-08-03: qty 0.3

## 2017-08-03 MED ORDER — ACETAMINOPHEN 325 MG PO TABS: 650.0000 mg | ORAL_TABLET | Freq: Four times a day (QID) | ORAL | Status: DC | PRN

## 2017-08-03 MED ORDER — ACETAMINOPHEN 650 MG RE SUPP: 650.0000 mg | Freq: Four times a day (QID) | RECTAL | Status: DC | PRN

## 2017-08-03 MED ORDER — SODIUM CHLORIDE 0.9 % IV BOLUS (SEPSIS)
1000.0000 mL | Freq: Once | INTRAVENOUS | Status: AC
  Administered 2017-08-03: 1000 mL via INTRAVENOUS

## 2017-08-03 MED ORDER — LEVOTHYROXINE SODIUM 25 MCG PO TABS
125.0000 ug | ORAL_TABLET | Freq: Every day | ORAL | Status: DC
  Administered 2017-08-04: 125 ug via ORAL
  Filled 2017-08-03: qty 1

## 2017-08-03 MED ORDER — HYDROCERIN EX CREA
TOPICAL_CREAM | CUTANEOUS | Status: DC
  Filled 2017-08-03: qty 113

## 2017-08-03 NOTE — ED Provider Notes (Signed)
[facility] EMERGENCY DEPARTMENT Provider Note   CSN: [id number] Arrival date & time: 08/03/17  1247     History   Chief Complaint Chief Complaint  Patient presents with  . Loss of Consciousness    HPI Morgan Weaver is a 84 y.o. female.  HPI 84 year old Caucasian female past medical history significant for A. fib with RVR not currently anticoagulated due to fall risk, sick sinus syndrome, advanced dementia, hypertension, thyroid disease presents to the emergency department today for evaluation of syncopal episode.  According to family at bedside patient had 3 episodes of urinary incontinence yesterday.  There was concerned that she may have a urinary tract infection as she was not acting at her baseline.  Patient does have advanced dementia however they state that she seemed to be more altered than usual.  He states that this morning patient was in her chair trying to get dressed when the son went to find her in the room and she was slumped over and was unconscious.  Woke her up and states that this was for a brief second.  Family does report a cough that started yesterday.  Denies any known fevers. They do state that she was having chills yesterday.  Patient does have a history of syncopal episode is followed by cardiology.  Denies any associated abdominal pain, nausea, emesis, change in bowel habits.  Patient denies any complaints at this time however patient has advanced dementia and hence at baseline per family.  Family also makes note of a rash that they noticed today.  Patient complains of itchy urticarial rash.  Patient with history of same.  No new soaps, laundry detergents, lotions, medicines.  Denies any difficulty breathing or swallowing.   Past Medical History:  Diagnosis Date  . Arthritis   . Hemorrhoid   . Hyperlipidemia   . Hypertension   . Lyme disease   . Ovarian cyst   . Thyroid disease    hypothyroidism  . Torn rotator cuff   . Weight decrease      Patient Active Problem List   Diagnosis Date Noted  . Sinus bradycardia   . Syncope 03/03/2017  . Atrial fibrillation with RVR ([place])   . Heart block   . Transient hypotension   . Paroxysmal atrial fibrillation ([facility])   . Sinus pause   . Hypokalemia   . Dementia 02/15/2015  . Memory loss 08/01/2014  . B12 deficiency 08/01/2014  . Hyponatremia 03/18/2013  . Closed left hip fracture ([place]) 03/16/2013  . Thrombocytopenia, unspecified ([place]) 11/17/2012  . Leukopenia 11/17/2012  . Syncope and collapse 11/16/2012  . Acute UTI 11/16/2012  . Hypertension 11/16/2012  . Hypothyroid 11/16/2012  . Rash 11/16/2012  . Pruritus ani 04/16/2011    Past Surgical History:  Procedure Laterality Date  . ABDOMINAL HYSTERECTOMY  1970  . APPENDECTOMY  1952  . HIP ARTHROPLASTY Left 03/16/2013   bipolar        Dr Wendee Beavers  . HIP ARTHROPLASTY Left 03/17/2013   Procedure: ARTHROPLASTY BIPOLAR HIP;  Surgeon: Augustin Schooling, MD;  Location: [facility];  Service: Orthopedics;  Laterality: Left;  . KNEE ARTHROSCOPY    . OOPHORECTOMY  1955   removed due to the ovary having gangrene  . OTHER SURGICAL HISTORY     left foot surgery  . OTHER SURGICAL HISTORY     right foot surgery  . [place]   left  . TONSILLECTOMY      OB History  No data available       Home Medications    Prior to Admission medications   Medication Sig Start Date End Date Taking? Authorizing Provider  Cholecalciferol (VITAMIN D-3 PO) Take 1 capsule by mouth daily.    [provider]  Ginkgo Biloba Extract (GNP GINGKO BILOBA EXTRACT) 60 MG CAPS Take 60 mg by mouth daily as needed (for memory).     [provider]  levothyroxine (SYNTHROID, LEVOTHROID) 125 MCG tablet Take 125 mcg by mouth daily before breakfast. 12/03/16   [provider]  Tyrosine 500 MG CAPS Take 500 mg by mouth daily.    [provider]    Family History Family History  Problem Relation Age of Onset  .  Heart disease Father        heart attack  . Hypertension Father   . Other Son        brain tumor     Social History Social History   Tobacco Use  . Smoking status: Former Research scientist (life sciences)  . Smokeless tobacco: Never Used  . Tobacco comment: quit-1977  Substance Use Topics  . Alcohol use: Yes    Alcohol/week: 8.4 oz    Types: 14 Glasses of wine per week    Comment: BEFORE DINNER  . Drug use: No     Allergies   Exelon [rivastigmine tartrate]; Aricept [donepezil hcl]; Mobic [meloxicam]; Namenda [memantine hcl]; Tetracyclines & related; Tape; Ancef [cefazolin]; and Sulfa drugs cross reactors   Review of Systems Review of Systems  Constitutional: Positive for chills and fever.  HENT: Negative for congestion.   Respiratory: Positive for cough. Negative for shortness of breath.   Cardiovascular: Negative for chest pain.  Gastrointestinal: Negative for abdominal pain, diarrhea, nausea and vomiting.  Genitourinary:       Incontinent of urine with foul smelling urine  Skin: Positive for rash.  Neurological: Negative for weakness and numbness.     Physical Exam Updated Vital Signs BP 107/87   Pulse 69   Temp (!) 101.3 F (38.5 C) (Rectal)   Resp 17   SpO2 100%   Physical Exam  Constitutional: She is oriented to person, place, and time. She appears well-developed and well-nourished.  Non-toxic appearance. No distress.  HENT:  Head: Normocephalic and atraumatic.  Nose: Nose normal.  Mouth/Throat: Oropharynx is clear and moist.  Eyes: Conjunctivae and EOM are normal. Pupils are equal, round, and reactive to light. Right eye exhibits no discharge. Left eye exhibits no discharge.  Neck: Normal range of motion. Neck supple.  No c spine midline tenderness. No paraspinal tenderness. No deformities or step offs noted. Full ROM. Supple. No nuchal rigidity.    Cardiovascular: Normal rate, regular rhythm, normal heart sounds and intact distal pulses. Exam reveals no gallop and no friction  rub.  No murmur heard. Pulmonary/Chest: Effort normal and breath sounds normal. No stridor. No respiratory distress. She has no wheezes. She has no rales. She exhibits no tenderness.  Abdominal: Soft. Bowel sounds are normal. There is no tenderness. There is no rigidity, no rebound, no guarding and no CVA tenderness.  Musculoskeletal: Normal range of motion. She exhibits no tenderness.  Lymphadenopathy:    She has no cervical adenopathy.  Neurological: She is alert and oriented to person, place, and time.  Alert to place and person but not time which is baseline per family.  Patient moving all extremities any difficulties.  Follows commands appropriately.  Speech is at baseline.  Skin: Skin is warm and dry. Capillary  refill takes less than 2 seconds. Rash noted.  Patient with pruritic urticarial type hives to the diffuse body.  No signs of bullae or vesicular lesions.  Present on the palms.  Psychiatric: Her behavior is normal. Judgment and thought content normal.  Nursing note and vitals reviewed.    ED Treatments / Results  Labs (all labs ordered are listed, but only abnormal results are displayed) Labs Reviewed  COMPREHENSIVE METABOLIC PANEL - Abnormal; Notable for the following components:      Result Value   Sodium 133 (*)    Chloride 99 (*)    Glucose, Bld 108 (*)    Creatinine, Ser 1.39 (*)    Calcium 8.6 (*)    Total Protein 5.8 (*)    ALT 9 (*)    Total Bilirubin 2.1 (*)    GFR calc non Af Amer 33 (*)    GFR calc Af Amer 39 (*)    All other components within normal limits  CBC WITH DIFFERENTIAL/PLATELET  URINALYSIS, ROUTINE W REFLEX MICROSCOPIC  INFLUENZA PANEL BY PCR (TYPE A & B)  I-STAT CG4 LACTIC ACID, ED  I-STAT CG4 LACTIC ACID, ED    EKG  EKG Interpretation  Date/Time:  Tuesday August 03 2017 13:00:29 EST Ventricular Rate:  71 PR Interval:    QRS Duration: 85 QT Interval:  352 QTC Calculation: 383 R Axis:   -37 Text Interpretation:  Sinus rhythm Left  axis deviation Normal sinus rhythm Confirmed by Thomasene Lot Valatie [phone]) on 08/03/2017 2:15:47 PM       Radiology Dg Chest 2 View  Result Date: 08/03/2017 CLINICAL DATA:  Fever. EXAM: CHEST  2 VIEW COMPARISON:  Chest x-ray dated March 03, 2017. FINDINGS: The heart size and mediastinal contours are within normal limits. Normal pulmonary vascularity. Unchanged tortuous thoracic aorta. No focal consolidation, pleural effusion, or pneumothorax. No acute osseous abnormality. IMPRESSION: No active cardiopulmonary disease. Electronically Signed   By: Titus Dubin M.D.   On: 08/03/2017 14:21   Ct Head Wo Contrast  Result Date: 08/03/2017 CLINICAL DATA:  Recurrent syncope EXAM: CT HEAD WITHOUT CONTRAST TECHNIQUE: Contiguous axial images were obtained from the base of the skull through the vertex without intravenous contrast. COMPARISON:  03/03/2017 FINDINGS: BRAIN: There is sulcal and ventricular prominence consistent with superficial and central atrophy. No intraparenchymal hemorrhage, mass effect nor midline shift. Periventricular and subcortical white matter hypodensities consistent with chronic small vessel ischemic disease are identified. No acute large vascular territory infarcts. No abnormal extra-axial fluid collections. Basal cisterns are not effaced and midline. VASCULAR: Moderate calcific atherosclerosis of the carotid siphons. SKULL: No skull fracture. No significant scalp soft tissue swelling. SINUSES/ORBITS: The mastoid air-cells are clear. Bilateral lens replacements. Trace air-fluid levels in the sphenoid sinus with mucosal thickening noted. Mild-to-moderate ethmoid sinus mucosal thickening with left-sided ethmoid osteoma. Minimal mucosal thickening of the included right maxillary and frontal sinuses. OTHER: None. IMPRESSION: Chronic moderate small vessel ischemic disease and cerebral atrophy. No acute intracranial abnormality. Paranasal sinus mucosal thickening as above. Trace sphenoid  air-fluid levels may reflect an acute on chronic sinusitis. Electronically Signed   By: Ashley Royalty M.D.   On: 08/03/2017 14:56    Procedures Procedures (including critical care time)  Medications Ordered in ED Medications  acetaminophen (TYLENOL) tablet 1,000 mg (1,000 mg Oral Given 08/03/17 1433)  sodium chloride 0.9 % bolus 1,000 mL (1,000 mLs Intravenous New Bag/Given 08/03/17 1433)     Initial Impression / Assessment and Plan / ED Course  I have  reviewed the triage vital signs and the nursing notes.  Pertinent labs & imaging results that were available during my care of the patient were reviewed by me and considered in my medical decision making (see chart for details).     Patient presents to the ED for evaluation of syncopal episode and fever.  Patient's family also reports some altered mental status that is different from her baseline.  I am concerned that she may have a urinary tract infection.  Patient has been admitted last year for syncopal episode with a normal workup at that time.  On exam patient is pleasantly demented.  She does follow commands appropriately.  Her lungs clear to auscultation bilaterally.  No focal abdominal tenderness.  Patient does have a urticarial type rash to the entire body that is present on the palms.  No lower extremity edema.  No signs of intracranial, intrathoracic wrench or abdominal trauma.  Patient has no meningeal signs.  Patient is febrile.  No tachycardia or hypotension noted.  Patient satting 100% on room air and no tachypnea noted.  Lab work reveals no leukocytosis.  Creatinine mildly elevated from baseline of 1.1.  Today's creatinine was 1.39.  Otherwise elect lites are reassuring.  Bilirubin slightly elevated at 2.1.  Hemoglobin stable.  Influenza pending at this time.  Normal lactic acid.  UA pending as patient was tried to be in and out cath unable to obtain urine.  Bladder scanner showed 0 cc of urine in the bladder.  Patient given fluids  for hydration.  Chest x-ray shows no focal infiltrate.  CT head was unremarkable.  EKG showed normal sinus rhythm.  Unknown etiology of patient's symptoms.  Suspect flu versus UTI.  Will need to obtain UA and urine culture.  Patient does not meet any Sirs or sepsis criteria.  Fever treated with Tylenol.  Patient has no meningeal signs and doubt meningitis.  Patient does have urticarial type rash to her body.  History of same.  Will avoid Benadryl at this time given patient's age and history of dementia.  Will give topical steroids.  Low suspicion for shingles.  Given patient's fever and episode of syncope today she would benefit from admission.  Spoke with internal medicine teaching service who agrees to admission will see patient in ED and place admission orders.  Patient remains hemodynamically stable this time and family and patient was updated on plan of care.  Pt seen and eval by my attending who is agreeable with the above plan.   Final Clinical Impressions(s) / ED Diagnoses   Final diagnoses:  Syncope, unspecified syncope type  Fever, unspecified fever cause    ED Discharge Orders    None       Aaron Edelman 08/03/17 1530    [place], Fredia Sorrow, MD 08/07/17 [phone]

## 2017-08-03 NOTE — H&P (Signed)
Date: 08/03/2017               Patient Name:  Morgan Weaver MRN: [id number]  DOB: 1929/03/02 Age / Sex: 84 y.o., female   PCP: Leonard Downing, MD         Medical Service: Internal Medicine Teaching Service         Attending Physician: Dr. Rebeca Alert Raynaldo Opitz, MD    First Contact: Dr. Ronalee Red Pager: [phone]  Second Contact: Dr. Philipp Ovens Pager: [phone]       After Hours (After 5p/  First Contact Pager: [phone]  weekends / holidays): Second Contact Pager: [phone]   Chief Complaint: syncope, fever  History of Present Illness:  Morgan Weaver is an 84yo female with PMH of hypothyroidism, dementia, hx of syncope, and afib who presents after a syncopal episode. Two sons at bedside who assist with the history.  She does not recall the events from earlier today. Son reports that he went to check on her this morning around 11am and found her still in bed. He helped get her out of bed and into a chair. She was not able to dress herself and he returned to find her slumped over in the chair with her eyes rolled back and unresponsive, so he brought her to the ED. Yesterday, she was also noted to have 3 episodes of urinary incontinence, which is abnormal for her, and seemed to be more weak than usual. Sons deny that she has been complaining of anything in particular, although have noted an increased cough in the last couple days. She does not eat much at baseline and no recent weight loss. No recent medication changes. Patient denies chest pain, shortness of breath, dysuria, or leg swelling.   She is noted to have a diffuse pruritic rash. Son first noticed it this morning, he is not sure how long the rash has been present. Patient is also unaware of duration. No new foods, no food allergies that sons are aware of.  At baseline, she ambulates without assistance, occasionally prepares food for herself, and is able to dress herself and maintain personal hygiene. However, sons state that this has  gradually been declining for the past few years and is likely to a point where she needs more assistance than before.  Denies tobacco use or other illicit drug use. Endorses occasional alcohol use.  ED Course: - BP 117/65, HR 64, temp 101.3, RR 11, O2 96% on RA - CBC unremarkable. Lactic acid 1.82. CMP with Na 133, Cr 1.39. Flu panel negative. - CXR without acute disease. CT head with chronic moderate small vessel ischemic disease and cerebral atrophy without acute intracranial abnormality, as well as paranasal sinus mucosal thickening. EKG with NSR, low voltages, no ST elevation. - Attempted in/out cath with 0cc of urine in bladder. Received tylenol and 1L NS bolus.   Meds:  No outpatient medications have been marked as taking for the 08/03/17 encounter [facility] Encounter).   Allergies: Allergies as of 08/03/2017 - Review Complete 08/03/2017  Allergen Reaction Noted  . Exelon [rivastigmine tartrate] Other (See Comments) 03/05/2016  . Aricept [donepezil hcl] Nausea And Vomiting and Other (See Comments) 03/03/2016  . Mobic [meloxicam] Other (See Comments) 11/16/2012  . Namenda [memantine hcl] Nausea And Vomiting and Other (See Comments) 03/03/2016  . Tetracyclines & related Swelling 10/17/2010  . Tape Other (See Comments) 03/03/2017  . Ancef [cefazolin] Rash 03/18/2013  . Sulfa drugs cross reactors Other (See Comments) 10/17/2010   Past  Medical History:  Diagnosis Date  . Arthritis   . Hemorrhoid   . Hyperlipidemia   . Hypertension   . Lyme disease   . Ovarian cyst   . Thyroid disease    hypothyroidism  . Torn rotator cuff   . Weight decrease    Family History:  Family History  Problem Relation Age of Onset  . Heart disease Father        heart attack  . Hypertension Father   . Other Son        brain tumor    Social History:  Denies tobacco use or other illicit drug use. Endorses occasional alcohol use. - Lives at home with son - Has 3 sons in the area  Review of  Systems: A complete ROS was negative except as per HPI.   Physical Exam: Blood pressure 129/84, pulse 73, temperature (!) 101.3 F (38.5 C), temperature source Rectal, resp. rate 19, SpO2 99 %.  GEN: Well-nourished elderly female lying in bed in NAD. Alert and oriented to self and birthday (not oriented to location or year). HENT: [place]/AT. Moist mucous membranes. No visible lesions. EYES: PERRL. Sclera non-icteric. Conjunctiva clear. RESP: Clear to auscultation bilaterally. No wheezes, rales, or rhonchi. No increased work of breathing. CV: NR & RR. No murmurs, gallops, or rubs. No carotid bruits. No LE edema. ABD: Soft. Non-tender. Non-distended. Normoactive bowel sounds. EXT: No edema. Warm. 2+ DP pulses. NEURO: Cranial nerves II-XII grossly intact. 5/5 strength in BUE and BLE. Normal sensation. Normal FNF, normal heel-shin. No dysdiadochokinesia. No apparent audiovisual hallucinations. Speech fluent and appropriate. PSYCH: Patient is calm and pleasant. Appropriate affect. Well-groomed; speech is appropriate and on-subject. SKIN: Poor skin turgor. Diffuse circular non-palpable non-blanching pruritic erythematous urticaria, primarily on BLE, abdomen, and upper back. Also on face and arms.     Labs CBC Latest Ref Rng & Units 08/03/2017 03/05/2017 03/04/2017  WBC 4.0 - 10.5 K/uL 8.4 8.4 7.5  Hemoglobin 12.0 - 15.0 g/dL 13.5 11.9(L) 11.6(L)  Hematocrit 36.0 - 46.0 % 40.0 35.4(L) 34.6(L)  Platelets 150 - 400 K/uL 169 171 179   CMP Latest Ref Rng & Units 08/03/2017 03/05/2017 03/04/2017  Glucose 65 - 99 mg/dL 108(H) 88 86  BUN 6 - 20 mg/dL 17 22(H) 17  Creatinine 0.44 - 1.00 mg/dL 1.39(H) 1.17(H) 1.11(H)  Sodium 135 - 145 mmol/L 133(L) 128(L) 129(L)  Potassium 3.5 - 5.1 mmol/L 4.4 4.3 3.8  Chloride 101 - 111 mmol/L 99(L) 94(L) 97(L)  CO2 22 - 32 mmol/L 22 23 23   Calcium 8.9 - 10.3 mg/dL 8.6(L) 9.0 8.9  Total Protein 6.5 - 8.1 g/dL 5.8(L) - -  Total Bilirubin 0.3 - 1.2 mg/dL 2.1(H) - -    Alkaline Phos 38 - 126 U/L 50 - -  AST 15 - 41 U/L 33 - -  ALT 14 - 54 U/L 9(L) - -   Flu panel negative Lactic acid 1.82  EKG: personally reviewed my interpretation is NSR, low voltage, no ST elevation or T wave inversions noted, normal intervals  CXR: personally reviewed my interpretation is no consolidation or effusion, normal heart size  Assessment & Plan by Problem: Active Problems:   Syncope  Ms. Cancio is an 84yo female with PMH of hypothyroidism, dementia, hx of syncope, and afib who presents after a syncopal episode and 3 episodes of urinary incontinence, noted to be febrile to 101.3 in the ED and diffuse pruritic urticarial rash present.  Fever, urinary incontinence Noted to be febrile on admission  to 101.3. No leukocytosis. HDS. Noted to have an increased mild cough for the last 2 days. CXR without evidence of pneumonia. Flu panel negative. Son's report 3 episodes of urinary incontinence yesterday, which is unusual for her. No clear source yet and patient otherwise clinically stable, so will hold off on starting antibiotics at this point. - Monitor fever curve - UA - BCx - CBC and BMP in AM - Check RVP  Syncope Does appear somewhat volume down on exam, with soft-normal BP and poor skin turgor. Also noted to have a mild AKI and no urine on bladder scan. Received 1L IVF in ED. Most recent syncopal episode may be secondary to dehydration vs vasovagal. She does have a history of previous syncopal episodes with unremarkable work-up thus far. Has been admitted several times in the past for this, most recently in 02/2017, which was thought to be secondary to mechanical fall. Echo in 02/2017 for syncope showed LVEF 60-65% and grade 1 DD. Event monitor in 04/2017 with asymptomatic PVCs, no etiology for syncope. Follows with Dr. Rayann Heman. They have discussed implantable loop recorder placement vs conservative management and patient's son opted for conservative management at that time. -  Telemetry - Orthostatics - IV NS 118mL/hr - Workup for febrile illness, as above  Diffuse pruritic urticarial rash First noticed this morning by her sons. No recent medication or dietary changes. Unclear etiology, possibly allergic reaction vs contact dermatitis? - Triamcinolone cream - Eucerin cream  Atrial fibrillation Rate controlled, not on any medication. CHADS2-VASc score at least 3, with 4.6% annual risk of CVA. Per outpt cardiologist, not on chronic anticoagulation due to fall risk and advanced dementia. CT head negative for acute CVA.  - Continue to monitor  Hypothyroidism - Continue home levothyroxine 154mcg qday  Dementia Follows with Neurology. Previously on rivastigmine and memantine, however these were discontinued due to concern that these were causing sinus pauses. - Delirium precautions  Diet: Regular VTE PPx: Lovenox 30mg  Code Status: Full code Dispo: Admit patient to Inpatient with expected length of stay greater than 2 midnights.  Signed: Colbert Ewing, MD 08/03/2017, 4:15 PM  Pager: Mamie Nick [phone]

## 2017-08-03 NOTE — ED Notes (Signed)
Attempted in and out cath. Bladder scan showed 0. MD notified

## 2017-08-03 NOTE — ED Triage Notes (Signed)
Pt arrives via EMS from home with complaints of a syncopal episode when getting out of bed this AM. Family reports pt was incontinent over night. Denies hitting head.

## 2017-08-04 DIAGNOSIS — R55 Syncope and collapse: Secondary | ICD-10-CM

## 2017-08-04 DIAGNOSIS — I4891 Unspecified atrial fibrillation: Secondary | ICD-10-CM

## 2017-08-04 DIAGNOSIS — Z91048 Other nonmedicinal substance allergy status: Secondary | ICD-10-CM | POA: Diagnosis not present

## 2017-08-04 DIAGNOSIS — L509 Urticaria, unspecified: Secondary | ICD-10-CM | POA: Diagnosis not present

## 2017-08-04 DIAGNOSIS — Z881 Allergy status to other antibiotic agents status: Secondary | ICD-10-CM | POA: Diagnosis not present

## 2017-08-04 DIAGNOSIS — F0390 Unspecified dementia without behavioral disturbance: Secondary | ICD-10-CM

## 2017-08-04 DIAGNOSIS — R509 Fever, unspecified: Secondary | ICD-10-CM | POA: Diagnosis not present

## 2017-08-04 DIAGNOSIS — Z888 Allergy status to other drugs, medicaments and biological substances status: Secondary | ICD-10-CM

## 2017-08-04 DIAGNOSIS — Z886 Allergy status to analgesic agent status: Secondary | ICD-10-CM | POA: Diagnosis not present

## 2017-08-04 LAB — BASIC METABOLIC PANEL
Anion gap: 11 (ref 5–15)
BUN: 21 mg/dL — ABNORMAL HIGH (ref 6–20)
CO2: 20 mmol/L — ABNORMAL LOW (ref 22–32)
Calcium: 8.3 mg/dL — ABNORMAL LOW (ref 8.9–10.3)
Chloride: 103 mmol/L (ref 101–111)
Creatinine, Ser: 1.42 mg/dL — ABNORMAL HIGH (ref 0.44–1.00)
GFR calc Af Amer: 38 mL/min — ABNORMAL LOW (ref >60)
GFR calc non Af Amer: 32 mL/min — ABNORMAL LOW (ref >60)
Glucose, Bld: 92 mg/dL (ref 65–99)
Potassium: 4 mmol/L (ref 3.5–5.1)
Sodium: 134 mmol/L — ABNORMAL LOW (ref 135–145)

## 2017-08-04 LAB — TSH: TSH: 4.347 u[IU]/mL (ref 0.350–4.500)

## 2017-08-04 LAB — URINALYSIS, ROUTINE W REFLEX MICROSCOPIC
Bilirubin Urine: NEGATIVE
Glucose, UA: NEGATIVE mg/dL
Ketones, ur: NEGATIVE mg/dL
Nitrite: NEGATIVE
Protein, ur: NEGATIVE mg/dL
Specific Gravity, Urine: 1.026 (ref 1.005–1.030)
pH: 5 (ref 5.0–8.0)

## 2017-08-04 LAB — RPR: RPR Ser Ql: NONREACTIVE

## 2017-08-04 MED ORDER — TRIAMCINOLONE ACETONIDE 0.1 % EX CREA: TOPICAL_CREAM | Freq: Two times a day (BID) | CUTANEOUS | 0 refills | Status: DC | PRN

## 2017-08-04 MED ORDER — HYDROCERIN EX CREA: 1.0000 "application " | TOPICAL_CREAM | Freq: Two times a day (BID) | CUTANEOUS | 0 refills | Status: DC | PRN

## 2017-08-04 NOTE — Progress Notes (Signed)
The patient has been given discharge instructions along with a new medication list and what to take today. The patient's sons are at bedside including the son that lives with her. She has prescriptions to pick up and a follow up appointment to make. She is discharging with her sons via car.   Saddie Benders RN

## 2017-08-04 NOTE — Discharge Summary (Signed)
Name: Morgan Weaver MRN: [id number] DOB: 16-Nov-1928 84 y.o. PCP: Leonard Downing, MD  Date of Admission: 08/03/2017 12:47 PM Date of Discharge: 08/04/2017 Attending Physician: Oda Kilts, MD  Discharge Diagnosis: 1. Syncope 2. Fever 3. Diffuse urticarial rash 4. Atrial fibrillation  Active Problems:   Syncope   Discharge Medications: Allergies as of 08/04/2017      Reactions   Exelon [rivastigmine Tartrate] Other (See Comments)   Asystole/sinus pauses/syncope   Aricept [donepezil Hcl] Nausea And Vomiting, Other (See Comments)   Irregular heartbeat also   Mobic [meloxicam] Other (See Comments)   Abdominal pain, GI ulcer (?)   Namenda [memantine Hcl] Nausea And Vomiting, Other (See Comments)   Irregular heartbeat also   Tetracyclines & Related Swelling   Lips swell   Tape Other (See Comments)   TAPE BRUISES THE SKIN (IT IS VERY THIN)   Ancef [cefazolin] Rash   Diffuse rash with urticaria, no respiratory involvement   Sulfa Drugs Cross Reactors Other (See Comments)   Reaction not recalled by the patient      Medication List    TAKE these medications   GINKGO BILOBA PO Take 1 capsule by mouth daily as needed (memory).   hydrocerin Crea Apply 1 application topically 2 (two) times daily as needed. Apply to affected area as needed 15 minutes after triamcinolone cream   levothyroxine 125 MCG tablet Commonly known as:  SYNTHROID, LEVOTHROID Take 125 mcg by mouth daily before breakfast.   triamcinolone cream 0.1 % Commonly known as:  KENALOG Apply topically 2 (two) times daily as needed. Apply to affected areas as needed.   VITAMIN D-3 PO Take 1 capsule by mouth daily.       Disposition and follow-up:   Ms.Morgan Weaver was discharged from [facility] in Stable condition.  At the hospital follow up visit please address:  1.  Syncope: Has she had further episodes of syncope? Is she maintaining appropriate PO intake?  2.  Fever:  Any infectious s/s or urinary symptoms?  3.  Rash: Has her rash resolved?  4.  Labs / imaging needed at time of follow-up: None  5.  Pending labs/ test needing follow-up: RPR, BCx  Follow-up Appointments: Follow-up Information    Leonard Downing, MD. Schedule an appointment as soon as possible for a visit in 2 week(s).   Specialty:  Family Medicine Contact information: [place] [address] [phone]           Hospital Course by problem list: Active Problems:   Syncope   1. Syncope Ms. Chrobak was admitted on 2/19 with concern for an unresponsive episode. Patient does not recall any of the events leading to her admission. Sons report that she was slumped over in her chair and not responsive. She has a history of previous syncopal episodes with unremarkable work-up thus far, including echo in 02/2017 and event monitor in 04/2017. She follows with Cardiology (Dr. Rayann Heman) and at her last visit in January 2019, they discussed implantable loop recorder placement vs conservative management and patient's son opted for conservative management at that time. Telemetry was reviewed, which was unremarkable. She did appear volume down on exam and reported decreased PO intake. Syncopal episode may have been secondary to dehydration. Unfortunately, orthostatics were not completed on admission. She received IVF resuscitation with clinical improvement. The following morning, she reported feeling well. She was evaluated by PT/OT who recommended discharge home with supervision, which sons are able  to provide.  2. Fever She was noted to have a fever to 101.3 on admission. Sons reported she had a mild cough for the last 2 days, but patient denied other respiratory symptoms. She was also noted by family to have 3 episodes of urinary incontinence the day prior to admission, and there was some initial concern for UTI, however UA was not convincing for UTI and patient denied urinary  symptoms. TSH normal. Blood cultures were drawn on admission, which were still pending at the time of discharge. She remained afebrile throughout the remainder of her hospitalization and due to no clear source for her fever, no antibiotics were administered.  3. Diffuse urticarial rash Patient was noted to have a diffuse pruritic blanching urticarial rash on admission, which the sons first noted that morning. She denied recent medication or dietary changes. RPR was collected, which was pending on discharge. The rash was improved the following morning and previous areas had cleared while some smaller new areas had appeared. The etiology was felt to be a possible allergic reaction and it improved with triamcinolone and eucerin cream.  4. Atrial fibrillation Rate controlled throughout her hospitalization, not on any medication. CHADS2-VASc score at least 3, with 4.6% annual risk of CVA. Per outpatient cardiologist, patient is not on chronic anticoagulation due to fall risk and advanced dementia. CT head negative for acute CVA.   Discharge Vitals:   BP 112/62 (BP Location: Right Arm)   Pulse 63   Temp 98.3 F (36.8 C) (Axillary)   Resp 19   Ht 5\' 6"  (1.676 m)   Wt 148 lb 2.4 oz (67.2 kg)   SpO2 94%   BMI 23.91 kg/m   Pertinent Labs, Studies, and Procedures:  CBC Latest Ref Rng & Units 08/03/2017 03/05/2017 03/04/2017  WBC 4.0 - 10.5 K/uL 8.4 8.4 7.5  Hemoglobin 12.0 - 15.0 g/dL 13.5 11.9(L) 11.6(L)  Hematocrit 36.0 - 46.0 % 40.0 35.4(L) 34.6(L)  Platelets 150 - 400 K/uL 169 171 179   CMP Latest Ref Rng & Units 08/04/2017 08/03/2017 08/03/2017  Glucose 65 - 99 mg/dL 92 - 108(H)  BUN 6 - 20 mg/dL 21(H) - 17  Creatinine 0.44 - 1.00 mg/dL 1.42(H) - 1.39(H)  Sodium 135 - 145 mmol/L 134(L) - 133(L)  Potassium 3.5 - 5.1 mmol/L 4.0 - 4.4  Chloride 101 - 111 mmol/L 103 - 99(L)  CO2 22 - 32 mmol/L 20(L) - 22  Calcium 8.9 - 10.3 mg/dL 8.3(L) - 8.6(L)  Total Protein 6.5 - 8.1 g/dL - - 5.8(L)  Total  Bilirubin 0.3 - 1.2 mg/dL - 1.5(H) 2.1(H)  Alkaline Phos 38 - 126 U/L - - 50  AST 15 - 41 U/L - - 33  ALT 14 - 54 U/L - - 9(L)   Lactic acid 1.82 Flu negative RVP negative RPR pending BCx 2/19 -> pending UA with small Hgb, small leukocytes, negative nitrite, 6-30 WBC, rare bacteria, 0-5 squam epithelial cells TSH 4.347  CXR 08/03/2017 No active cardiopulmonary disease  CT head 08/03/2017 Chronic moderate small vessel ischemic disease and cerebral atrophy. No acute intracranial abnormality. Paranasal sinus mucosal thickening as above. Trace sphenoid air-fluid levels may reflect an acute on chronic sinusitis.  Discharge Instructions: Discharge Instructions    Call MD for:  difficulty breathing, headache or visual disturbances   Complete by:  As directed    Call MD for:  hives   Complete by:  As directed    Call MD for:  persistant dizziness or light-headedness  Complete by:  As directed    Call MD for:  redness, tenderness, or signs of infection (pain, swelling, redness, odor or green/yellow discharge around incision site)   Complete by:  As directed    Call MD for:  severe uncontrolled pain   Complete by:  As directed    Call MD for:  temperature >100.4   Complete by:  As directed    Diet - low sodium heart healthy   Complete by:  As directed    Discharge instructions   Complete by:  As directed    Ms. Simerly,  You were admitted to the hospital for a fever, dehydration, and a rash. There was no evidence of an infection in the labs and imaging tests that we did, and you did not have another fever while you were here. I am not sure what caused the fever, but it may have been a self-resolving virus infection. The virus may also have caused you to be more dehydrated, which can cause weakness. We gave you fluids through your vein and you were able to work with therapy the following day. They recommended you go home with continued support from your sons. Please make sure to eat and  drink water throughout the day.  We also treated you for a rash with steroid cream and eucerin cream. We are not sure the exact cause, but it seemed most consistent with an allergic reaction. Your rash had improved by the time you left the hospital. If you do see new itchy spots appear, you can use the steroid cream and eucerin cream twice a day as needed for those areas. You probably will not need to use these creams long-term and the rash should completely resolve. Once the rash is gone, do not keep using the steroid cream.  Please make an appointment to follow up with your primary doctor to make sure everything is still going okay once you leave the hospital.   Increase activity slowly   Complete by:  As directed       Signed: Colbert Ewing, MD 08/04/2017, 1:57 PM   Pager: Mamie Nick [phone]

## 2017-08-04 NOTE — Evaluation (Signed)
Physical Therapy Evaluation Patient Details Name: Morgan Weaver MRN: [id number] DOB: [birth year] Today's Date: 08/04/2017   History of Present Illness  Morgan Weaver is an 84yo female with PMH of hypothyroidism, dementia, hx of syncope, and afib who presents after a syncopal episode.  Also found a diffuse pruritic rash on upper torso and legs of unknown origin.  Clinical Impression  Pt is close to baseline functioning and should be safe at home with family assist. There are no further acute PT needs.  Will sign off at this time.     Follow Up Recommendations Supervision/Assistance - 24 hour;No PT follow up    Equipment Recommendations  None recommended by PT    Recommendations for Other Services       Precautions / Restrictions Precautions Precautions: Fall Restrictions Weight Bearing Restrictions: No      Mobility  Bed Mobility Overal bed mobility: Independent                Transfers Overall transfer level: Needs assistance   Transfers: Sit to/from Stand Sit to Stand: Supervision            Ambulation/Gait Ambulation/Gait assistance: Supervision Ambulation Distance (Feet): 200 Feet(with 1 standing break) Assistive device: None Gait Pattern/deviations: Step-through pattern     General Gait Details: short guarded steps, generally steady.  pt able to scan her environment  Stairs            Wheelchair Mobility    Modified Rankin (Stroke Patients Only)       Balance Overall balance assessment: Needs assistance;No apparent balance deficits (not formally assessed)   Sitting balance-Leahy Scale: Good     Standing balance support: No upper extremity supported Standing balance-Leahy Scale: Fair                               Pertinent Vitals/Pain Pain Assessment: No/denies pain    Home Living Family/patient expects to be discharged to:: Private residence Living Arrangements: Children Available Help at Discharge: Family;Available  PRN/intermittently Type of Home: House Home Access: Stairs to enter Entrance Stairs-Rails: Psychiatric nurse of Steps: 3 Home Layout: One level Home Equipment: None      Prior Function Level of Independence: Independent         Comments: cooks, Development worker, international aid Dominance   Dominant Hand: Right    Extremity/Trunk Assessment   Upper Extremity Assessment Upper Extremity Assessment: Generalized weakness    Lower Extremity Assessment Lower Extremity Assessment: Overall WFL for tasks assessed       Communication   Communication: No difficulties  Cognition Arousal/Alertness: Awake/alert Behavior During Therapy: WFL for tasks assessed/performed Overall Cognitive Status: History of cognitive impairments - at baseline                                        General Comments General comments (skin integrity, edema, etc.): poor short term memory.  pt would do best in her own familiar enrironment.    Exercises     Assessment/Plan    PT Assessment Patent does not need any further PT services  PT Problem List         PT Treatment Interventions      PT Goals (Current goals can be found in the Care Plan section)  Acute Rehab PT Goals PT Goal Formulation: All assessment and  education complete, DC therapy    Frequency     Barriers to discharge        Co-evaluation               AM-PAC PT "6 Clicks" Daily Activity  Outcome Measure Difficulty turning over in bed (including adjusting bedclothes, sheets and blankets)?: None Difficulty moving from lying on back to sitting on the side of the bed? : None Difficulty sitting down on and standing up from a chair with arms (e.g., wheelchair, bedside commode, etc,.)?: None Help needed moving to and from a bed to chair (including a wheelchair)?: A Little Help needed walking in hospital room?: A Little Help needed climbing 3-5 steps with a railing? : A Little 6 Click Score: 21    End  of Session   Activity Tolerance: Patient tolerated treatment well Patient left: in bed;with call bell/phone within reach;with bed alarm set Nurse Communication: Mobility status PT Visit Diagnosis: Unsteadiness on feet (R26.81)    Time: 8280-0349 PT Time Calculation (min) (ACUTE ONLY): 31 min   Charges:   PT Evaluation $PT Eval Low Complexity: 1 Low PT Treatments $Gait Training: 8-22 mins   PT G Codes:        Aug 18, 2017  Donnella Sham, PT [phone]  (pager)  Morgan Weaver Morgan Weaver 08-18-17, 11:58 AM

## 2017-08-04 NOTE — Care Management Obs Status (Signed)
[place] NOTIFICATION   Patient Details  Name: MIRACLE MONGILLO MRN: [id number] Date of Birth: May 04, 1929   Medicare Observation Status Notification Given:  Yes    Carles Collet, RN 08/04/2017, 10:53 AM

## 2017-08-04 NOTE — Care Management CC44 (Signed)
Condition Code 44 Documentation Completed  Patient Details  Name: DESIRE FULP MRN: [id number] Date of Birth: [birth year]   Condition Code 44 given:  Yes Patient signature on Condition Code 44 notice:  Yes Documentation of 2 MD's agreement:  Yes Code 44 added to claim:  Yes    Carles Collet, RN 08/04/2017, 10:56 AM

## 2017-08-04 NOTE — Progress Notes (Signed)
Subjective:  Morgan Weaver was seen lying in bed this morning. She was eating breakfast and tolerating PO intake, however she stated the food was not very good. She denied lightheadedness or dizziness. Reports her cough is improved and denies shortness of breath. Denies dysuria or problems urinating. Has not been out of bed yet. Continues to endorse pruritus, particularly on anterior chest wall. Cannot recall the events leading to her admission.  Objective:  Vital signs in last 24 hours: Vitals:   08/03/17 1600 08/03/17 1718 08/03/17 2021 08/04/17 0537  BP: 129/84 111/65 93/65 112/62  Pulse: 73 63 63 63  Resp: 19     Temp:  98 F (36.7 C) 98.3 F (36.8 C) 98.3 F (36.8 C)  TempSrc:  Oral Axillary Axillary  SpO2: 99% 97% 97% 94%  Weight:  147 lb 11.3 oz (67 kg)  148 lb 2.4 oz (67.2 kg)  Height:  5\' 6"  (1.676 m)     GEN: Well-nourished elderly female lying in bed in NAD. Alert and oriented to self and birthday (not oriented to location or year). HENT: [place]/AT. Moist mucous membranes. No visible lesions. EYES: PERRL. Sclera non-icteric. Conjunctiva clear. RESP: Clear to auscultation bilaterally. No wheezes, rales, or rhonchi. No increased work of breathing. CV: NR & RR. No murmurs, gallops, or rubs. No carotid bruits. No LE edema. ABD: Soft. Non-tender. Non-distended. Normoactive bowel sounds. EXT: No edema. Warm. 2+ DP pulses. NEURO: Cranial nerves II-XII grossly intact. 5/5 strength in BUE and BLE. Normal sensation. No apparent audiovisual hallucinations. Speech fluent and appropriate. PSYCH: Patient is calm and pleasant. Appropriate affect. Well-groomed; speech is appropriate and on-subject. SKIN: Poor skin turgor. Diffuse pruritic circular non-palpable blanching pruritic erythematous urticaria, primarily on anterior chest and lower back. Locations have changed compared to yesterday and have mostly cleared on BUE and BLE.  Assessment/Plan:  Active Problems:   Syncope  Ms. Bousquet is  an 84yo female with PMH of hypothyroidism, dementia, hx of syncope, and afib who presents after a syncopal episode and 3 episodes of urinary incontinence, noted to be febrile to 101.3 in the ED and diffuse pruritic urticarial rash present. Initial concern was for UTI, however UA not convincing for infection and patient denies urinary symptoms, so will hold off on treating for now. She has been afebrile since initial fever. RVP and flu negative. Diffuse rash appears improved and in different locations today, more consistent with hives/allergic reaction.   Fever Unclear etiology. Noted to be febrile on admission to 101.3. She is now afebrile and HDS. No leukocytosis. Cough is improved, denies other respiratory symptoms. CXR without evidence of pneumonia. Flu and RVP negative. With 3 episodes of urinary incontinence prior to admission, there was some concern for UTI, however UA not convincing for infection and patient denies urinary symptoms. - Monitor fever curve - F/u BCx 2/19 ->  Syncope Denies lightheadedness or dizziness after IVF administration. Reviewed telemetry, which was unremarkable. Unfortunately, orthostatics were never completed. Possibly related to dehydration. Patient has not been up and walking yet, but will have PT/OT evaluate today and possible discharge later today if she is stable. She has a history of previous syncopal episodes with unremarkable work-up thus far. Has been admitted several times in the past for this, most recently in 02/2017, which was thought to be secondary to mechanical fall. Echo in 02/2017 for syncope showed LVEF 60-65% and grade 1 DD. Event monitor in 04/2017 with asymptomatic PVCs, no etiology for syncope. Follows with Dr. Rayann Heman. They have discussed  implantable loop recorder placement vs conservative management and patient's son opted for conservative management at that time. - Telemetry - PT/OT eval - Possible d/c home today pending PT/OT  evaluation  Diffuse urticaria Appears improved on examination today with new areas of involvement and clearance of other areas. No recent medication or dietary changes. RPR pending. Unclear etiology, possibly allergic reaction. Appears to be improving with triamcinolone cream and eucerin. Patient continues to report some pruritus, particularly on her anterior upper chest. - Triamcinolone cream - Eucerin cream - F/u RPR  Atrial fibrillation Rate controlled, not on any medication. CHADS2-VASc score at least 3, with 4.6% annual risk of CVA. Per outpt cardiologist, not on chronic anticoagulation due to fall risk and advanced dementia. - Continue to monitor  Hypothyroidism TSH normal. - Continue home levothyroxine 14mcg qday  Dementia Follows with Neurology. Previously on rivastigmine and memantine, however these were discontinued due to concern that these were causing sinus pauses. - Delirium precautions  Dispo: Anticipated discharge in approximately 0-1 day(s).   Colbert Ewing, MD 08/04/2017, 10:49 AM Pager: Mamie Nick [phone]

## 2017-08-09 LAB — CULTURE, BLOOD (ROUTINE X 2)
Culture: NO GROWTH
Culture: NO GROWTH
Special Requests: ADEQUATE
Special Requests: ADEQUATE

## 2017-12-31 ENCOUNTER — Encounter: Payer: Self-pay | Admitting: Neurology

## 2017-12-31 ENCOUNTER — Ambulatory Visit: Payer: Medicare Other | Admitting: Neurology

## 2017-12-31 VITALS — BP 179/83 | HR 55 | Ht 63.25 in | Wt 145.0 lb

## 2017-12-31 DIAGNOSIS — F0391 Unspecified dementia with behavioral disturbance: Secondary | ICD-10-CM | POA: Diagnosis not present

## 2017-12-31 DIAGNOSIS — I63112 Cerebral infarction due to embolism of left vertebral artery: Secondary | ICD-10-CM | POA: Diagnosis not present

## 2017-12-31 MED ORDER — SERTRALINE HCL 25 MG PO TABS: 25.0000 mg | ORAL_TABLET | Freq: Every day | ORAL | 11 refills | Status: AC

## 2017-12-31 NOTE — Progress Notes (Signed)
[facility]    Provider:  Dr Jaynee Eagles Referring Provider: Leonard Downing, * Primary Care Physician:  Leonard Downing, MD  PATIENT: Morgan Weaver DOB: 01-16-1929  REASON FOR VISIT: follow up for memory loss, B 12 defiency HISTORY FROM: Patient and Morgan Weaver  12/31/2017: lives up the street from her Morgan Weaver Morgan Weaver. Drew lives with her, her Morgan Weaver. Keeps an eye on her. She has an aid that comes in 5 days a week from 8-2pm. She has had hospitalizations, syncopal episodes, EMS has been called several times. She is cooking over the stove get lightheaded and on the floor. May be dehydration. They are watching her moe. She did not tolerate aricept or namenda, now they help with medications (she double dosed on namenda). She is still having delusions like people are stealing from her. Memory continues to decline. She feeds some Ferral cats, inside cat died. pcp checks levels on B12. She is agitated and won't take showers. Slowly declining.   Interval history 12/23/2015: She lives with her other Morgan Weaver. She sometimes dreams things and thinks they are real but she does not wander and no problems at night. MoCA is slightly improved today. She thinks her brother is taking things from her. Explained delusions are common in dementia such as people stealing, Morgan Weaver denies it. She says "they are taking plenty of stuff right now". But then again I explained to Morgan Weaver that he should monitor to ensure it is not delusions. She says they took all her old photographs but they brought them back, they have gone into her stuff and stolen things like a book that was written on growing fruit trees and other books. She had some outdoor cats and she says she saw Mexicans get her cats  And ate them. Discussed dementia and that delusions, hallucinations can happen in the later stages and this may be due to her dementia. Recommended the 36-hour day. The namzeric made her sick. She is on Namenda. Will try exelon. If she does not  tolerate try patch.  Interval history 02/12/2015: She is a patient of a previous physician in our practice and she is transitioning to me today. Past medical history of dementia. She is here with her Morgan Weaver who provides most information. After her last visit she had driving evaluation and the recommendation was that she only drive during daylight hours and only short distances in familiar places. No highway driving.She started taking the Aricept and threw up. She flushed the pills down the toilet. She is here with her Morgan Weaver. Most of the time she does well. Morgan Weaver says she does well if he reminds her. She feels she is stable, no significant decline in her memory. She said the driving test was a rip off but she did it and laughs very pleasantly. She still drives, only during the day. No accidents. She lives with her wonderful Morgan Weaver she says. No accidents in the home, she hasn't left the stove on for example or had any recent falls. She cooks and clean and shops. She is here with her Morgan Weaver that doesn't live with her. No disorientation or confusion as noted in 02/2013 and they credit her improvement to B12 supplementation.She "popped back" very well until she broke her hip shortly after that and was in a nursing home. She performs all her own ADLs. She manages her own medications. She declines to take Aricept anymore even though she has been taking it for a long time successfully.   Personally reviewed MRI of  the brain 03/04/2013: IMPRESSION:  Abnormal MRI brain (without) demonstrating: 1. Moderate cortical and subcortical atrophy. 2. Moderate chronic small vessel ischemic disease. 3. Compared to prior MRI on 05/08/09, there has been some progression of atrophy and chronic small vessel ischemic disease.  Reviewed previous records:  HISTORY OF PRESENT ILLNESS:Ms 26, 84 year old female returns for follow-up for cognitive decline. She was last seen in this office by Dr. Janann Colonel 01/29/2014 who has left our practice. After  her last visit she had driving evaluation and the recommendation was that she only drive during daylight hours and only short distances in familiar places. No highway driving.  She is taking daily oral B12 supplementation. Continues to have difficulty mainly with short term memory but feels there has been no further decline. Taking Aricept 10mg  nightly, she denies side effects to the medication. Her MOCA score has declined since last seen. 03/02/2013 Abnormal MRI brain (without) demonstrating: Moderate cortical and subcortical atrophy. Moderate chronic small vessel ischemic disease. Compared to prior MRI on 05/08/09, there has been some progression of atrophy and chronic small vessel ischemic disease. Explained the results to the Morgan Weaver and to the patient. The patient does not remember being called about this nor does the Morgan Weaver. She can still cook and perform her ADLs. One of her sons lives with her. She returns for reevaluation.  Initial visit 02/2013: Patient presents today with her Morgan Weaver who provides the majority of the history. Per the Morgan Weaver she has had some memory trouble for the past few years, they noticed a drastic decline in the past 6-8 weeks. Most notably they noticed trouble with short term memory, increased confusion, disorientation. The Morgan Weaver notes that recently she forgot she and owned her own home. Having trouble with basic ADLs. She does fluctuate, centered she has good moments and bad moments. They deny any hallucinations. Reports that long-term memory is still good. Denies any recent strokes TIAs seizures, no head trauma no recent infections or illnesses. Patient has a long history of alcohol use, per the center in several drinks a night. She has not been drinking in the past few weeks though. She was evaluated by her primary care physician and found for B12 of 182. Started on oral B12 for this. Had a head CT in the past few months which showed generalized atrophy but otherwise unremarkable. Morgan Weaver notes  that her physical activity has been decline in the past few months.  Review of Systems: Patient complains of symptoms per HPI as well as the following symptoms: No CP, No SOB. Pertinent negatives per HPI. All others negative.   Social History   Socioeconomic History  . Marital status: Married    Spouse name: Not on file  . Number of children: 3  . Years of education: 37  . Highest education level: Not on file  Occupational History  . Occupation: Retired   Scientific laboratory technician  . Financial resource strain: Not on file  . Food insecurity:    Worry: Not on file    Inability: Not on file  . Transportation needs:    Medical: Not on file    Non-medical: Not on file  Tobacco Use  . Smoking status: Former Research scientist (life sciences)  . Smokeless tobacco: Never Used  . Tobacco comment: quit-1977  Substance and Sexual Activity  . Alcohol use: Yes    Alcohol/week: 8.4 oz    Types: 14 Glasses of wine per week    Comment: BEFORE DINNER  . Drug use: No  . Sexual activity: Never  Lifestyle  . Physical activity:    Days per week: Not on file    Minutes per session: Not on file  . Stress: Not on file  Relationships  . Social connections:    Talks on phone: Not on file    Gets together: Not on file    Attends religious service: Not on file    Active member of club or organization: Not on file    Attends meetings of clubs or organizations: Not on file    Relationship status: Not on file  . Intimate partner violence:    Fear of current or ex partner: Not on file    Emotionally abused: Not on file    Physically abused: Not on file    Forced sexual activity: Not on file  Other Topics Concern  . 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 Heart disease  Father        heart attack  . Hypertension Father   . Other Morgan Weaver        brain tumor     Past Medical History:  Diagnosis Date  . Arthritis   . Hemorrhoid   . Hyperlipidemia   . Hypertension   . Lyme disease   . Ovarian cyst   . Thyroid disease    hypothyroidism  . Torn rotator cuff   . Weight decrease     Past Surgical History:  Procedure Laterality Date  . ABDOMINAL HYSTERECTOMY  1970  . APPENDECTOMY  1952  . HIP ARTHROPLASTY Left 03/16/2013   bipolar        Dr Wendee Beavers  . HIP ARTHROPLASTY Left 03/17/2013   Procedure: ARTHROPLASTY BIPOLAR HIP;  Surgeon: Augustin Schooling, MD;  Location: [facility];  Service: Orthopedics;  Laterality: Left;  . KNEE ARTHROSCOPY    . OOPHORECTOMY  1955   removed due to the ovary having gangrene  . OTHER SURGICAL HISTORY     left foot surgery  . OTHER SURGICAL HISTORY     right foot surgery  . Beltrami   left  . TONSILLECTOMY      Current Outpatient Medications  Medication Sig Dispense Refill  . Cholecalciferol (VITAMIN D-3 PO) Take 1 capsule by mouth daily.    Marland Kitchen GINKGO BILOBA PO Take 1 capsule by mouth daily as needed (memory).    . hydrocerin (EUCERIN) CREA Apply 1 application topically 2 (two) times daily as needed. Apply to affected area as needed 15 minutes after triamcinolone cream 113 g 0  . levothyroxine (SYNTHROID, LEVOTHROID) 125 MCG tablet Take 125 mcg by mouth daily before breakfast.  2  . triamcinolone cream (KENALOG) 0.1 % Apply topically 2 (two) times daily as needed. Apply to affected areas as needed. 30 g 0   No current facility-administered medications for this visit.     Allergies as of 12/31/2017 - Review Complete 08/04/2017  Allergen Reaction Noted  . Exelon [rivastigmine tartrate] Other (See Comments) 03/05/2016  . Aricept [donepezil hcl] Nausea And Vomiting and Other (See Comments) 03/03/2016  . Mobic [meloxicam] Other (See Comments) 11/16/2012  . Namenda [memantine hcl] Nausea And Vomiting  and Other  (See Comments) 03/03/2016  . Tetracyclines & related Swelling 10/17/2010  . Tape Other (See Comments) 03/03/2017  . Ancef [cefazolin] Rash 03/18/2013  . Sulfa drugs cross reactors Other (See Comments) 10/17/2010    Vitals: There were no vitals taken for this visit. Last Weight:  Wt Readings from Last 1 Encounters:  08/04/17 148 lb 2.4 oz (67.2 kg)   Last Height:   Ht Readings from Last 1 Encounters:  08/03/17 5\' 6"  (1.676 m)    Exam: Gen: NAD, conversant, well nourised, well groomed  CV: RRR, no MRG. No Carotid Bruits. No peripheral edema, warm, nontender Eyes: Conjunctivae clear without exudates or hemorrhage  Neuro: Detailed Neurologic Exam  Speech:  Speech is normal; fluent and spontaneous with normal comprehension.  Cognition:   Montreal Cognitive Assessment  12/31/2017 12/23/2015 02/12/2015 08/01/2014 01/29/2014  Visuospatial/ Executive (0/5) 2 3 4 2 3   Naming (0/3) 3 3 2 3 2   Attention: Read list of digits (0/2) 1 2 1 2 2   Attention: Read list of letters (0/1) 1 1 1 1 1   Attention: Serial 7 subtraction starting at 100 (0/3) 0 1 0 2 3  Language: Repeat phrase (0/2) 1 2 2  0 2  Language : Fluency (0/1) 0 0 1 0 1  Abstraction (0/2) 0 1 0 1 2  Delayed Recall (0/5) 0 0 0 0 0  Orientation (0/6) 2 2 2 3 2   Total 10 15 13 14 18   Adjusted Score (based on education) 11 15 13 14 18      The patient is oriented to person, place only   recent and remote memory impaired;   language fluent;   Impaired attention, concentration,   fund of knowledge impaired   Cranial Nerves:  The pupils are equal, round, and reactive to light. Visual fields are full to finger confrontation. Extraocular movements are intact. Trigeminal sensation is intact and the muscles of mastication are normal. The face is symmetric. The palate elevates in the midline. Hearing intact. Voice is normal. Shoulder shrug is normal. The tongue has normal motion without  fasciculations.   Coordination:  Normal finger to nose and heel to shin. Normal rapid alternating movements.   Gait:  Heel-toe and tandem gait are normal.   Motor Observation:  No asymmetry, no atrophy, and no involuntary movements noted. Tone:  Normal muscle tone.   Posture:  Posture is normal. normal erect   Strength:  Strength is V/V in the upper and lower limbs.    Sensation: intact to LT   Reflex Exam:  DTR's:  Deep tendon reflexes in the upper and lower extremities are symmetrical bilaterally.  Toes:  The toes are downgoing bilaterally.  Clonus:  Clonus is absent.      Assessment/Plan:  Ms Kunz is a pleasant 84y/o woman who presents with her Morgan Weaver for follow up evaluation of Dementia. Marland Kitchen MOCA showed decrease from 18 now to 11/30.   1)Cognitive decline 2)B12 deficiency  She did not tolerate Aricept or Namenda or Exelon, they decline trying any further even with patch Continue B12 replacement 1082mcg daily, will check B12 today not sure she is taking it Family need to help more with medications, appears they are more involved now and have hired help Has behavioral issues, refuses to bathe, start zoloft low dose Discussed dementia, ways to manage behavioral problems, resources online and books and support groups increase Zoloft as necessary Call Morgan Weaver with results Morgan Weaver [phone].     Assessment/Plan:    Sarina Ill,  MD  [facility] [address] [place], [place] [address]  Phone [phone] Fax [phone]  A total of 40 minutes was spent face-to-face with this patient. Over half this time was spent on counseling patient on the dementia diagnosis and different diagnostic and therapeutic options available.

## 2017-12-31 NOTE — Patient Instructions (Signed)
Recommendations to prevent or slow progression of cognitive decline:   Exercise You should increase exercise 30 to 45 minutes per day at least 3 days a week although 5 to 7 would be preferred. Any type of exercise (including walking) is acceptable although a recumbent bicycle may be best if you are unsteady. Disease related apathy can be a significant roadblock to exercise and the only way to overcome this is to make it a daily routine and perhaps have a reward at the end (something your loved one loves to eat or drink perhaps) or a personal trainer coming to the home can also be very useful. In general a structured, repetitive schedule is best.   Cardiovascular Health: You should optimize all cardiovascular risk factors (blood pressure, sugar, cholesterol) as vascular disease such as strokes and heart attacks can make memory problems much worse.   Diet: Eating a heart healthy (Mediterranean) diet is also a good idea; fish and poultry instead of red meat, nuts (mostly non-peanuts), vegetables, fruits, olive oil or canola oil (instead of butter), minimal salt (use other spices to flavor foods), whole grain rice, bread, cereal and pasta and wine in moderation.  General Health: Any diseases which effect your body will effect your brain such as a pneumonia, urinary infection, blood clot, heart attack or stroke. Keep contact with your primary care doctor for regular follow ups.  Sleep. A good nights sleep is healthy for the brain. Seven hours is recommended. If you have insomnia or poor sleep habits see the recommendations below  Tips: Structured and consistent daytime and nighttime routine, including regular wake times, bedtimes, and mealtimes, will be important for the patient to avoid confusion. Keeping frequently used items in designated places will help reduce stress from searching. If there are worries about getting lost do not let the patient leave home unaccompanied. They might benefit from wearing an  identification bracelet that will help others assist in finding home if they become lost. Information about nationwide safe return services and other helpful resources may be obtained through the Alzheimer's Association helpline at 1800-541 162 6046.  Finances, Power of Producer, television/film/video Directives: You should consider putting legal safeguards in place with regard to financial and medical decision making. While the spouse always has power of attorney for medical and financial issues in the absence of any form, you should consider what you want in case the spouse / caregiver is no longer around or capable of making decisions.   [place] : http://www.welch.com/.pdf  Or Google "[place]" AND "An Forensic scientist for [employer]  Other States: ApartmentMom.com.ee  The signature on these forms should be notarized.   DRIVING:   Driving only during the day Drive only to familiar Locations Avoid driving during bad weather  If you would like to be tested to see if you are driving safely, [facility] has a Clinical Driving Evaluation. To schedule an appointment call [phone].                RESOURCES:  Memory Loss: Improve your short term memory By Silvio Pate  The Alzheimer's Reading Room http://www.alzheimersreadingroom.com/   The Alzheimer's Compendium http://www.alzcompend.info/  [employer] www.dukefamilysupport.K500091 336-109-8223  Recommended resources for caregivers (All can be purchased on [employer]):  1) A Caregiver's Guide to Dementia: Using Activities and Other Strategies to Prevent, Reduce and Manage Behavioral Symptoms by Osie Bond. [place] and [employer]   2) A Caregiver's Guide to [employer] Dementia by Caleen Essex MS BSN and Jeneen Rinks  Whitworth   3) What If It's Not Alzheimer's?: A Caregiver's Guide to  Dementia by Gary Radin (Author), Lisa Radin (Editor)  3) The 36 hour day by Rabins and Mace  4) Understanding Difficult Behaviors by Robinson, Spencer and White  Online course for helping caregivers reduce stress, guilt and frustration called the Caregivers Helpbook. The website is www.powerfultoolsforcaregivers.org  As a caregiver you are a problem-solver. Problems you face as a caregiver are usually unique to your situation and the way your loved-one's disease manifests itself. The best way to use these books is to look at the Table of Contents and read any chapters of interest or that apply to challenges you are having as a caregiver.  NATIONAL RESOURCES: For more information on neurological disorders or research programs funded by the National Institute of Neurological Disorders and Stroke, contact the Institute's Brain Resources and Information Network (BRAIN) at: BRAIN [address] [place] [address] 1-800-352-9424 (toll-free) www.ninds.nih.gov  Information on dementia is also available from the following organizations: Alzheimer's Disease Education and Referral (ADEAR) Center National Institute on Aging [address] [place] [address] 1-800-438-4380 (toll-free) www.nia.nih.gov/alzheimers  Alzheimer's Association [address] [place] [address] 1-800-272-3900 (toll-free, 24-hour helpline) 1-312-335-5886 (TDD) www.alz.org  Alzheimer's Foundation of America [address] [place] [address] 1-866-232-8484 (toll-free) www.alzfdn.org  Alzheimer's Drug Discovery Foundation [address] [place] [address] 1-212-901-8000 www.alzdiscovery.org  Association for Frontotemporal Degeneration [place] [address] [place] [address] 1-866-507-7222 (toll-free) www.theaftd.org  [facility] [address] [place] [address] 1-800-437-2423  (toll-free) www.brightfocus.org/alzheimers  [facility] [address] [place] [address] 1-310-445-4650 www.jdfaf.org  Lewy Body Dementia Association [address] [place] [address] 1-404-935-6444 1-800-539-9767 (toll-free LBD Caregiver Link) www.lbda.org  National Institute of Mental Health [address] [place] [address] 1-866-415-8051 (toll-free) 1-301-443-8431 (TTY) www.nimh.nih.gov  National Organization for Rare Disorders [address] [place] [address] 1-800-999-NORD (1-800-999-6673) (toll-free) www.rarediseases.org  The Dementias: Hope Through Research was jointly produced by the National Institute of Neurological Disorders and Stroke (NINDS) and the National Institute on Aging (NIA), both part of the National Institutes of Health, the nation's medical research agency--supporting scientific studies that turn discovery into health. NINDS is the nation's leading funder of research on the brain and nervous system. The NINDS mission is to reduce the burden of neurological disease. For more information and resources, visit www.ninds.nih.gov [1] or call 1-800-352-9424. NIA leads the federal government effort conducting and supporting research on aging and the health and well-being of older people. NIA's Alzheimer's Disease Education and Referral (ADEAR) Center offers information and publications on dementia and caregiving for families, caregivers, and professionals. For more information, visit www.nia.nih.gov/alzheimers [2] or call 1-800-438-4380. Also available from NIA are publications and information about Alzheimer's disease as well as the booklets Frontotemporal Disorders: Information for Patients, Families, and Caregivers and Lewy Body Dementia: Information for Patients, Families, and Professionals. Source URL:  http://www.nia.nih.gov/alzheimers/publication/dementias/resources     

## 2018-01-02 DIAGNOSIS — F0391 Unspecified dementia with behavioral disturbance: Secondary | ICD-10-CM | POA: Insufficient documentation

## 2018-01-02 DIAGNOSIS — F03918 Unspecified dementia, unspecified severity, with other behavioral disturbance: Secondary | ICD-10-CM | POA: Insufficient documentation

## 2018-02-06 ENCOUNTER — Emergency Department (HOSPITAL_COMMUNITY)
Admission: EM | Admit: 2018-02-06 | Discharge: 2018-02-06 | Disposition: A | Discharge status: Home / Self Care | Payer: Medicare Other | Attending: Emergency Medicine | Admitting: Emergency Medicine

## 2018-02-06 ENCOUNTER — Other Ambulatory Visit: Payer: Self-pay

## 2018-02-06 ENCOUNTER — Emergency Department (HOSPITAL_COMMUNITY): Payer: Medicare Other

## 2018-02-06 ENCOUNTER — Encounter (HOSPITAL_COMMUNITY): Payer: Self-pay

## 2018-02-06 DIAGNOSIS — Y999 Unspecified external cause status: Secondary | ICD-10-CM | POA: Insufficient documentation

## 2018-02-06 DIAGNOSIS — F0390 Unspecified dementia without behavioral disturbance: Secondary | ICD-10-CM | POA: Diagnosis not present

## 2018-02-06 DIAGNOSIS — Y939 Activity, unspecified: Secondary | ICD-10-CM | POA: Diagnosis not present

## 2018-02-06 DIAGNOSIS — I10 Essential (primary) hypertension: Secondary | ICD-10-CM | POA: Diagnosis not present

## 2018-02-06 DIAGNOSIS — E039 Hypothyroidism, unspecified: Secondary | ICD-10-CM | POA: Diagnosis not present

## 2018-02-06 DIAGNOSIS — Z79899 Other long term (current) drug therapy: Secondary | ICD-10-CM | POA: Diagnosis not present

## 2018-02-06 DIAGNOSIS — Z96642 Presence of left artificial hip joint: Secondary | ICD-10-CM | POA: Diagnosis not present

## 2018-02-06 DIAGNOSIS — Y92000 Kitchen of unspecified non-institutional (private) residence as  the place of occurrence of the external cause: Secondary | ICD-10-CM | POA: Diagnosis not present

## 2018-02-06 DIAGNOSIS — M25552 Pain in left hip: Secondary | ICD-10-CM | POA: Insufficient documentation

## 2018-02-06 DIAGNOSIS — Z87891 Personal history of nicotine dependence: Secondary | ICD-10-CM | POA: Insufficient documentation

## 2018-02-06 DIAGNOSIS — I4891 Unspecified atrial fibrillation: Secondary | ICD-10-CM | POA: Insufficient documentation

## 2018-02-06 DIAGNOSIS — W19XXXA Unspecified fall, initial encounter: Secondary | ICD-10-CM | POA: Insufficient documentation

## 2018-02-06 LAB — BASIC METABOLIC PANEL
ANION GAP: 10 (ref 5–15)
BUN: 10 mg/dL (ref 8–23)
CHLORIDE: 96 mmol/L — AB (ref 98–111)
CO2: 24 mmol/L (ref 22–32)
CREATININE: 1.22 mg/dL — AB (ref 0.44–1.00)
Calcium: 9.6 mg/dL (ref 8.9–10.3)
GFR calc non Af Amer: 39 mL/min — ABNORMAL LOW (ref >60)
GFR, EST AFRICAN AMERICAN: 45 mL/min — AB (ref >60)
Glucose, Bld: 103 mg/dL — ABNORMAL HIGH (ref 70–99)
Potassium: 4.3 mmol/L (ref 3.5–5.1)
SODIUM: 130 mmol/L — AB (ref 135–145)

## 2018-02-06 LAB — TYPE AND SCREEN
ABO/RH(D): O POS
Antibody Screen: NEGATIVE

## 2018-02-06 LAB — CBC WITH DIFFERENTIAL/PLATELET
Abs Immature Granulocytes: 0 10*3/uL (ref 0.0–0.1)
BASOS PCT: 1 %
Basophils Absolute: 0.1 10*3/uL (ref 0.0–0.1)
EOS ABS: 0.1 10*3/uL (ref 0.0–0.7)
Eosinophils Relative: 2 %
HCT: 40.4 % (ref 36.0–46.0)
Hemoglobin: 13.7 g/dL (ref 12.0–15.0)
IMMATURE GRANULOCYTES: 0 %
Lymphocytes Relative: 16 %
Lymphs Abs: 1.1 10*3/uL (ref 0.7–4.0)
MCH: 31.4 pg (ref 26.0–34.0)
MCHC: 33.9 g/dL (ref 30.0–36.0)
MCV: 92.4 fL (ref 78.0–100.0)
Monocytes Absolute: 0.6 10*3/uL (ref 0.1–1.0)
Monocytes Relative: 9 %
NEUTROS ABS: 5.3 10*3/uL (ref 1.7–7.7)
NEUTROS PCT: 72 %
PLATELETS: 193 10*3/uL (ref 150–400)
RBC: 4.37 MIL/uL (ref 3.87–5.11)
RDW: 12.7 % (ref 11.5–15.5)
WBC: 7.2 10*3/uL (ref 4.0–10.5)

## 2018-02-06 LAB — ABO/RH: ABO/RH(D): O POS

## 2018-02-06 LAB — PROTIME-INR
INR: 0.91
PROTHROMBIN TIME: 12.1 s (ref 11.4–15.2)

## 2018-02-06 NOTE — ED Triage Notes (Signed)
Patient was at home in kitchen and experienced dizziness and then fell. Patient has left hip pain and is not on blood thinners.

## 2018-02-06 NOTE — ED Notes (Signed)
Patient transported to X-ray 

## 2018-02-06 NOTE — Discharge Instructions (Addendum)
As discussed, your evaluation today has been largely reassuring.  But, it is important that you monitor your condition carefully, and do not hesitate to return to the ED if you develop new, or concerning changes in your condition. ? ?Otherwise, please follow-up with your physician for appropriate ongoing care. ? ?

## 2018-02-06 NOTE — ED Provider Notes (Signed)
[facility] EMERGENCY DEPARTMENT Provider Note   CSN: [id number] Arrival date & time: 02/06/18  1035     History   Chief Complaint Chief Complaint  Patient presents with  . Near Syncope  . Fall    HPI Morgan Weaver is a 84 y.o. female.  HPI Elderly female presents after episode of falling. She arrives via EMS with reports that the patient may have had lightheadedness, though the patient denies this.  On however, the patient has known memory loss, this may be, getting her history. Patient seems to recall feeling slightly different from normal, and falling. Since the fall, however, she has had pain focally in her left hip. She denies current dizziness or lightheadedness or pain anywhere other than her left hip. This pain is worse with motion of either leg, and she has not been ambulatory since the fall. She reports that she was generally well prior to the event EMS reports the patient was hemodynamically stable in route. Past Medical History:  Diagnosis Date  . Arthritis   . Hemorrhoid   . Hyperlipidemia   . Hypertension   . Lyme disease   . Ovarian cyst   . Syncope    3x  . Thyroid disease    hypothyroidism  . Torn rotator cuff   . Weight decrease     Patient Active Problem List   Diagnosis Date Noted  . Dementia with behavioral disturbance 01/02/2018  . Urticaria 08/04/2017  . Sinus bradycardia   . Syncope 03/03/2017  . Atrial fibrillation with RVR ([place])   . Heart block   . Transient hypotension   . Paroxysmal atrial fibrillation ([facility])   . Sinus pause   . Hypokalemia   . Dementia 02/15/2015  . Memory loss 08/01/2014  . B12 deficiency 08/01/2014  . Hyponatremia 03/18/2013  . Closed left hip fracture ([place]) 03/16/2013  . Thrombocytopenia, unspecified ([place]) 11/17/2012  . Leukopenia 11/17/2012  . Syncope and collapse 11/16/2012  . Acute UTI 11/16/2012  . Hypertension 11/16/2012  . Hypothyroid 11/16/2012  . Rash 11/16/2012  . Pruritus  ani 04/16/2011    Past Surgical History:  Procedure Laterality Date  . ABDOMINAL HYSTERECTOMY  1970  . APPENDECTOMY  1952  . HIP ARTHROPLASTY Left 03/16/2013   bipolar        Dr Wendee Beavers  . HIP ARTHROPLASTY Left 03/17/2013   Procedure: ARTHROPLASTY BIPOLAR HIP;  Surgeon: Augustin Schooling, MD;  Location: [facility];  Service: Orthopedics;  Laterality: Left;  . KNEE ARTHROSCOPY    . OOPHORECTOMY  1955   removed due to the ovary having gangrene  . OTHER SURGICAL HISTORY     left foot surgery  . OTHER SURGICAL HISTORY     right foot surgery  . [place]   left  . TONSILLECTOMY       OB History   None      Home Medications    Prior to Admission medications   Medication Sig Start Date End Date Taking? Authorizing Provider  Cyanocobalamin (VITAMIN B-12 PO) Take 1 tablet by mouth daily.   Yes [provider]  GINKGO BILOBA PO Take 1 capsule by mouth daily as needed (memory).   Yes [provider]  levothyroxine (SYNTHROID, LEVOTHROID) 137 MCG tablet  12/15/17  Yes [provider]  OVER THE COUNTER MEDICATION Vitamins D3   Yes [provider]  sertraline (ZOLOFT) 25 MG tablet Take 1 tablet (25 mg total) by mouth daily. 12/31/17  Yes Melvenia Beam, MD    Family History Family History  Problem Relation Age of Onset  . Heart disease Father        heart attack  . Hypertension Father   . Other Son        brain tumor     Social History Social History   Tobacco Use  . Smoking status: Former Research scientist (life sciences)  . Smokeless tobacco: Never Used  . Tobacco comment: quit-1977  Substance Use Topics  . Alcohol use: Not Currently    Alcohol/week: 14.0 standard drinks    Types: 14 Glasses of wine per week    Comment: BEFORE DINNER  . Drug use: No     Allergies   Exelon [rivastigmine tartrate]; Aricept [donepezil hcl]; Mobic [meloxicam]; Namenda [memantine hcl]; Tetracyclines & related; Tape; Ancef [cefazolin]; and Sulfa drugs cross  reactors   Review of Systems Review of Systems  Unable to perform ROS: Dementia     Physical Exam Updated Vital Signs BP (!) 151/66   Pulse (!) 58   Temp 97.6 F (36.4 C) (Oral)   Resp (!) 21   SpO2 99%   Physical Exam  Constitutional: She is oriented to person, place, and time. She appears well-developed and well-nourished. No distress.  HENT:  Head: Normocephalic and atraumatic.  Eyes: Conjunctivae and EOM are normal.  Cardiovascular: Normal rate and regular rhythm.  Pulmonary/Chest: Effort normal and breath sounds normal. No stridor. No respiratory distress.  Abdominal: She exhibits no distension.  Musculoskeletal: She exhibits no edema.  Tenderness palpation left lateral hip, pain in this area with motion of either leg, the patient cannot elevate the leg from the gurney secondary to pain on the affected side.   Neurological: She is alert and oriented to person, place, and time. No cranial nerve deficit.  Skin: Skin is warm and dry.  Psychiatric: She is slowed and withdrawn. Cognition and memory are impaired.  Nursing note and vitals reviewed.    ED Treatments / Results  Labs (all labs ordered are listed, but only abnormal results are displayed) Labs Reviewed  BASIC METABOLIC PANEL - Abnormal; Notable for the following components:      Result Value   Sodium 130 (*)    Chloride 96 (*)    Glucose, Bld 103 (*)    Creatinine, Ser 1.22 (*)    GFR calc non Af Amer 39 (*)    GFR calc Af Amer 45 (*)    All other components within normal limits  CBC WITH DIFFERENTIAL/PLATELET  PROTIME-INR  TYPE AND SCREEN  ABO/RH    EKG EKG Interpretation  Date/Time:  Sunday February 06 2018 11:16:30 EDT Ventricular Rate:  51 PR Interval:    QRS Duration: 107 QT Interval:  467 QTC Calculation: 431 R Axis:   20 Text Interpretation:  Sinus rhythm Low voltage, extremity leads Minimal ST depression, lateral leads Confirmed by Carmin Muskrat [phone]) on 02/06/2018 11:21:05  AM   Radiology Dg Chest 1 View  Result Date: 02/06/2018 CLINICAL DATA:  Left hip pain EXAM: CHEST  1 VIEW COMPARISON:  08/03/2017 FINDINGS: Normal heart size. Lungs clear. No pneumothorax. No pleural effusion. IMPRESSION: No active cardiopulmonary disease. Electronically Signed   By: Marybelle Killings M.D.   On: 02/06/2018 12:00   Ct Pelvis Wo Contrast  Result Date: 02/06/2018 CLINICAL DATA:  [place] at home in the kitchen.  Left hip pain. EXAM: CT PELVIS WITHOUT CONTRAST TECHNIQUE: Multidetector CT imaging of the pelvis was performed following the standard protocol without  intravenous contrast. COMPARISON:  Radiography same day FINDINGS: Urinary Tract:  Normal Bowel:  Diverticulosis without evidence of diverticulitis. Vascular/Lymphatic: Atherosclerosis without evidence of aneurysm. Reproductive:  Previous hysterectomy.  No pelvic mass. Other:  No free fluid. Musculoskeletal: No evidence of pelvic fracture. No evidence of proximal femoral fracture. The scan does not go all the way through the femoral stem, but there is no suspicion from radiography. The patient does have some degenerative change of the right hip. No evidence of surrounding soft tissue or muscular injury. IMPRESSION: No acute or traumatic finding. No evidence of pelvic or left hip region fracture. No evidence of regional soft tissue injury. Electronically Signed   By: Nelson Chimes M.D.   On: 02/06/2018 13:05   Dg Hip Unilat With Pelvis 2-3 Views Left  Result Date: 02/06/2018 CLINICAL DATA:  Left hip pain after fall. EXAM: DG HIP (WITH OR WITHOUT PELVIS) 2-3V LEFT COMPARISON:  Radiograph March 17, 2013. FINDINGS: Status post left hip arthroplasty. No fracture or dislocation is noted. Right hip is unremarkable. IMPRESSION: No acute abnormality is noted in the left hip. Electronically Signed   By: Marijo Conception, M.D.   On: 02/06/2018 12:00    Procedures Procedures (including critical care time)  Medications Ordered in ED Medications -  No data to display   Initial Impression / Assessment and Plan / ED Course  I have reviewed the triage vital signs and the nursing notes.  Pertinent labs & imaging results that were available during my care of the patient were reviewed by me and considered in my medical decision making (see chart for details).    Exam the patient is awake and alert, similar condition.  When she is now accompanied by 2 sons. On repeat exam she can move her leg, though she has some persistent pain left lateral thigh. Initial x-ray reassuring, but given the persistence of pain, and her frail condition, CT scan will be performed.  3:20 PM Patient is smiling, does not recall falling, though she complains of pain on the left side, she is moving her leg, has been upright, bearing weight, ambulatory, though with some pain when twisting. Given this reassuring development, the patient will be discharged with instructions for home walker use, into the care of her sons. Labs otherwise reassuring, no other evidence for acute new pathology.   Final Clinical Impressions(s) / ED Diagnoses   Final diagnoses:  Fall, initial encounter     Carmin Muskrat, MD 02/06/18 [phone]

## 2018-04-12 ENCOUNTER — Encounter (HOSPITAL_COMMUNITY): Payer: Self-pay | Admitting: *Deleted

## 2018-04-12 ENCOUNTER — Inpatient Hospital Stay (HOSPITAL_COMMUNITY): Payer: Medicare Other

## 2018-04-12 ENCOUNTER — Inpatient Hospital Stay (HOSPITAL_COMMUNITY)
Admission: EM | Admit: 2018-04-12 | Discharge: 2018-04-19 | DRG: 470 | Disposition: A | Discharge status: Skilled Nursing Facility | Payer: Medicare Other | Attending: Family Medicine | Admitting: Family Medicine

## 2018-04-12 ENCOUNTER — Emergency Department (HOSPITAL_COMMUNITY): Payer: Medicare Other

## 2018-04-12 DIAGNOSIS — R296 Repeated falls: Secondary | ICD-10-CM | POA: Diagnosis present

## 2018-04-12 DIAGNOSIS — L8990 Pressure ulcer of unspecified site, unspecified stage: Secondary | ICD-10-CM | POA: Diagnosis present

## 2018-04-12 DIAGNOSIS — Z87891 Personal history of nicotine dependence: Secondary | ICD-10-CM | POA: Diagnosis not present

## 2018-04-12 DIAGNOSIS — G309 Alzheimer's disease, unspecified: Secondary | ICD-10-CM | POA: Diagnosis present

## 2018-04-12 DIAGNOSIS — Z90710 Acquired absence of both cervix and uterus: Secondary | ICD-10-CM | POA: Diagnosis not present

## 2018-04-12 DIAGNOSIS — N183 Chronic kidney disease, stage 3 unspecified: Secondary | ICD-10-CM

## 2018-04-12 DIAGNOSIS — S72001A Fracture of unspecified part of neck of right femur, initial encounter for closed fracture: Principal | ICD-10-CM | POA: Diagnosis present

## 2018-04-12 DIAGNOSIS — I5032 Chronic diastolic (congestive) heart failure: Secondary | ICD-10-CM | POA: Diagnosis present

## 2018-04-12 DIAGNOSIS — D72829 Elevated white blood cell count, unspecified: Secondary | ICD-10-CM | POA: Diagnosis present

## 2018-04-12 DIAGNOSIS — I9581 Postprocedural hypotension: Secondary | ICD-10-CM | POA: Diagnosis not present

## 2018-04-12 DIAGNOSIS — F329 Major depressive disorder, single episode, unspecified: Secondary | ICD-10-CM | POA: Diagnosis present

## 2018-04-12 DIAGNOSIS — E785 Hyperlipidemia, unspecified: Secondary | ICD-10-CM | POA: Diagnosis present

## 2018-04-12 DIAGNOSIS — N179 Acute kidney failure, unspecified: Secondary | ICD-10-CM | POA: Diagnosis not present

## 2018-04-12 DIAGNOSIS — Z01811 Encounter for preprocedural respiratory examination: Secondary | ICD-10-CM

## 2018-04-12 DIAGNOSIS — E039 Hypothyroidism, unspecified: Secondary | ICD-10-CM | POA: Diagnosis present

## 2018-04-12 DIAGNOSIS — Z8249 Family history of ischemic heart disease and other diseases of the circulatory system: Secondary | ICD-10-CM

## 2018-04-12 DIAGNOSIS — D62 Acute posthemorrhagic anemia: Secondary | ICD-10-CM | POA: Diagnosis not present

## 2018-04-12 DIAGNOSIS — S72009A Fracture of unspecified part of neck of unspecified femur, initial encounter for closed fracture: Secondary | ICD-10-CM

## 2018-04-12 DIAGNOSIS — Z96642 Presence of left artificial hip joint: Secondary | ICD-10-CM | POA: Diagnosis present

## 2018-04-12 DIAGNOSIS — I480 Paroxysmal atrial fibrillation: Secondary | ICD-10-CM | POA: Diagnosis present

## 2018-04-12 DIAGNOSIS — Z79899 Other long term (current) drug therapy: Secondary | ICD-10-CM | POA: Diagnosis not present

## 2018-04-12 DIAGNOSIS — T368X5A Adverse effect of other systemic antibiotics, initial encounter: Secondary | ICD-10-CM | POA: Diagnosis not present

## 2018-04-12 DIAGNOSIS — Z79890 Hormone replacement therapy (postmenopausal): Secondary | ICD-10-CM

## 2018-04-12 DIAGNOSIS — F32A Depression, unspecified: Secondary | ICD-10-CM | POA: Insufficient documentation

## 2018-04-12 DIAGNOSIS — M4184 Other forms of scoliosis, thoracic region: Secondary | ICD-10-CM | POA: Diagnosis present

## 2018-04-12 DIAGNOSIS — I130 Hypertensive heart and chronic kidney disease with heart failure and stage 1 through stage 4 chronic kidney disease, or unspecified chronic kidney disease: Secondary | ICD-10-CM | POA: Diagnosis present

## 2018-04-12 DIAGNOSIS — I4891 Unspecified atrial fibrillation: Secondary | ICD-10-CM

## 2018-04-12 DIAGNOSIS — W19XXXA Unspecified fall, initial encounter: Secondary | ICD-10-CM | POA: Diagnosis present

## 2018-04-12 DIAGNOSIS — R339 Retention of urine, unspecified: Secondary | ICD-10-CM | POA: Diagnosis not present

## 2018-04-12 DIAGNOSIS — Z886 Allergy status to analgesic agent status: Secondary | ICD-10-CM

## 2018-04-12 DIAGNOSIS — Z882 Allergy status to sulfonamides status: Secondary | ICD-10-CM

## 2018-04-12 DIAGNOSIS — Z91048 Other nonmedicinal substance allergy status: Secondary | ICD-10-CM

## 2018-04-12 DIAGNOSIS — Z884 Allergy status to anesthetic agent status: Secondary | ICD-10-CM

## 2018-04-12 DIAGNOSIS — F0280 Dementia in other diseases classified elsewhere without behavioral disturbance: Secondary | ICD-10-CM | POA: Diagnosis present

## 2018-04-12 LAB — COMPREHENSIVE METABOLIC PANEL
ALT: 11 U/L (ref 0–44)
AST: 18 U/L (ref 15–41)
Albumin: 4.1 g/dL (ref 3.5–5.0)
Alkaline Phosphatase: 55 U/L (ref 38–126)
Anion gap: 9 (ref 5–15)
BUN: 23 mg/dL (ref 8–23)
CHLORIDE: 102 mmol/L (ref 98–111)
CO2: 24 mmol/L (ref 22–32)
CREATININE: 1.14 mg/dL — AB (ref 0.44–1.00)
Calcium: 9.2 mg/dL (ref 8.9–10.3)
GFR calc Af Amer: 49 mL/min — ABNORMAL LOW (ref >60)
GFR, EST NON AFRICAN AMERICAN: 42 mL/min — AB (ref >60)
Glucose, Bld: 125 mg/dL — ABNORMAL HIGH (ref 70–99)
Potassium: 3.7 mmol/L (ref 3.5–5.1)
Sodium: 135 mmol/L (ref 135–145)
Total Bilirubin: 0.8 mg/dL (ref 0.3–1.2)
Total Protein: 6.9 g/dL (ref 6.5–8.1)

## 2018-04-12 LAB — CBC WITH DIFFERENTIAL/PLATELET
Abs Immature Granulocytes: 0.05 10*3/uL (ref 0.00–0.07)
Basophils Absolute: 0 10*3/uL (ref 0.0–0.1)
Basophils Relative: 0 %
EOS PCT: 1 %
Eosinophils Absolute: 0.1 10*3/uL (ref 0.0–0.5)
HEMATOCRIT: 37.1 % (ref 36.0–46.0)
HEMOGLOBIN: 12.4 g/dL (ref 12.0–15.0)
Immature Granulocytes: 1 %
LYMPHS ABS: 1.5 10*3/uL (ref 0.7–4.0)
LYMPHS PCT: 13 %
MCH: 30.9 pg (ref 26.0–34.0)
MCHC: 33.4 g/dL (ref 30.0–36.0)
MCV: 92.5 fL (ref 80.0–100.0)
MONO ABS: 0.7 10*3/uL (ref 0.1–1.0)
Monocytes Relative: 6 %
Neutro Abs: 8.8 10*3/uL — ABNORMAL HIGH (ref 1.7–7.7)
Neutrophils Relative %: 79 %
Platelets: 189 10*3/uL (ref 150–400)
RBC: 4.01 MIL/uL (ref 3.87–5.11)
RDW: 12.6 % (ref 11.5–15.5)
WBC: 11.1 10*3/uL — AB (ref 4.0–10.5)
nRBC: 0 % (ref 0.0–0.2)

## 2018-04-12 LAB — TROPONIN I: Troponin I: <0.03 ng/mL (ref <0.03)

## 2018-04-12 MED ORDER — SODIUM CHLORIDE 0.9 % IV SOLN
INTRAVENOUS | Status: DC
  Administered 2018-04-12: 18:00:00 via INTRAVENOUS

## 2018-04-12 MED ORDER — FENTANYL CITRATE (PF) 100 MCG/2ML IJ SOLN
50.0000 ug | Freq: Once | INTRAMUSCULAR | Status: AC
  Administered 2018-04-12: 50 ug via INTRAVENOUS
  Filled 2018-04-12: qty 2

## 2018-04-12 MED ORDER — FENTANYL CITRATE (PF) 100 MCG/2ML IJ SOLN
25.0000 ug | Freq: Once | INTRAMUSCULAR | Status: AC
  Administered 2018-04-12: 25 ug via INTRAVENOUS
  Filled 2018-04-12: qty 2

## 2018-04-12 MED ORDER — HYDROCODONE-ACETAMINOPHEN 5-325 MG PO TABS
1.0000 | ORAL_TABLET | Freq: Four times a day (QID) | ORAL | Status: DC | PRN
  Administered 2018-04-13 – 2018-04-14 (×3): 1 via ORAL
  Administered 2018-04-16: 2 via ORAL
  Administered 2018-04-17 – 2018-04-18 (×2): 1 via ORAL
  Filled 2018-04-12 (×2): qty 1
  Filled 2018-04-12: qty 2
  Filled 2018-04-12 (×3): qty 1

## 2018-04-12 MED ORDER — SERTRALINE HCL 25 MG PO TABS
25.0000 mg | ORAL_TABLET | Freq: Every day | ORAL | Status: DC
  Administered 2018-04-14 – 2018-04-19 (×6): 25 mg via ORAL
  Filled 2018-04-12 (×6): qty 1

## 2018-04-12 MED ORDER — MORPHINE SULFATE (PF) 2 MG/ML IV SOLN
0.5000 mg | INTRAVENOUS | Status: DC | PRN
  Administered 2018-04-12 – 2018-04-13 (×2): 0.5 mg via INTRAVENOUS
  Filled 2018-04-12 (×2): qty 1

## 2018-04-12 MED ORDER — LEVOTHYROXINE SODIUM 25 MCG PO TABS
137.0000 ug | ORAL_TABLET | Freq: Every day | ORAL | Status: DC
  Administered 2018-04-13 – 2018-04-19 (×7): 137 ug via ORAL
  Filled 2018-04-12 (×8): qty 1

## 2018-04-12 MED ORDER — HEPARIN SODIUM (PORCINE) 5000 UNIT/ML IJ SOLN
5000.0000 [IU] | Freq: Three times a day (TID) | INTRAMUSCULAR | Status: AC
  Administered 2018-04-12: 5000 [IU] via SUBCUTANEOUS
  Filled 2018-04-12 (×2): qty 1

## 2018-04-12 MED ORDER — ONDANSETRON HCL 4 MG/2ML IJ SOLN
4.0000 mg | Freq: Once | INTRAMUSCULAR | Status: AC
  Administered 2018-04-12: 4 mg via INTRAVENOUS
  Filled 2018-04-12: qty 2

## 2018-04-12 MED ORDER — LORAZEPAM 2 MG/ML IJ SOLN
0.5000 mg | Freq: Once | INTRAMUSCULAR | Status: AC
  Administered 2018-04-12: 0.5 mg via INTRAVENOUS
  Filled 2018-04-12: qty 1

## 2018-04-12 MED ORDER — SODIUM CHLORIDE 0.9 % IV SOLN
INTRAVENOUS | Status: AC
  Administered 2018-04-12: 20:00:00 via INTRAVENOUS

## 2018-04-12 NOTE — ED Notes (Signed)
Pt placed on Purewick and 2L O2 via nasal canula.

## 2018-04-12 NOTE — H&P (Signed)
History and Physical    Morgan Weaver PJK:[id number] DOB: 04-Jun-1929 DOA: 04/12/2018  PCP: Leonard Downing, MD Patient coming from: Home  Chief Complaint: Fall  HPI: Morgan Weaver is a 84 y.o. female with medical history significant of hypertension, hyperlipidemia, hypothyroidism, A. fib, CKD 3 presenting to the hospital after a fall.  Patient is oriented to person only and seemed confused.  Family at bedside states patient has Alzheimer's dementia and is oriented to person and place normally.  They believe her orientation was at baseline until she received pain medication in the ED.  Family states that the patient was talking to someone on the phone today and went to grab something to write with.  The person on the phone heard the patient scream and they called EMS for help.  Family states she has had several syncopal episodes in the past but did not have loss of consciousness this time.  States she is supposed to be using a walker but was not using it today.  Patient denies having any hip pain.  Denies having any chest pain, shortness of breath, or dizziness.  ED Course: Afebrile.  Pulse in the 50s to 60s.  Blood pressure 152/72.  Satting well on room air.  White count 11.1.  Creatinine 1.1, at baseline.  X-ray showing fracture of the right femoral neck.  Patient received fentanyl, Ativan, and Zofran in the ED.  ED physician spoke to Dr. Griffin Basil from orthopedics.  He is requesting for the patient to be transferred to [facility] with plan to operate in the morning.  TRH paged to admit.  Review of Systems: As per HPI otherwise 10 point review of systems negative.  Past Medical History:  Diagnosis Date  . Arthritis   . Hemorrhoid   . Hyperlipidemia   . Hypertension   . Lyme disease   . Ovarian cyst   . Syncope    3x  . Thyroid disease    hypothyroidism  . Torn rotator cuff   . Weight decrease     Past Surgical History:  Procedure Laterality Date  . ABDOMINAL HYSTERECTOMY  1970   . APPENDECTOMY  1952  . HIP ARTHROPLASTY Left 03/16/2013   bipolar        Dr Wendee Beavers  . HIP ARTHROPLASTY Left 03/17/2013   Procedure: ARTHROPLASTY BIPOLAR HIP;  Surgeon: Augustin Schooling, MD;  Location: [place];  Service: Orthopedics;  Laterality: Left;  . KNEE ARTHROSCOPY    . OOPHORECTOMY  1955   removed due to the ovary having gangrene  . OTHER SURGICAL HISTORY     left foot surgery  . OTHER SURGICAL HISTORY     right foot surgery  . [place]   left  . TONSILLECTOMY       reports that she has quit smoking. She has never used smokeless tobacco. She reports that she drank about 14.0 standard drinks of alcohol per week. She reports that she does not use drugs.  Allergies  Allergen Reactions  . Exelon [Rivastigmine Tartrate] Other (See Comments)    Asystole/sinus pauses/syncope  . Aricept [Donepezil Hcl] Nausea And Vomiting and Other (See Comments)    Irregular heartbeat also  . Mobic [Meloxicam] Other (See Comments)    Abdominal pain, GI ulcer (?)   . Namenda [Memantine Hcl] Nausea And Vomiting and Other (See Comments)    Irregular heartbeat also  . Tetracyclines & Related Swelling    Lips swell   . Tape Other (See  Comments)    TAPE BRUISES THE SKIN (IT IS VERY THIN)  . Ancef [Cefazolin] Rash    Diffuse rash with urticaria, no respiratory involvement  . Sulfa Drugs Cross Reactors Other (See Comments)    Reaction not recalled by the patient    Family History  Problem Relation Age of Onset  . Heart disease Father        heart attack  . Hypertension Father   . Other Son        brain tumor     Prior to Admission medications   Medication Sig Start Date End Date Taking? Authorizing Provider  Cyanocobalamin (VITAMIN B-12 PO) Take 1 tablet by mouth daily.   Yes [provider]  GINKGO BILOBA PO Take 1 capsule by mouth daily as needed (memory).   Yes [provider]  levothyroxine (SYNTHROID, LEVOTHROID) 137 MCG tablet Take 137 mcg by mouth  daily before breakfast.  12/15/17  Yes [provider]  OVER THE COUNTER MEDICATION Take 1 tablet by mouth daily. Vitamins D3   Yes [provider]  sertraline (ZOLOFT) 25 MG tablet Take 1 tablet (25 mg total) by mouth daily. 12/31/17  Yes Melvenia Beam, MD    Physical Exam: Vitals:   04/12/18 1815 04/12/18 1830 04/12/18 1852 04/12/18 1900  BP:   (!) 169/79 (!) 156/77  Pulse: (!) 59 (!) 58 65 68  Resp: 20 19 17 10   Temp:      SpO2: 100% 99% 100% (!) 75%   Physical Exam  Constitutional: No distress.  Resting comfortably in a hospital stretcher.  HENT:  Head: Normocephalic.  Mouth/Throat: Oropharynx is clear and moist.  Eyes: Right eye exhibits no discharge. Left eye exhibits no discharge.  Neck: Neck supple. No tracheal deviation present.  Cardiovascular: Normal rate, regular rhythm and intact distal pulses.  Pulmonary/Chest: Effort normal. No respiratory distress. She has no wheezes. She has no rales.  Anterior lung feels clear to auscultation.  Abdominal: Soft. Bowel sounds are normal. She exhibits no distension. There is no tenderness.  Musculoskeletal: She exhibits no edema.  Right lower extremity appears shorter than the left lower extremity.  Rotated outwards.  Neurological:  Awake and alert.  Oriented to person only.  Skin: Skin is warm and dry. She is not diaphoretic.  Psychiatric: She has a normal mood and affect.     Labs on Admission: I have personally reviewed following labs and imaging studies  CBC: Recent Labs  Lab 04/12/18 1744  WBC 11.1*  NEUTROABS 8.8*  HGB 12.4  HCT 37.1  MCV 92.5  PLT 366   Basic Metabolic Panel: Recent Labs  Lab 04/12/18 1744  NA 135  K 3.7  CL 102  CO2 24  GLUCOSE 125*  BUN 23  CREATININE 1.14*  CALCIUM 9.2   GFR: CrCl cannot be calculated (Unknown ideal weight.). Liver Function Tests: Recent Labs  Lab 04/12/18 1744  AST 18  ALT 11  ALKPHOS 55  BILITOT 0.8  PROT 6.9  ALBUMIN 4.1   No  results for input(s): LIPASE, AMYLASE in the last 168 hours. No results for input(s): AMMONIA in the last 168 hours. Coagulation Profile: No results for input(s): INR, PROTIME in the last 168 hours. Cardiac Enzymes: No results for input(s): CKTOTAL, CKMB, CKMBINDEX, TROPONINI in the last 168 hours. BNP (last 3 results) No results for input(s): PROBNP in the last 8760 hours. HbA1C: No results for input(s): HGBA1C in the last 72 hours. CBG: No results for input(s): GLUCAP  in the last 168 hours. Lipid Profile: No results for input(s): CHOL, HDL, LDLCALC, TRIG, CHOLHDL, LDLDIRECT in the last 72 hours. Thyroid Function Tests: No results for input(s): TSH, T4TOTAL, FREET4, T3FREE, THYROIDAB in the last 72 hours. Anemia Panel: No results for input(s): VITAMINB12, FOLATE, FERRITIN, TIBC, IRON, RETICCTPCT in the last 72 hours. Urine analysis:    Component Value Date/Time   COLORURINE YELLOW 08/04/2017 0308   APPEARANCEUR HAZY (A) 08/04/2017 0308   LABSPEC 1.026 08/04/2017 0308   PHURINE 5.0 08/04/2017 0308   GLUCOSEU NEGATIVE 08/04/2017 0308   HGBUR SMALL (A) 08/04/2017 0308   BILIRUBINUR NEGATIVE 08/04/2017 0308   KETONESUR NEGATIVE 08/04/2017 0308   PROTEINUR NEGATIVE 08/04/2017 0308   UROBILINOGEN 0.2 03/16/2013 2103   NITRITE NEGATIVE 08/04/2017 0308   LEUKOCYTESUR SMALL (A) 08/04/2017 0308    Radiological Exams on Admission: Dg Hip Unilat  With Pelvis 2-3 Views Right  Result Date: 04/12/2018 CLINICAL DATA:  Status post fall EXAM: DG HIP (WITH OR WITHOUT PELVIS) 2-3V RIGHT COMPARISON:  None. FINDINGS: There is displaced fracture of the right femoral neck. Left hip replacement is identified without malalignment. IMPRESSION: Fracture of the right femoral neck. Electronically Signed   By: Abelardo Diesel M.D.   On: 04/12/2018 18:24    EKG: Independently reviewed.  Sinus rhythm with heart rate 65.  Assessment/Plan Principal Problem:   Femoral neck fracture (HCC) Active Problems:    Hypertension   Hypothyroid   Chronic diastolic CHF (congestive heart failure) (HCC)   CKD (chronic kidney disease) stage 3, GFR 30-59 ml/min (HCC)   Depression   Atrial fibrillation (HCC)   Right femoral neck fracture secondary to fall, likely mechanical -Patient did not lose consciousness per family at bedside. -Afebrile, not hypotensive. Satting well on room air.  White count 11.1.  X-ray showing fracture of the right femoral neck.  ED physician spoke to Dr. Griffin Basil from orthopedics.  He is requesting for the patient to be transferred to [facility] with plan to operate in the morning. -Mild leukocytosis likely reactive.  Patient is afebrile.  Check x-ray and urinalysis.  Repeat CBC in a.m. -EKG not suggestive of ACS.  Check troponin. -Keep n.p.o. after midnight -Pain management: Norco 1 to 2 tablets every 6 hours as needed, morphine 0.5 mg every 2 hours as needed for severe pain -Maintenance IV fluid -Monitor on telemetry  Chronic diastolic congestive heart failure -Echo done in September 2018 showing normal systolic function (EF 60 to 65%) and grade 1 diastolic dysfunction.  Currently not volume overloaded on exam.  CKD 3 -Stable.  Creatinine 1.1, at baseline.   -BMP in a.m.  Hypertension -Currently not on any home medications.  Consider starting a medication if patient's blood pressure continues to be elevated during this hospitalization.  Hypothyroidism -Continue home Synthroid 137 mcg daily  Depression -Continue home Zoloft 25 mg daily  A. Fib -Currently in sinus rhythm, rate controlled. CHA2DS2VASc 5.  Patient is not on a rate control agent.  She is not on anticoagulation likely due to history of recurrent falls/syncopal episodes.  DVT prophylaxis: Subcutaneous heparin; hold dose 8 hours prior to surgery Code Status: Patient has baseline Alzheimer's dementia.  At present, she is oriented to person only after receiving pain medications.  Sons at bedside want the patient to  be full code. Family Communication: Sons at bedside updated.   Disposition Plan: Anticipate discharge to home versus SNF in 2 to 3 days. Consults called: Orthopedics-Dr. Griffin Basil Admission status: It is my clinical opinion  that admission to INPATIENT is reasonable and necessary in this 84 y.o. female . presenting with symptoms of right hip pain, concerning for right femoral neck fracture . in the context of PMH including: Alzheimer's dementia, history of recurrent falls/syncope . with pertinent positives on physical exam including: Right lower extremity shorter than left lower extremity; rotated outwards . and pertinent positives on radiographic and laboratory data including: Right femoral neck fracture . Workup and treatment include patient will be transferred to: Hospital with plan to undergo surgery tomorrow morning.  She is on IV fluids and IV pain medication.  She will need physical therapy evaluation after her surgery.  Given the aforementioned, the predictability of an adverse outcome is felt to be significant. I expect that the patient will require at least 2 midnights in the hospital to treat this condition.    Shela Leff MD [facility] Pager [phone]  If 7PM-7AM, please contact night-coverage [email or web address] Password [facility]  04/12/2018, 8:23 PM

## 2018-04-12 NOTE — ED Notes (Signed)
Bed: WA03 Expected date:  Expected time:  Means of arrival:  Comments: EMS hip

## 2018-04-12 NOTE — ED Provider Notes (Signed)
[place] DEPT Provider Note   CSN: [id number] Arrival date & time: 04/12/18  1540     History   Chief Complaint Chief Complaint  Patient presents with  . Fall    HPI Morgan Weaver is a 84 y.o. female.  HPI   She presents for evaluation of injury from fall.  She was at home when she was talking on the phone to a short agent, who heard the patient fall and cry for help.  The agent called EMS who came to find the patient on the floor complaining of right hip pain.  Patient is unable to give specific history.  She is here with her son who helps to give history.  He reports that she has had multiple syncopal episodes, been evaluated, multiple times.  Recently she is apparently been doing well.  There are no other known modifying factors.  Level 5 caveat-dementia  Past Medical History:  Diagnosis Date  . Arthritis   . Hemorrhoid   . Hyperlipidemia   . Hypertension   . Lyme disease   . Ovarian cyst   . Syncope    3x  . Thyroid disease    hypothyroidism  . Torn rotator cuff   . Weight decrease     Patient Active Problem List   Diagnosis Date Noted  . Dementia with behavioral disturbance ([place]) 01/02/2018  . Urticaria 08/04/2017  . Sinus bradycardia   . Syncope 03/03/2017  . Atrial fibrillation with RVR ([place])   . Heart block   . Transient hypotension   . Paroxysmal atrial fibrillation (HCC)   . Sinus pause   . Hypokalemia   . Dementia ([place]) 02/15/2015  . Memory loss 08/01/2014  . B12 deficiency 08/01/2014  . Hyponatremia 03/18/2013  . Closed left hip fracture ([place]) 03/16/2013  . Thrombocytopenia, unspecified ([place]) 11/17/2012  . Leukopenia 11/17/2012  . Syncope and collapse 11/16/2012  . Acute UTI 11/16/2012  . Hypertension 11/16/2012  . Hypothyroid 11/16/2012  . Rash 11/16/2012  . Pruritus ani 04/16/2011    Past Surgical History:  Procedure Laterality Date  . ABDOMINAL HYSTERECTOMY  1970  . APPENDECTOMY  1952  . HIP  ARTHROPLASTY Left 03/16/2013   bipolar        Dr Wendee Beavers  . HIP ARTHROPLASTY Left 03/17/2013   Procedure: ARTHROPLASTY BIPOLAR HIP;  Surgeon: Augustin Schooling, MD;  Location: [place];  Service: Orthopedics;  Laterality: Left;  . KNEE ARTHROSCOPY    . OOPHORECTOMY  1955   removed due to the ovary having gangrene  . OTHER SURGICAL HISTORY     left foot surgery  . OTHER SURGICAL HISTORY     right foot surgery  . [place]   left  . TONSILLECTOMY       OB History   None      Home Medications    Prior to Admission medications   Medication Sig Start Date End Date Taking? Authorizing Provider  Cyanocobalamin (VITAMIN B-12 PO) Take 1 tablet by mouth daily.   Yes [provider]  GINKGO BILOBA PO Take 1 capsule by mouth daily as needed (memory).   Yes [provider]  levothyroxine (SYNTHROID, LEVOTHROID) 137 MCG tablet Take 137 mcg by mouth daily before breakfast.  12/15/17  Yes [provider]  OVER THE COUNTER MEDICATION Take 1 tablet by mouth daily. Vitamins D3   Yes [provider]  sertraline (ZOLOFT) 25 MG tablet Take 1 tablet (25 mg total) by  mouth daily. 12/31/17  Yes Melvenia Beam, MD    Family History Family History  Problem Relation Age of Onset  . Heart disease Father        heart attack  . Hypertension Father   . Other Son        brain tumor     Social History Social History   Tobacco Use  . Smoking status: Former Research scientist (life sciences)  . Smokeless tobacco: Never Used  . Tobacco comment: quit-1977  Substance Use Topics  . Alcohol use: Not Currently    Alcohol/week: 14.0 standard drinks    Types: 14 Glasses of wine per week    Comment: BEFORE DINNER  . Drug use: No     Allergies   Exelon [rivastigmine tartrate]; Aricept [donepezil hcl]; Mobic [meloxicam]; Namenda [memantine hcl]; Tetracyclines & related; Tape; Ancef [cefazolin]; and Sulfa drugs cross reactors   Review of Systems Review of Systems  Unable to perform  ROS: Dementia     Physical Exam Updated Vital Signs BP (!) 169/79   Pulse 65   Temp 97.9 F (36.6 C)   Resp 17   SpO2 100%   Physical Exam  Constitutional: She appears well-developed.  [place], frail  HENT:  Head: Normocephalic and atraumatic.  Eyes: Pupils are equal, round, and reactive to light. Conjunctivae and EOM are normal.  Neck: Normal range of motion and phonation normal. Neck supple.  Cardiovascular: Normal rate and regular rhythm.  Pulmonary/Chest: Effort normal and breath sounds normal. She exhibits no tenderness.  Abdominal: Soft. She exhibits no distension. There is no tenderness. There is no guarding.  Musculoskeletal: Normal range of motion.  Right leg shortened.  Moderate right hip pain with palpation and movement.  No gross right hip deformity.  Normal range of motion arms and left leg.  No rib tenderness.  Neurological: She is alert. She exhibits normal muscle tone.  Skin: Skin is warm and dry.  Psychiatric: She has a normal mood and affect. Her behavior is normal.  Nursing note and vitals reviewed.    ED Treatments / Results  Labs (all labs ordered are listed, but only abnormal results are displayed) Labs Reviewed  COMPREHENSIVE METABOLIC PANEL - Abnormal; Notable for the following components:      Result Value   Glucose, Bld 125 (*)    Creatinine, Ser 1.14 (*)    GFR calc non Af Amer 42 (*)    GFR calc Af Amer 49 (*)    All other components within normal limits  CBC WITH DIFFERENTIAL/PLATELET - Abnormal; Notable for the following components:   WBC 11.1 (*)    Neutro Abs 8.8 (*)    All other components within normal limits    EKG EKG Interpretation  Date/Time:  Tuesday April 12 2018 17:33:42 EDT Ventricular Rate:  65 PR Interval:    QRS Duration: 96 QT Interval:  403 QTC Calculation: 419 R Axis:   0 Text Interpretation:  Sinus rhythm Low voltage, precordial leads Baseline wander in lead(s) V3 since last tracing no significant change  Confirmed by Daleen Bo [phone]) on 04/12/2018 7:21:15 PM   Radiology Dg Hip Unilat  With Pelvis 2-3 Views Right  Result Date: 04/12/2018 CLINICAL DATA:  Status post fall EXAM: DG HIP (WITH OR WITHOUT PELVIS) 2-3V RIGHT COMPARISON:  None. FINDINGS: There is displaced fracture of the right femoral neck. Left hip replacement is identified without malalignment. IMPRESSION: Fracture of the right femoral neck. Electronically Signed   By: Mallie Darting.D.  On: 04/12/2018 18:24    Procedures Procedures (including critical care time)  Medications Ordered in ED Medications  0.9 %  sodium chloride infusion ( Intravenous New Bag/Given 04/12/18 1742)  ondansetron (ZOFRAN) injection 4 mg (4 mg Intravenous Given 04/12/18 1734)  fentaNYL (SUBLIMAZE) injection 25 mcg (25 mcg Intravenous Given 04/12/18 1736)  fentaNYL (SUBLIMAZE) injection 50 mcg (50 mcg Intravenous Given 04/12/18 1841)  LORazepam (ATIVAN) injection 0.5 mg (0.5 mg Intravenous Given 04/12/18 1842)     Initial Impression / Assessment and Plan / ED Course  I have reviewed the triage vital signs and the nursing notes.  Pertinent labs & imaging results that were available during my care of the patient were reviewed by me and considered in my medical decision making (see chart for details).  Clinical Course as of Apr 12 1925  Tue Apr 12, 2018  1859 Test with orthopedics, Dr. Griffin Basil, he will see the patient as a consultant and plans on operative management tomorrow, at [facility].   [EW]  1923 normal  Comprehensive metabolic panel(!) [EW]  8502 Normal except WBC high  CBC with Differential(!) [EW]    Clinical Course User Index [EW] Daleen Bo, MD     Patient Vitals for the past 24 hrs:  BP Temp Pulse Resp SpO2  04/12/18 1852 (!) 169/79 - 65 17 100 %  04/12/18 1830 - - (!) 58 19 99 %  04/12/18 1815 - - (!) 59 20 100 %  04/12/18 1800 - - (!) 57 (!) 25 98 %  04/12/18 1745 - - 60 (!) 22 100 %  04/12/18 1730 - - (!)  51 - 100 %  04/12/18 1715 - - 64 - 95 %  04/12/18 1700 (!) 169/78 - - - -  04/12/18 1620 - - (!) 55 - 98 %  04/12/18 1602 (!) 152/72 97.9 F (36.6 C) (!) 55 16 95 %  04/12/18 1546 - - - - 97 %    7:26 PM Reevaluation with update and discussion. After initial assessment and treatment, an updated evaluation reveals she is more comfortable and has no further complaints.  Family members updated on findings and plan. Daleen Bo   Medical Decision Making: Fall with right hip fracture, suspect mechanical fall.  Screening labs are reassuring.  She is hemodynamically stable.  She will require admission for stabilization, medical clearance and surgical repair of right hip fracture.  CRITICAL CARE- no Performed by: Daleen Bo   Nursing Notes Reviewed/ Care Coordinated Applicable Imaging Reviewed Interpretation of Laboratory Data incorporated into ED treatment   6:50 PM-Consult complete with hospitalist. Patient case explained and discussed.  She agrees to admit patient for further evaluation and treatment. Call ended at 7:25 PM    Final Clinical Impressions(s) / ED Diagnoses   Final diagnoses:  Closed fracture of right hip, initial encounter [facility])  Fall, initial encounter    ED Discharge Orders    None       Daleen Bo, MD 04/12/18 1934

## 2018-04-12 NOTE — ED Notes (Signed)
[facility] contacted for transport to [facility] 

## 2018-04-12 NOTE — ED Triage Notes (Signed)
Pt BIB [facility] from home. Pt fell while getting out of her recliner. Pt has shortening and rotation of right leg (PMS in tact)and pain at right hip. No blood thinners, no trauma to head, denies LOC. Neck stabilized due to alzheimer's. Skin tear on right elbow. 150 mcg of fentanyl and 4mg  zofran administered en route.

## 2018-04-12 NOTE — ED Notes (Signed)
ED Provider at bedside. 

## 2018-04-13 ENCOUNTER — Other Ambulatory Visit: Payer: Self-pay

## 2018-04-13 ENCOUNTER — Inpatient Hospital Stay (HOSPITAL_COMMUNITY): Payer: Medicare Other | Admitting: Registered Nurse

## 2018-04-13 ENCOUNTER — Encounter (HOSPITAL_COMMUNITY)
Admission: EM | Disposition: A | Discharge status: Skilled Nursing Facility | Payer: Self-pay | Source: Home / Self Care | Attending: Family Medicine

## 2018-04-13 ENCOUNTER — Encounter (HOSPITAL_COMMUNITY): Payer: Self-pay | Admitting: Surgery

## 2018-04-13 ENCOUNTER — Inpatient Hospital Stay (HOSPITAL_COMMUNITY): Payer: Medicare Other

## 2018-04-13 DIAGNOSIS — I480 Paroxysmal atrial fibrillation: Secondary | ICD-10-CM

## 2018-04-13 DIAGNOSIS — E039 Hypothyroidism, unspecified: Secondary | ICD-10-CM

## 2018-04-13 DIAGNOSIS — L8990 Pressure ulcer of unspecified site, unspecified stage: Secondary | ICD-10-CM

## 2018-04-13 DIAGNOSIS — N183 Chronic kidney disease, stage 3 (moderate): Secondary | ICD-10-CM

## 2018-04-13 DIAGNOSIS — S72001A Fracture of unspecified part of neck of right femur, initial encounter for closed fracture: Principal | ICD-10-CM

## 2018-04-13 HISTORY — PX: HIP ARTHROPLASTY: SHX981

## 2018-04-13 LAB — MRSA PCR SCREENING: MRSA BY PCR: NEGATIVE

## 2018-04-13 SURGERY — HEMIARTHROPLASTY, HIP, DIRECT ANTERIOR APPROACH, FOR FRACTURE
Anesthesia: Spinal | Site: Hip | Laterality: Right

## 2018-04-13 MED ORDER — TRANEXAMIC ACID-NACL 1000-0.7 MG/100ML-% IV SOLN
1000.0000 mg | INTRAVENOUS | Status: DC
  Filled 2018-04-13: qty 100

## 2018-04-13 MED ORDER — DEXAMETHASONE SODIUM PHOSPHATE 10 MG/ML IJ SOLN
INTRAMUSCULAR | Status: AC
  Filled 2018-04-13: qty 1

## 2018-04-13 MED ORDER — FENTANYL CITRATE (PF) 250 MCG/5ML IJ SOLN
INTRAMUSCULAR | Status: AC
  Filled 2018-04-13: qty 5

## 2018-04-13 MED ORDER — PROPOFOL 500 MG/50ML IV EMUL
INTRAVENOUS | Status: DC | PRN
  Administered 2018-04-13: 35 ug/kg/min via INTRAVENOUS

## 2018-04-13 MED ORDER — DIPHENHYDRAMINE HCL 50 MG/ML IJ SOLN
INTRAMUSCULAR | Status: AC
  Filled 2018-04-13: qty 1

## 2018-04-13 MED ORDER — CHLORHEXIDINE GLUCONATE 4 % EX LIQD
60.0000 mL | Freq: Once | CUTANEOUS | Status: AC
  Administered 2018-04-13: 4 via TOPICAL

## 2018-04-13 MED ORDER — PHENYLEPHRINE 40 MCG/ML (10ML) SYRINGE FOR IV PUSH (FOR BLOOD PRESSURE SUPPORT)
PREFILLED_SYRINGE | INTRAVENOUS | Status: DC | PRN
  Administered 2018-04-13 (×3): 80 ug via INTRAVENOUS

## 2018-04-13 MED ORDER — MIDAZOLAM HCL 2 MG/2ML IJ SOLN
INTRAMUSCULAR | Status: AC
  Filled 2018-04-13: qty 2

## 2018-04-13 MED ORDER — METOCLOPRAMIDE HCL 5 MG/ML IJ SOLN: 10.0000 mg | Freq: Once | INTRAMUSCULAR | Status: DC | PRN

## 2018-04-13 MED ORDER — MIDAZOLAM HCL 2 MG/2ML IJ SOLN
INTRAMUSCULAR | Status: DC | PRN
  Administered 2018-04-13: 1 mg via INTRAVENOUS

## 2018-04-13 MED ORDER — MEPERIDINE HCL 50 MG/ML IJ SOLN: 6.2500 mg | INTRAMUSCULAR | Status: DC | PRN

## 2018-04-13 MED ORDER — SODIUM CHLORIDE 0.9 % IV SOLN
INTRAVENOUS | Status: DC | PRN
  Administered 2018-04-13: 20 ug/min via INTRAVENOUS

## 2018-04-13 MED ORDER — FENTANYL CITRATE (PF) 100 MCG/2ML IJ SOLN
INTRAMUSCULAR | Status: DC | PRN
  Administered 2018-04-13: 25 ug via INTRAVENOUS

## 2018-04-13 MED ORDER — KETAMINE HCL 50 MG/5ML IJ SOSY
PREFILLED_SYRINGE | INTRAMUSCULAR | Status: AC
  Filled 2018-04-13: qty 5

## 2018-04-13 MED ORDER — BUPIVACAINE IN DEXTROSE 0.75-8.25 % IT SOLN
INTRATHECAL | Status: DC | PRN
  Administered 2018-04-13: 1.7 mL via INTRATHECAL

## 2018-04-13 MED ORDER — DIPHENHYDRAMINE HCL 50 MG/ML IJ SOLN
INTRAMUSCULAR | Status: DC | PRN
  Administered 2018-04-13: 12.5 mg via INTRAVENOUS

## 2018-04-13 MED ORDER — VANCOMYCIN HCL IN DEXTROSE 1-5 GM/200ML-% IV SOLN
1000.0000 mg | INTRAVENOUS | Status: AC
  Administered 2018-04-13: 1000 mg via INTRAVENOUS
  Filled 2018-04-13: qty 200

## 2018-04-13 MED ORDER — TOBRAMYCIN SULFATE 1.2 G IJ SOLR
INTRAMUSCULAR | Status: DC | PRN
  Administered 2018-04-13: 1.2 g

## 2018-04-13 MED ORDER — ONDANSETRON HCL 4 MG/2ML IJ SOLN
INTRAMUSCULAR | Status: AC
  Filled 2018-04-13: qty 2

## 2018-04-13 MED ORDER — TOBRAMYCIN SULFATE 1.2 G IJ SOLR
INTRAMUSCULAR | Status: AC
  Filled 2018-04-13: qty 1.2

## 2018-04-13 MED ORDER — 0.9 % SODIUM CHLORIDE (POUR BTL) OPTIME
TOPICAL | Status: DC | PRN
  Administered 2018-04-13: 1000 mL

## 2018-04-13 MED ORDER — SODIUM CHLORIDE 0.9 % IR SOLN
Status: DC | PRN
  Administered 2018-04-13: 3000 mL

## 2018-04-13 MED ORDER — ONDANSETRON HCL 4 MG/2ML IJ SOLN
INTRAMUSCULAR | Status: DC | PRN
  Administered 2018-04-13: 4 mg via INTRAVENOUS

## 2018-04-13 MED ORDER — KETAMINE HCL 10 MG/ML IJ SOLN
INTRAMUSCULAR | Status: DC | PRN
  Administered 2018-04-13: 50 mg via INTRAVENOUS

## 2018-04-13 MED ORDER — FENTANYL CITRATE (PF) 100 MCG/2ML IJ SOLN: 25.0000 ug | INTRAMUSCULAR | Status: DC | PRN

## 2018-04-13 MED ORDER — LACTATED RINGERS IV SOLN
INTRAVENOUS | Status: DC
  Administered 2018-04-13: 13:00:00 via INTRAVENOUS

## 2018-04-13 MED ORDER — PHENYLEPHRINE 40 MCG/ML (10ML) SYRINGE FOR IV PUSH (FOR BLOOD PRESSURE SUPPORT)
PREFILLED_SYRINGE | INTRAVENOUS | Status: AC
  Filled 2018-04-13: qty 10

## 2018-04-13 MED ORDER — VANCOMYCIN HCL 1000 MG IV SOLR
INTRAVENOUS | Status: AC
  Filled 2018-04-13: qty 1000

## 2018-04-13 MED ORDER — PROPOFOL 10 MG/ML IV BOLUS
INTRAVENOUS | Status: AC
  Filled 2018-04-13: qty 20

## 2018-04-13 MED ORDER — POVIDONE-IODINE 10 % EX SWAB: 2.0000 "application " | Freq: Once | CUTANEOUS | Status: DC

## 2018-04-13 MED ORDER — DEXAMETHASONE SODIUM PHOSPHATE 10 MG/ML IJ SOLN
INTRAMUSCULAR | Status: DC | PRN
  Administered 2018-04-13: 10 mg via INTRAVENOUS

## 2018-04-13 SURGICAL SUPPLY — 57 items
ADH SKN CLS APL DERMABOND .7 (GAUZE/BANDAGES/DRESSINGS) ×1
BLADE SAW SAG 73X25 THK (BLADE) ×1
BLADE SAW SGTL 73X25 THK (BLADE) ×2 IMPLANT
BNDG COHESIVE 6X5 TAN STRL LF (GAUZE/BANDAGES/DRESSINGS) ×3 IMPLANT
BRUSH FEMORAL CANAL (MISCELLANEOUS) ×5 IMPLANT
BRUSH SCRUB SURG 4.25 DISP (MISCELLANEOUS) ×6 IMPLANT
CEMENT BONE SIMPLEX SPEEDSET (Cement) ×4 IMPLANT
CEMENT RESTRICTOR BONE PREP ST (KITS) ×1 IMPLANT
CHLORAPREP W/TINT 26ML (MISCELLANEOUS) ×3 IMPLANT
COVER SURGICAL LIGHT HANDLE (MISCELLANEOUS) ×3 IMPLANT
COVER WAND RF STERILE (DRAPES) ×3 IMPLANT
DERMABOND ADVANCED (GAUZE/BANDAGES/DRESSINGS) ×2
DERMABOND ADVANCED .7 DNX12 (GAUZE/BANDAGES/DRESSINGS) ×1 IMPLANT
DRAPE HIP W/POCKET STRL (DRAPE) ×3 IMPLANT
DRAPE INCISE IOBAN 85X60 (DRAPES) ×6 IMPLANT
DRAPE ORTHO SPLIT 77X108 STRL (DRAPES) ×6
DRAPE SURG 17X23 STRL (DRAPES) ×3 IMPLANT
DRAPE SURG ORHT 6 SPLT 77X108 (DRAPES) ×2 IMPLANT
DRAPE U-SHAPE 47X51 STRL (DRAPES) ×3 IMPLANT
DRSG MEPILEX BORDER 4X8 (GAUZE/BANDAGES/DRESSINGS) ×3 IMPLANT
ELECT BLADE 6.5 EXT (BLADE) IMPLANT
ELECT CAUTERY BLADE 6.4 (BLADE) IMPLANT
ELECT REM PT RETURN 9FT ADLT (ELECTROSURGICAL) ×3
ELECTRODE REM PT RTRN 9FT ADLT (ELECTROSURGICAL) ×1 IMPLANT
GLOVE BIO SURGEON STRL SZ7.5 (GLOVE) ×9 IMPLANT
GLOVE BIOGEL PI IND STRL 7.5 (GLOVE) ×1 IMPLANT
GLOVE BIOGEL PI INDICATOR 7.5 (GLOVE) ×2
GOWN STRL REUS W/ TWL LRG LVL3 (GOWN DISPOSABLE) ×2 IMPLANT
GOWN STRL REUS W/TWL LRG LVL3 (GOWN DISPOSABLE) ×6
HANDPIECE INTERPULSE COAX TIP (DISPOSABLE) ×3
HEAD MODULAR ENDO (Orthopedic Implant) ×3 IMPLANT
HEAD UNPLR 46XMDLR STRL HIP (Orthopedic Implant) IMPLANT
KIT BASIN OR (CUSTOM PROCEDURE TRAY) ×3 IMPLANT
KIT BONE PREP STRYKER (KITS) ×1
KIT TURNOVER KIT B (KITS) ×3 IMPLANT
MANIFOLD NEPTUNE II (INSTRUMENTS) ×3 IMPLANT
NDL 1/2 CIR MAYO (NEEDLE) ×1 IMPLANT
NEEDLE 1/2 CIR MAYO (NEEDLE) ×3 IMPLANT
NS IRRIG 1000ML POUR BTL (IV SOLUTION) ×3 IMPLANT
PACK TOTAL JOINT (CUSTOM PROCEDURE TRAY) ×3 IMPLANT
PAD ARMBOARD 7.5X6 YLW CONV (MISCELLANEOUS) ×6 IMPLANT
PRESSURIZER FEMORAL UNIV (MISCELLANEOUS) ×3 IMPLANT
RETRIEVER SUT HEWSON (MISCELLANEOUS) ×2 IMPLANT
SET HNDPC FAN SPRY TIP SCT (DISPOSABLE) ×1 IMPLANT
SLEEVE UNITRAX V40 STD (Orthopedic Implant) ×2 IMPLANT
STAPLER VISISTAT 35W (STAPLE) ×3 IMPLANT
STEM FEM CEMT 49X158 SZ6 127D (Stem) ×2 IMPLANT
STOCKINETTE IMPERVIOUS LG (DRAPES) ×3 IMPLANT
SUT MNCRL AB 3-0 PS2 18 (SUTURE) ×3 IMPLANT
SUT VIC AB 0 CT1 27 (SUTURE) ×6
SUT VIC AB 0 CT1 27XBRD ANBCTR (SUTURE) ×2 IMPLANT
SUT VIC AB 1 CTX 18 (SUTURE) ×3 IMPLANT
SUT VIC AB 2-0 CT1 27 (SUTURE) ×6
SUT VIC AB 2-0 CT1 TAPERPNT 27 (SUTURE) ×2 IMPLANT
TOWEL OR 17X26 10 PK STRL BLUE (TOWEL DISPOSABLE) ×3 IMPLANT
TOWER CARTRIDGE SMART MIX (DISPOSABLE) ×3 IMPLANT
WATER STERILE IRR 1000ML POUR (IV SOLUTION) ×3 IMPLANT

## 2018-04-13 NOTE — Progress Notes (Signed)
CSW acknowledges consult to follow patient for possible SNF needs after hip surgery today.   CSW will continue to follow for discharge planning needs.   [place], [place]

## 2018-04-13 NOTE — Transfer of Care (Signed)
Immediate Anesthesia Transfer of Care Note  Patient: Morgan Weaver  Procedure(s) Performed: ARTHROPLASTY BIPOLAR HIP (HEMIARTHROPLASTY) (Right Hip)  Patient Location: PACU  Anesthesia Type:Spinal  Level of Consciousness: awake, alert  and confused  Airway & Oxygen Therapy: Patient Spontanous Breathing and Patient connected to face mask oxygen  Post-op Assessment: Report given to RN and Post -op Vital signs reviewed and stable  Post vital signs: Reviewed and stable  Last Vitals:  Vitals Value Taken Time  BP    Temp    Pulse 101 04/13/2018  4:52 PM  Resp 23 04/13/2018  4:52 PM  SpO2 76 % 04/13/2018  4:52 PM  Vitals shown include unvalidated device data.  Last Pain:  Vitals:   04/13/18 0900  TempSrc:   PainSc: 3       Patients Stated Pain Goal: 1 (47/84/12 8208)  Complications: No apparent anesthesia complications

## 2018-04-13 NOTE — Anesthesia Preprocedure Evaluation (Signed)
Anesthesia Evaluation  Patient identified by MRN, date of birth, ID band Patient awake    Reviewed: Allergy & Precautions, NPO status , Patient's Chart, lab work & pertinent test results  Airway Mallampati: II  TM Distance: >3 FB Neck ROM: Full    Dental no notable dental hx.    Pulmonary former smoker,    Pulmonary exam normal breath sounds clear to auscultation       Cardiovascular hypertension, Pt. on medications Normal cardiovascular exam+ dysrhythmias Atrial Fibrillation  Rhythm:Regular Rate:Normal     Neuro/Psych Dementia negative neurological ROS     GI/Hepatic negative GI ROS, Neg liver ROS,   Endo/Other  negative endocrine ROS  Renal/GU negative Renal ROS  negative genitourinary   Musculoskeletal negative musculoskeletal ROS (+)   Abdominal   Peds negative pediatric ROS (+)  Hematology negative hematology ROS (+)   Anesthesia Other Findings   Reproductive/Obstetrics negative OB ROS                             Anesthesia Physical Anesthesia Plan  ASA: II  Anesthesia Plan: Spinal   Post-op Pain Management:    Induction:   PONV Risk Score and Plan: 2 and Ondansetron and Treatment may vary due to age or medical condition  Airway Management Planned: Simple Face Mask  Additional Equipment:   Intra-op Plan:   Post-operative Plan:   Informed Consent: I have reviewed the patients History and Physical, chart, labs and discussed the procedure including the risks, benefits and alternatives for the proposed anesthesia with the patient or authorized representative who has indicated his/her understanding and acceptance.   Dental advisory given  Plan Discussed with: CRNA  Anesthesia Plan Comments:         Anesthesia Quick Evaluation

## 2018-04-13 NOTE — Plan of Care (Signed)

## 2018-04-13 NOTE — Anesthesia Procedure Notes (Signed)
Procedure Name: MAC Date/Time: 04/13/2018 2:20 PM Performed by: Trinna Post., CRNA Pre-anesthesia Checklist: Patient identified, Emergency Drugs available, Suction available and Patient being monitored Oxygen Delivery Method: Simple face mask

## 2018-04-13 NOTE — Consult Note (Signed)
Orthopaedic Trauma Service (OTS) Consult   Patient ID: Morgan Weaver MRN: [id number] DOB/AGE: 1929-05-30 84 y.o.  Reason for Consult:Right femoral neck fracture Referring Physician: Dr. Ophelia Charter, MD Raliegh Ip  HPI: Morgan Weaver is an 84 y.o. female who is being seen in consultation at the request of Dr. Griffin Basil for evaluation of right femoral neck fracture.  Patient was at home.  I where she lives with her son and was answering the phone where she fell and was unable to get up.  She had significant right hip pain.  She was brought to the emergency room where x-rays showed a displaced right femoral neck fracture.  Orthopedics was consulted.  Due to OR availability and complexity of fracture was asked to take over care.  She otherwise is doing well.  She has a history of dementia.  Denies any other injuries.  Son is at bedside during the examination.  Past Medical History:  Diagnosis Date  . Arthritis   . Hemorrhoid   . Hyperlipidemia   . Hypertension   . Lyme disease   . Ovarian cyst   . Syncope    3x  . Thyroid disease    hypothyroidism  . Torn rotator cuff   . Weight decrease     Past Surgical History:  Procedure Laterality Date  . ABDOMINAL HYSTERECTOMY  1970  . APPENDECTOMY  1952  . HIP ARTHROPLASTY Left 03/16/2013   bipolar        Dr Wendee Beavers  . HIP ARTHROPLASTY Left 03/17/2013   Procedure: ARTHROPLASTY BIPOLAR HIP;  Surgeon: Augustin Schooling, MD;  Location: [facility];  Service: Orthopedics;  Laterality: Left;  . KNEE ARTHROSCOPY    . OOPHORECTOMY  1955   removed due to the ovary having gangrene  . OTHER SURGICAL HISTORY     left foot surgery  . OTHER SURGICAL HISTORY     right foot surgery  . [place]   left  . TONSILLECTOMY      Family History  Problem Relation Age of Onset  . Heart disease Father        heart attack  . Hypertension Father   . Other Son        brain tumor     Social History:  reports that she has quit smoking. She has never  used smokeless tobacco. She reports that she drank about 14.0 standard drinks of alcohol per week. She reports that she does not use drugs.  Allergies:  Allergies  Allergen Reactions  . Exelon [Rivastigmine Tartrate] Other (See Comments)    Asystole/sinus pauses/syncope  . Aricept [Donepezil Hcl] Nausea And Vomiting and Other (See Comments)    Irregular heartbeat also  . Mobic [Meloxicam] Other (See Comments)    Abdominal pain, GI ulcer (?)   . Namenda [Memantine Hcl] Nausea And Vomiting and Other (See Comments)    Irregular heartbeat also  . Tetracyclines & Related Swelling    Lips swell   . Tape Other (See Comments)    TAPE BRUISES THE SKIN (IT IS VERY THIN)  . Ancef [Cefazolin] Rash    Diffuse rash with urticaria, no respiratory involvement  . Sulfa Drugs Cross Reactors Other (See Comments)    Reaction not recalled by the patient    Medications:  No current facility-administered medications on file prior to encounter.    Current Outpatient Medications on File Prior to Encounter  Medication Sig Dispense Refill  . Cyanocobalamin (VITAMIN B-12 PO) Take 1 tablet  by mouth daily.    Marland Kitchen GINKGO BILOBA PO Take 1 capsule by mouth daily as needed (memory).    Marland Kitchen levothyroxine (SYNTHROID, LEVOTHROID) 137 MCG tablet Take 137 mcg by mouth daily before breakfast.   0  . OVER THE COUNTER MEDICATION Take 1 tablet by mouth daily. Vitamins D3    . sertraline (ZOLOFT) 25 MG tablet Take 1 tablet (25 mg total) by mouth daily. 90 tablet 11    ROS: Constitutional: No fever or chills Vision: No changes in vision ENT: No difficulty swallowing CV: No chest pain Pulm: No SOB or wheezing GI: No nausea or vomiting GU: No urgency or inability to hold urine Skin: No poor wound healing Neurologic: No numbness or tingling Psychiatric: No depression or anxiety Heme: No bruising Allergic: No reaction to medications or food   Exam: Blood pressure (!) 146/64, pulse 61, temperature 99.4 F (37.4 C),  temperature source Oral, resp. rate 16, SpO2 98 %. General: No acute distress Orientation: Awake and alert Mood and Affect: Cooperative and pleasant Gait: Unable to assess due to her pain. Coordination and balance: Within normal limits.  Right lower extremity: Reveals skin without lesions.  No significant swelling or obvious deformity.  Her leg is shortened and externally rotated.  No deformity about the knee or the ankle.  Patient has intact motor and sensory function distally.  She is warm well-perfused foot.  Reflexes are within normal limits.  No lymphadenopathy.  Left lower extremity: Skin without lesions. No tenderness to palpation. Full painless ROM, full strength in each muscle groups without evidence of instability.   Medical Decision Making: Imaging: X-rays of the pelvis and right hip show a displaced right femoral neck fracture.  Labs:  Results for orders placed or performed during the hospital encounter of 04/12/18 (from the past 48 hour(s))  Comprehensive metabolic panel     Status: Abnormal   Collection Time: 04/12/18  5:44 PM  Result Value Ref Range   Sodium 135 135 - 145 mmol/L   Potassium 3.7 3.5 - 5.1 mmol/L   Chloride 102 98 - 111 mmol/L   CO2 24 22 - 32 mmol/L   Glucose, Bld 125 (H) 70 - 99 mg/dL   BUN 23 8 - 23 mg/dL   Creatinine, Ser 1.14 (H) 0.44 - 1.00 mg/dL   Calcium 9.2 8.9 - 10.3 mg/dL   Total Protein 6.9 6.5 - 8.1 g/dL   Albumin 4.1 3.5 - 5.0 g/dL   AST 18 15 - 41 U/L   ALT 11 0 - 44 U/L   Alkaline Phosphatase 55 38 - 126 U/L   Total Bilirubin 0.8 0.3 - 1.2 mg/dL   GFR calc non Af Amer 42 (L) >60 mL/min   GFR calc Af Amer 49 (L) >60 mL/min    Comment: (NOTE) The eGFR has been calculated using the CKD EPI equation. This calculation has not been validated in all clinical situations. eGFR's persistently <60 mL/min signify possible Chronic Kidney Disease.    Anion gap 9 5 - 15    Comment: Performed at [facility], [place] [address] [place], [place] [address]  CBC with Differential     Status: Abnormal   Collection Time: 04/12/18  5:44 PM  Result Value Ref Range   WBC 11.1 (H) 4.0 - 10.5 K/uL   RBC 4.01 3.87 - 5.11 MIL/uL   Hemoglobin 12.4 12.0 - 15.0 g/dL   HCT 37.1 36.0 - 46.0 %   MCV 92.5 80.0 - 100.0 fL  MCH 30.9 26.0 - 34.0 pg   MCHC 33.4 30.0 - 36.0 g/dL   RDW 12.6 11.5 - 15.5 %   Platelets 189 150 - 400 K/uL   nRBC 0.0 0.0 - 0.2 %   Neutrophils Relative % 79 %   Neutro Abs 8.8 (H) 1.7 - 7.7 K/uL   Lymphocytes Relative 13 %   Lymphs Abs 1.5 0.7 - 4.0 K/uL   Monocytes Relative 6 %   Monocytes Absolute 0.7 0.1 - 1.0 K/uL   Eosinophils Relative 1 %   Eosinophils Absolute 0.1 0.0 - 0.5 K/uL   Basophils Relative 0 %   Basophils Absolute 0.0 0.0 - 0.1 K/uL   Immature Granulocytes 1 %   Abs Immature Granulocytes 0.05 0.00 - 0.07 K/uL    Comment: Performed at [facility], [place] [address], [place], [place] [address]  Troponin I     Status: None   Collection Time: 04/12/18  8:13 PM  Result Value Ref Range   Troponin I <0.03 <0.03 ng/mL    Comment: Performed at [facility], [place] [address], [place], [place] [address]  MRSA PCR Screening     Status: None   Collection Time: 04/13/18 12:48 AM  Result Value Ref Range   MRSA by PCR NEGATIVE NEGATIVE    Comment:        The GeneXpert MRSA Assay (FDA approved for NASAL specimens only), is one component of a comprehensive MRSA colonization surveillance program. It is not intended to diagnose MRSA infection nor to guide or monitor treatment for MRSA infections. Performed at [facility], [place] [address] [place],  [address]     Medical history and chart was reviewed  Assessment/Plan: 84 year old female with a history of dementia with hyper tension and A. fib who presents with a right femoral neck fracture.  Due to the displacement of the fracture I feel that right hip hemiarthroplasty is warranted.  I  discussed the risks and benefits with the patient and her son.  Risks included but not limited to bleeding, infection, dislocation, fracture around the prosthesis, leg length discrepancy, nerve and blood vessel injury, the possibility of stroke, heart attack or even death.  In light of these risks the patient wishes to proceed with surgery and consent was obtained.   Shona Needles, MD [facility] [phone] (phone)

## 2018-04-13 NOTE — Anesthesia Postprocedure Evaluation (Signed)
Anesthesia Post Note  Patient: Morgan Weaver  Procedure(s) Performed: ARTHROPLASTY BIPOLAR HIP (HEMIARTHROPLASTY) (Right Hip)     Patient location during evaluation: PACU Anesthesia Type: Spinal Level of consciousness: awake and alert Pain management: pain level controlled Vital Signs Assessment: post-procedure vital signs reviewed and stable Respiratory status: spontaneous breathing, nonlabored ventilation, respiratory function stable and patient connected to nasal cannula oxygen Cardiovascular status: blood pressure returned to baseline and stable Postop Assessment: no apparent nausea or vomiting Anesthetic complications: no    Last Vitals:  Vitals:   04/13/18 1724 04/13/18 1739  BP: 104/63 (!) 110/58  Pulse: 64   Resp: 14 16  Temp:    SpO2: 100%     Last Pain:  Vitals:   04/13/18 1725  TempSrc:   PainSc: 0-No pain                 Shenique Childers DAVID

## 2018-04-13 NOTE — Progress Notes (Signed)
  PROGRESS NOTE  ALLYANA VOGAN SCB:[id number] DOB: June 02, 1929 DOA: 04/12/2018 PCP: Leonard Downing, MD  Brief Narrative: 89yow sustained fall, presented with pain, admitted for right femoral neck fracture.  Assessment/Plan Right femoral neck fracture secondary to fall, likely mechanical --s/p surgery 10/30 --management per orthopedics  CKD stage III --stable  Hypothyroidism  --continue levothyroxine   PAF. CHA2DS2-VASc 5. Not on anticoagulation, poor candidate per Dr. Jackalyn Lombard note 07/07/2017  Alzheimer's dementia --appears stable  DVT prophylaxis: per orthopedics Code Status: Full Family Communication: none Disposition Plan: pending    Murray Hodgkins, MD  [facility] Direct contact: [phone] --[place]ia [place]  --[email or web address]; password [email or web address]  7PM-7AM contact night coverage as above 04/13/2018, 6:23 PM  LOS: 1 day   Consultants:  Orthopedics   Procedures:    Antimicrobials:    Interval history/Subjective: Resting post-op in PACU  Objective: Vitals:  Vitals:   04/13/18 1724 04/13/18 1739  BP: 104/63 (!) 110/58  Pulse: 64   Resp: 14 16  Temp:    SpO2: 100%     Exam:  Constitutional:  . Appears calm and comfortable, awakens to voice Respiratory:  . CTA bilaterally, no w/r/r.  . Respiratory effort normal.  Cardiovascular:  . RRR, no m/r/g . No LE extremity edema   Psychiatric:  . Mental status Cannot assess mood or affect, awakens to voice  I have personally reviewed the following:   Data: . admission labs reviewed . CXR reviewed NAD and EKG reviewed SR no acute changes  Scheduled Meds: . [MAR Hold] levothyroxine  137 mcg Oral Q0600  . povidone-iodine  2 application Topical Once  . [MAR Hold] sertraline  25 mg Oral Daily   Continuous Infusions: . lactated ringers 10 mL/hr at 04/13/18 1308  . tranexamic acid      Principal Problem:   Femoral neck fracture (HCC) Active Problems:   Hypothyroid   CKD (chronic  kidney disease) stage 3, GFR 30-59 ml/min (HCC)   Atrial fibrillation (HCC)   Pressure injury of skin   LOS: 1 day

## 2018-04-13 NOTE — Op Note (Signed)
OrthopaedicSurgeryOperativeNote [phone]) Date of Surgery: 04/13/2018  Admit Date: 04/12/2018   Diagnoses: Pre-Op Diagnoses: Right displaced femoral neck fracture  Post-Op Diagnosis: Same  Procedures: CPT 27125-Right hip hemiarthroplasty  Surgeons: Primary: Shona Needles, MD   Location:[facility] OR ROOM 07   AnesthesiaGeneral   Antibiotics:Vancomycin 1 gm (had reaction 3/4th of way through)   Tourniquettime:None  QIONGEXBMWUXLKGMWN:027 mL   Complications:None  Specimens:None  Implants: Implant Name Type Inv. Item Serial No. Manufacturer Lot No. LRB No. Used Action  CEMENT BONE SIMPLEX SPEEDSET - [id number] Cement CEMENT BONE SIMPLEX SPEEDSET  [employer] [id number] Right 2 Implanted  STEM FEM CEMT [id number] SZ6 127D - [id number] Stem STEM FEM CEMT [id number] SZ6 127D  [employer] [id number] Right 1 Implanted  HEAD MODULAR ENDO - [id number] Orthopedic Implant HEAD MODULAR ENDO  [employer] [id number] Right 1 Implanted  SLEEVE UNITRAX V40 STD - [id number] Orthopedic Implant SLEEVE UNITRAX V40 STD  [employer] [id number] Right 1 Implanted    IndicationsforSurgery: 84 year old female with a history of dementia with hypertension and A. fib who presents with a right femoral neck fracture. Due to the displacement of the fracture I feel that right hip hemiarthroplasty is warranted.  I discussed the risks and benefits with the patient and her son.  Risks included but not limited to bleeding, infection, dislocation, fracture around the prosthesis, leg length discrepancy, nerve and blood vessel injury, the possibility of stroke, heart attack or even death.  Consent was obtained.  Operative Findings: Right hip hemiarthroplasty with [employer] Accolade cemented hemi as noted above  Procedure: The patient was identified in the preoperative holding area. Consent was confirmed with the patient and their family and all questions were answered. The operative extremity  was marked after confirmation with the patient. They were then brought back to the operating room by our anesthesia colleagues. She was carefully transferred over to a regular OR table and a spinal was placed. The patient was placed in the lateral decubitus position with the operative side up. All bony prominences were well padded. The bean bag was inflated. The operative extremity was prepped and draped in usual sterile fashion. A timeout was performed to verify the patient, the procedure and the extremity. Preoperative antibiotics were dosed.  A posterolateral approach was made. The skin incised and the subcutaneous tissue was incised along the incision. The IT band was split inline with the incision, as well. I identified the piriformis tendon and reflected this off of the greater trochanter and tagged this for later repair. The short external rotators were reflected off the greater trochanter and tagged for later repair. The posterior hip capsule was then visualized and split in line with the femoral head. The inferior and superior portion of the capsule was reflected off as flaps. These were tagged for lateral repair. The capsule was incised all the way to the acetabular labrum to exposed the femoral head and femoral neck fracture.  An oscillating saw was used to make a femoral neck cut distal to the fracture. The femoral head was removed with a corkscrew and it was measured for sizing of the femoral prothesis. The acetabulum was cleared of the ligamentum teres. The femoral head sizer was used and a 46 mm head was chosen.  I then proceeded to focus on broaching the femur. A box osteotomy was used to remove the lateral portion of the femoral neck. A canal finder was used to enter the canal. A lateralizer was used to ream a portion of the lateral femoral neck  to prevent varus alignment of the prothesis. Sequential broaching was performed to size the canal and prothesis. I chose a size 6 stem as it had good  fit with the broach. A calcar planer was used to level the femoral neck cut. The broach was removed and the canal was prepped for cementing.  A canal restrictor was placed in the femoral shaft. The canal was irrigated and cleaned. A lap was placed in the acetabulum to prevent cement from entering the joint. Cement was mixed and once it was ready it was slowly put into the canal. The prosthesis was placed in her natural version. It was held in position while the cement hardened. Once it was hard the femoral stem was trialed off of and I chose a neutral neck. The trial showed that her leg lengths were stable and her hip was stable with inability to dislocated in normal hip positions. The final prosthesis and head was was positioned and the hip was relocated.  The incision was irrigated with normal saline. 1.2 grams of tobramycin powder was placed in the wound. The capsule was closed with #1 vicryl in interrupted fashion. The piriformis tendon and short external rotators were brought through drill holes in the greater trochanter and were tied down. The IT band was closed with 0 vicryl suture. The skin was closed with 2-0 vicryl, 3-0 monocryl and dermabond. A mepilex dressing was placed. The patient was placed supine and taken to the PACU in stable condition.   Post Op Plan/Instructions: Patient will be weightbearing as tolerated. She will have posterior hip precautions. She will be started on DVT prophylaxis postoperative day 1. I will recommend lovenox. No postoperative antibiotics due to her multiple reactions and allergies.  I was present and performed the entire surgery.  Katha Hamming, MD [facility]

## 2018-04-13 NOTE — Anesthesia Procedure Notes (Signed)
Spinal  Patient location during procedure: OR Staffing Anesthesiologist: Montez Hageman, MD Performed: anesthesiologist  Preanesthetic Checklist Completed: patient identified, site marked, surgical consent, pre-op evaluation, timeout performed, IV checked, risks and benefits discussed and monitors and equipment checked Spinal Block Patient position: right lateral decubitus Prep: Betadine Patient monitoring: heart rate, continuous pulse ox and blood pressure Approach: left paramedian Location: L3-4 Injection technique: single-shot Needle Needle type: Spinocan  Needle gauge: 22 G Needle length: 9 cm Additional Notes Expiration date of kit checked and confirmed. Patient tolerated procedure well, without complications.

## 2018-04-14 ENCOUNTER — Encounter (HOSPITAL_COMMUNITY): Payer: Self-pay | Admitting: Student

## 2018-04-14 DIAGNOSIS — N179 Acute kidney failure, unspecified: Secondary | ICD-10-CM

## 2018-04-14 DIAGNOSIS — D62 Acute posthemorrhagic anemia: Secondary | ICD-10-CM

## 2018-04-14 LAB — BASIC METABOLIC PANEL
ANION GAP: 6 (ref 5–15)
Anion gap: 8 (ref 5–15)
BUN: 25 mg/dL — AB (ref 8–23)
BUN: 35 mg/dL — ABNORMAL HIGH (ref 8–23)
CALCIUM: 8 mg/dL — AB (ref 8.9–10.3)
CHLORIDE: 103 mmol/L (ref 98–111)
CHLORIDE: 107 mmol/L (ref 98–111)
CO2: 22 mmol/L (ref 22–32)
CO2: 22 mmol/L (ref 22–32)
CREATININE: 1.49 mg/dL — AB (ref 0.44–1.00)
CREATININE: 2.1 mg/dL — AB (ref 0.44–1.00)
Calcium: 8.5 mg/dL — ABNORMAL LOW (ref 8.9–10.3)
GFR calc Af Amer: 23 mL/min — ABNORMAL LOW (ref >60)
GFR calc non Af Amer: 30 mL/min — ABNORMAL LOW (ref >60)
GFR calc non Af Amer: 20 mL/min — ABNORMAL LOW (ref >60)
GFR, EST AFRICAN AMERICAN: 35 mL/min — AB (ref >60)
GLUCOSE: 107 mg/dL — AB (ref 70–99)
Glucose, Bld: 109 mg/dL — ABNORMAL HIGH (ref 70–99)
Potassium: 4 mmol/L (ref 3.5–5.1)
Potassium: 3.8 mmol/L (ref 3.5–5.1)
SODIUM: 133 mmol/L — AB (ref 135–145)
Sodium: 135 mmol/L (ref 135–145)

## 2018-04-14 LAB — CBC
HEMATOCRIT: 28.9 % — AB (ref 36.0–46.0)
HEMATOCRIT: 25.8 % — AB (ref 36.0–46.0)
HEMOGLOBIN: 9.2 g/dL — AB (ref 12.0–15.0)
Hemoglobin: 8.3 g/dL — ABNORMAL LOW (ref 12.0–15.0)
MCH: 29.4 pg (ref 26.0–34.0)
MCH: 30.2 pg (ref 26.0–34.0)
MCHC: 31.8 g/dL (ref 30.0–36.0)
MCHC: 32.2 g/dL (ref 30.0–36.0)
MCV: 92.3 fL (ref 80.0–100.0)
MCV: 93.8 fL (ref 80.0–100.0)
PLATELETS: 115 10*3/uL — AB (ref 150–400)
Platelets: 159 10*3/uL (ref 150–400)
RBC: 3.13 MIL/uL — ABNORMAL LOW (ref 3.87–5.11)
RBC: 2.75 MIL/uL — AB (ref 3.87–5.11)
RDW: 12.7 % (ref 11.5–15.5)
RDW: 12.7 % (ref 11.5–15.5)
WBC: 10 10*3/uL (ref 4.0–10.5)
WBC: 8 10*3/uL (ref 4.0–10.5)
nRBC: 0 % (ref 0.0–0.2)
nRBC: 0 % (ref 0.0–0.2)

## 2018-04-14 MED ORDER — SODIUM CHLORIDE 0.9 % IV SOLN
INTRAVENOUS | Status: DC
  Administered 2018-04-14 – 2018-04-17 (×7): via INTRAVENOUS

## 2018-04-14 MED ORDER — ENOXAPARIN SODIUM 40 MG/0.4ML ~~LOC~~ SOLN: 40.0000 mg | Freq: Every day | SUBCUTANEOUS | Status: DC

## 2018-04-14 MED ORDER — DIPHENHYDRAMINE HCL 25 MG PO CAPS
25.0000 mg | ORAL_CAPSULE | Freq: Once | ORAL | Status: AC
  Administered 2018-04-14: 25 mg via ORAL
  Filled 2018-04-14: qty 1

## 2018-04-14 MED ORDER — SODIUM CHLORIDE 0.9 % IV BOLUS
500.0000 mL | Freq: Once | INTRAVENOUS | Status: AC
  Administered 2018-04-14: 500 mL via INTRAVENOUS

## 2018-04-14 MED ORDER — ENOXAPARIN SODIUM 30 MG/0.3ML ~~LOC~~ SOLN
30.0000 mg | Freq: Every day | SUBCUTANEOUS | Status: DC
  Administered 2018-04-14: 30 mg via SUBCUTANEOUS
  Filled 2018-04-14: qty 0.3

## 2018-04-14 MED ORDER — ACETAMINOPHEN 325 MG PO TABS
650.0000 mg | ORAL_TABLET | Freq: Four times a day (QID) | ORAL | Status: DC | PRN
  Administered 2018-04-15 – 2018-04-19 (×4): 650 mg via ORAL
  Filled 2018-04-14 (×4): qty 2

## 2018-04-14 NOTE — Progress Notes (Signed)
Orthopaedic Trauma Progress Note  S: Patient doing well slightly confused.  She had to have an INO catheter overnight due to urinary retention.  O:  Vitals:   04/14/18 0018 04/14/18 0421  BP: (!) 104/55 (!) 90/45  Pulse: 61 70  Resp: 17 17  Temp: 97.6 F (36.4 C) 97.9 F (36.6 C)  SpO2: 95% 97%    General: No acute distress awake and alert Right lower extremity: Dressing is in place is clean dry and intact.  The swelling is appropriate.  Motor and sensory function intact distally.  Hip abduction pillow in place.  Imaging: Stable postoperative imaging  Labs:  Results for orders placed or performed during the hospital encounter of 04/12/18 (from the past 24 hour(s))  Basic metabolic panel     Status: Abnormal   Collection Time: 04/14/18  8:10 AM  Result Value Ref Range   Sodium 133 (L) 135 - 145 mmol/L   Potassium 4.0 3.5 - 5.1 mmol/L   Chloride 103 98 - 111 mmol/L   CO2 22 22 - 32 mmol/L   Glucose, Bld 109 (H) 70 - 99 mg/dL   BUN 25 (H) 8 - 23 mg/dL   Creatinine, Ser 1.49 (H) 0.44 - 1.00 mg/dL   Calcium 8.5 (L) 8.9 - 10.3 mg/dL   GFR calc non Af Amer 30 (L) >60 mL/min   GFR calc Af Amer 35 (L) >60 mL/min   Anion gap 8 5 - 15  CBC     Status: Abnormal   Collection Time: 04/14/18  8:10 AM  Result Value Ref Range   WBC 10.0 4.0 - 10.5 K/uL   RBC 3.13 (L) 3.87 - 5.11 MIL/uL   Hemoglobin 9.2 (L) 12.0 - 15.0 g/dL   HCT 28.9 (L) 36.0 - 46.0 %   MCV 92.3 80.0 - 100.0 fL   MCH 29.4 26.0 - 34.0 pg   MCHC 31.8 30.0 - 36.0 g/dL   RDW 12.7 11.5 - 15.5 %   Platelets 159 150 - 400 K/uL   nRBC 0.0 0.0 - 0.2 %    Assessment: 84 year old female s/p fall  Injuries: Right femoral neck fracture s/p right hip hemiarthroplasty  Weightbearing: WBAT, posterior hip precautions  Insicional and dressing care: Keep dry until postoperative day 2  Orthopedic device(s):Hip abduction pillow while in bed  CV/Blood loss:Hgb stable at 9.2, hemodynamically stable  Pain management: Norco 5/325  mg q 6hours PRN  VTE prophylaxis: Lovenox 40 mg daily to start today  ID: No postoperative antibiotics  Foley/Lines: I/O cath for urinary retention, continue to monitor  Medical co-morbidities: Dementia HTN Hypothyroidism-home meds  Dispo: PT/OT eval, likely SNF placement  Follow - up plan: Follow up 2-3 weeks for incision check and x-rays   Shona Needles, MD [facility] [phone] (phone)

## 2018-04-14 NOTE — Progress Notes (Signed)
Patient was unable to void on her own.  Staff tried warm compressions and got patient up to the bed side commode, ran water, and massaged lower abdomen with no success.  Staff did bladder scan.  Showed 505 mL. Staff did I/O catheter at 0535 and removed 700 mL of urine.

## 2018-04-14 NOTE — Progress Notes (Signed)
  PROGRESS NOTE  Morgan Weaver YTR:[id number] DOB: 1928-11-09 DOA: 04/12/2018 PCP: Leonard Downing, MD  Brief Narrative: 89yow sustained fall, presented with pain, admitted for right femoral neck fracture.  Assessment/Plan Right femoral neck fracture secondary to fall, likely mechanical. S/p surgery 10/30 --Continue management per orthopedics.  Acute blood loss anemia secondary to surgery. --Asymptomatic.  Follow-up CBC in a.m.  AKI superimposed on CKD stage III --Likely related to n.p.o. status and surgery.  Poor appetite today.  --IV fluids.  Recheck BMP in a.m.  Hypothyroidism  --Continue levothyroxine   PAF. CHA2DS2-VASc 5. Not on anticoagulation, poor candidate per Dr. Jackalyn Lombard note 07/07/2017  Alzheimer's dementia --Appears stable  DVT prophylaxis: enoxaparin Code Status: Full Family Communication: none Disposition Plan: SNF   Murray Hodgkins, MD  [facility] Direct contact: [phone] --Via [place]  --[email or web address]; password [email or web address]  7PM-7AM contact night coverage as above 04/14/2018, 3:41 PM  LOS: 2 days   Consultants:  Orthopedics   Procedures:    Antimicrobials:    Interval history/Subjective: Feeling well, pain controlled.  Objective: Vitals:  Vitals:   04/14/18 1213 04/14/18 1500  BP: (!) 95/50 (!) 94/50  Pulse: 69 76  Resp:    Temp:  97.8 F (36.6 C)  SpO2:  95%    Exam: Constitutional:   . Appears calm and comfortable Respiratory:  . CTA bilaterally, no w/r/r.  . Respiratory effort normal. N Cardiovascular:  . RRR, no m/r/g Psychiatric:  . Mental status o Mood, affect appropriate  I have personally reviewed the following:   Data: Creatinine increased, 1.49, BUN increased, 25.  Sodium 133. Hemoglobin down, 9.2.  Remainder CBC unremarkable.  Scheduled Meds: . enoxaparin (LOVENOX) injection  30 mg Subcutaneous Daily  . levothyroxine  137 mcg Oral Q0600  . sertraline  25 mg Oral Daily   Continuous  Infusions: . sodium chloride 125 mL/hr at 04/14/18 1036  . lactated ringers 10 mL/hr at 04/13/18 1308    Principal Problem:   Femoral neck fracture (HCC) Active Problems:   Hypothyroid   CKD (chronic kidney disease) stage 3, GFR 30-59 ml/min (HCC)   Atrial fibrillation (HCC)   Pressure injury of skin   LOS: 2 days

## 2018-04-14 NOTE — Evaluation (Signed)
Physical Therapy Evaluation Patient Details Name: Morgan Weaver MRN: [id number] DOB: [birth year] Today's Date: 04/14/2018   History of Present Illness  84 yo admitted after fall with right femoral neck fx s/p Rt hip hemiarthoplasty 10/30 . PMHx: HTN, HLD, hypothyroidism, A. fib, CKD 3, Alzheimers  Clinical Impression  Pt pleasant on arrival and reports no pain. Pt unable to recall precautions even seconds later and disoriented to time and place. Pt with decreased strength, mobility, balance and function who will benefit from acute therapy to maximize mobility, safety and function adhering to precautions to decrease burden of care. Family and aide available grossly 20hrs/day but cannot provide 24hr assist, inaccessible home and pt cognitive deficits make SNF the safest D/C option.     Follow Up Recommendations SNF;Supervision/Assistance - 24 hour(family desires [facility])    Equipment Recommendations  3in1 (PT)    Recommendations for Other Services       Precautions / Restrictions Precautions Precautions: Posterior Hip;Fall Precaution Booklet Issued: Yes (comment) Required Braces or Orthoses: Other Brace/Splint Other Brace/Splint: hip abduction pillow at rest Restrictions RLE Weight Bearing: Weight bearing as tolerated      Mobility  Bed Mobility Overal bed mobility: Needs Assistance Bed Mobility: Supine to Sit;Sit to Supine     Supine to sit: Max assist Sit to supine: Max assist   General bed mobility comments: max assist to move bil LE to EOB with assist of pad and RLE blocked, pt assisting with rail and HHA to elevate trunk from surface. Max assist to pivot back into bed, total assist to scoot to [facility]  Transfers Overall transfer level: Needs assistance   Transfers: Sit to/from Stand Sit to Stand: From elevated surface;Max assist         General transfer comment: max assist with elevated bed, RLE blocked and cues for sequence with assist to rise. PT able to clear sacrum  from surface but could not fully stand  Ambulation/Gait             General Gait Details: unable  Stairs            Wheelchair Mobility    Modified Rankin (Stroke Patients Only)       Balance Overall balance assessment: History of Falls                                           Pertinent Vitals/Pain Pain Assessment: 0-10 Pain Score: 8  Pain Location: in bed not moving 0/10, with movement reports 8/10 pain  Pain Descriptors / Indicators: Aching;Guarding Pain Intervention(s): Patient requesting pain meds-RN notified;Monitored during session;Limited activity within patient's tolerance;Repositioned    Home Living Family/patient expects to be discharged to:: Private residence Living Arrangements: Children Available Help at Discharge: Family;Available PRN/intermittently;Personal care attendant Type of Home: House Home Access: Stairs to enter Entrance Stairs-Rails: Psychiatric nurse of Steps: 2 Home Layout: One level Home Equipment: Walker - 2 wheels;Shower seat      Prior Function Level of Independence: Independent with assistive device(s);Needs assistance   Gait / Transfers Assistance Needed: is supposed to use RW all the time but frequently forgets  ADL's / Homemaking Assistance Needed: aide for bathing, family does the homemaking  Comments: aide 8-2 7 days a week, son in and out     Hand Dominance        Extremity/Trunk Assessment   Upper Extremity Assessment Upper Extremity Assessment:  Generalized weakness    Lower Extremity Assessment Lower Extremity Assessment: Generalized weakness;RLE deficits/detail;Difficult to assess due to impaired cognition RLE Deficits / Details: very limited AROM due to pain RLE: Unable to fully assess due to pain    Cervical / Trunk Assessment Cervical / Trunk Assessment: Kyphotic  Communication   Communication: No difficulties  Cognition Arousal/Alertness: Awake/alert Behavior  During Therapy: WFL for tasks assessed/performed Overall Cognitive Status: History of cognitive impairments - at baseline                                        General Comments      Exercises     Assessment/Plan    PT Assessment Patient needs continued PT services  PT Problem List Decreased strength;Decreased mobility;Decreased safety awareness;Decreased range of motion;Decreased coordination;Decreased knowledge of precautions;Decreased activity tolerance;Decreased cognition;Decreased balance;Decreased knowledge of use of DME;Pain       PT Treatment Interventions Gait training;Therapeutic activities;Stair training;Therapeutic exercise;Cognitive remediation;Balance training;Functional mobility training;DME instruction;Patient/family education    PT Goals (Current goals can be found in the Care Plan section)  Acute Rehab PT Goals Patient Stated Goal: walk, "ride horses- per pt" PT Goal Formulation: With patient/family Time For Goal Achievement: 04/28/18 Potential to Achieve Goals: Fair    Frequency Min 3X/week   Barriers to discharge Decreased caregiver support      Co-evaluation               AM-PAC PT "6 Clicks" Daily Activity  Outcome Measure Difficulty turning over in bed (including adjusting bedclothes, sheets and blankets)?: Unable Difficulty moving from lying on back to sitting on the side of the bed? : Unable Difficulty sitting down on and standing up from a chair with arms (e.g., wheelchair, bedside commode, etc,.)?: Unable Help needed moving to and from a bed to chair (including a wheelchair)?: Total Help needed walking in hospital room?: Total Help needed climbing 3-5 steps with a railing? : Total 6 Click Score: 6    End of Session Equipment Utilized During Treatment: Gait belt;Other (comment)(hip abduction pillow in supine) Activity Tolerance: Patient limited by pain Patient left: in bed;with call bell/phone within reach;with bed alarm  set;with family/visitor present Nurse Communication: Mobility status;Precautions;Patient requests pain meds;Weight bearing status PT Visit Diagnosis: Other abnormalities of gait and mobility (R26.89);Muscle weakness (generalized) (M62.81);Pain;History of falling (Z91.81) Pain - Right/Left: Right Pain - part of body: Hip    Time: 1340-1407 PT Time Calculation (min) (ACUTE ONLY): 27 min   Charges:   PT Evaluation $PT Eval Moderate Complexity: 1 Mod PT Treatments $Therapeutic Activity: 8-22 mins        Kaven Cumbie Pam Drown, PT Acute Rehabilitation Services Pager: [phone] Office: [phone]   Kaelem Brach B Real Cona 04/14/2018, 2:25 PM

## 2018-04-14 NOTE — Progress Notes (Signed)
Pt with low BP and no urine output today. Dr Sarajane Jews notified and came in and reassessed the pt. IVF boluses given. Pt is alert, ate 50% for dinner.  Will cont to monitor.

## 2018-04-15 ENCOUNTER — Inpatient Hospital Stay (HOSPITAL_COMMUNITY): Payer: Medicare Other

## 2018-04-15 LAB — URINALYSIS, ROUTINE W REFLEX MICROSCOPIC
BILIRUBIN URINE: NEGATIVE
GLUCOSE, UA: NEGATIVE mg/dL
KETONES UR: NEGATIVE mg/dL
LEUKOCYTES UA: NEGATIVE
NITRITE: NEGATIVE
Protein, ur: NEGATIVE mg/dL
Specific Gravity, Urine: 1.017 (ref 1.005–1.030)
pH: 5 (ref 5.0–8.0)

## 2018-04-15 LAB — BASIC METABOLIC PANEL
Anion gap: 4 — ABNORMAL LOW (ref 5–15)
BUN: 34 mg/dL — AB (ref 8–23)
CO2: 19 mmol/L — ABNORMAL LOW (ref 22–32)
CREATININE: 1.82 mg/dL — AB (ref 0.44–1.00)
Calcium: 7.9 mg/dL — ABNORMAL LOW (ref 8.9–10.3)
Chloride: 113 mmol/L — ABNORMAL HIGH (ref 98–111)
GFR calc Af Amer: 28 mL/min — ABNORMAL LOW (ref >60)
GFR, EST NON AFRICAN AMERICAN: 24 mL/min — AB (ref >60)
GLUCOSE: 125 mg/dL — AB (ref 70–99)
POTASSIUM: 3.9 mmol/L (ref 3.5–5.1)
Sodium: 136 mmol/L (ref 135–145)

## 2018-04-15 LAB — CBC
HCT: 26.2 % — ABNORMAL LOW (ref 36.0–46.0)
HEMOGLOBIN: 8.5 g/dL — AB (ref 12.0–15.0)
MCH: 31 pg (ref 26.0–34.0)
MCHC: 32.4 g/dL (ref 30.0–36.0)
MCV: 95.6 fL (ref 80.0–100.0)
Platelets: 100 10*3/uL — ABNORMAL LOW (ref 150–400)
RBC: 2.74 MIL/uL — ABNORMAL LOW (ref 3.87–5.11)
RDW: 12.9 % (ref 11.5–15.5)
WBC: 7.5 10*3/uL (ref 4.0–10.5)
nRBC: 0 % (ref 0.0–0.2)

## 2018-04-15 LAB — CREATININE, URINE, RANDOM: CREATININE, URINE: 175.23 mg/dL

## 2018-04-15 LAB — SODIUM, URINE, RANDOM: Sodium, Ur: 96 mmol/L

## 2018-04-15 MED ORDER — SODIUM CHLORIDE 0.9 % IV BOLUS
1000.0000 mL | Freq: Once | INTRAVENOUS | Status: AC
  Administered 2018-04-15: 1000 mL via INTRAVENOUS

## 2018-04-15 NOTE — NC FL2 (Signed)
[place] LEVEL OF CARE SCREENING TOOL     IDENTIFICATION  Patient Name: Morgan Weaver Birthdate: 1928/07/06 Sex: female Admission Date (Current Location): 04/12/2018  [facility] and [place] Number:  Herbalist and Address:  [facility] [place], [place] [address] [place], [place] [address]      Provider Number: [id number]  Attending Physician Name and Address:  Samuella Cota, MD  Relative Name and Phone Number:       Current Level of Care: Hospital Recommended Level of Care: [place] Prior Approval Number:    Date Approved/Denied:   PASRR Number: [id number]  Discharge Plan: SNF    Current Diagnoses: Patient Active Problem List   Diagnosis Date Noted  . AKI (acute kidney injury) ([place]) 04/14/2018  . Acute blood loss anemia 04/14/2018  . Pressure injury of skin 04/13/2018  . Femoral neck fracture ([place]) 04/12/2018  . Chronic diastolic CHF (congestive heart failure) ([place]) 04/12/2018  . CKD (chronic kidney disease) stage 3, GFR 30-59 ml/min (HCC) 04/12/2018  . Depression 04/12/2018  . Atrial fibrillation ([place]) 04/12/2018  . Dementia with behavioral disturbance ([place]) 01/02/2018  . Urticaria 08/04/2017  . Sinus bradycardia   . Syncope 03/03/2017  . Atrial fibrillation with RVR ([place])   . Heart block   . Transient hypotension   . Paroxysmal atrial fibrillation (HCC)   . Sinus pause   . Hypokalemia   . Dementia ([place]) 02/15/2015  . Memory loss 08/01/2014  . B12 deficiency 08/01/2014  . Hyponatremia 03/18/2013  . Closed left hip fracture ([place]) 03/16/2013  . Thrombocytopenia, unspecified ([place]) 11/17/2012  . Leukopenia 11/17/2012  . Syncope and collapse 11/16/2012  . Acute UTI 11/16/2012  . Hypertension 11/16/2012  . Hypothyroid 11/16/2012  . Rash 11/16/2012  . Pruritus ani 04/16/2011    Orientation RESPIRATION BLADDER Height & Weight     Self, Time, Situation, Place  Normal Indwelling catheter Weight: 146  lb 9.7 oz (66.5 kg) Height:     BEHAVIORAL SYMPTOMS/MOOD NEUROLOGICAL BOWEL NUTRITION STATUS  (None) (Dementia) Incontinent Diet(Regular)  AMBULATORY STATUS COMMUNICATION OF NEEDS Skin   Extensive Assist Verbally PU Stage and Appropriate Care, Surgical wounds, Other (Comment)(Rash, Skin tear.) PU Stage 1 Dressing: No Dressing(Upper coccyx.)                     Personal Care Assistance Level of Assistance  Bathing, Feeding, Dressing Bathing Assistance: Maximum assistance Feeding assistance: Limited assistance Dressing Assistance: Maximum assistance     Functional Limitations Info  Sight, Hearing, Speech Sight Info: Adequate Hearing Info: Adequate Speech Info: Adequate    SPECIAL CARE FACTORS FREQUENCY  PT (By licensed PT), OT (By licensed OT)     PT Frequency: 5 x week OT Frequency: 5 x week            Contractures Contractures Info: Not present    Additional Factors Info  Code Status, Allergies Code Status Info: Full Allergies Info: Exelon (Rivastigmine Tartrate), Aricept (Donepezil Hcl), Mobic (Meloxicam), Namenda (Memantine Hcl), Tetracyclines & Related, Vancomycin, Tape, Ancef (Cefazolin), Sulfa Drugs Cross Reactors           Current Medications (04/15/2018):  This is the current hospital active medication list Current Facility-Administered Medications  Medication Dose Route Frequency Provider Last Rate Last Dose  . 0.9 %  sodium chloride infusion   Intravenous Continuous Samuella Cota, MD 125 mL/hr at 04/15/18 1631    . acetaminophen (TYLENOL) tablet 650 mg  650 mg  Oral Q6H PRN Samuella Cota, MD   650 mg at 04/15/18 1340  . HYDROcodone-acetaminophen (NORCO/VICODIN) 5-325 MG per tablet 1-2 tablet  1-2 tablet Oral Q6H PRN Haddix, Thomasene Lot, MD   1 tablet at 04/14/18 1413  . lactated ringers infusion   Intravenous Continuous Haddix, Thomasene Lot, MD 10 mL/hr at 04/13/18 1308    . levothyroxine (SYNTHROID, LEVOTHROID) tablet 137 mcg  137 mcg Oral Q0600 Shona Needles, MD   137 mcg at 04/15/18 0602  . morphine 2 MG/ML injection 0.5 mg  0.5 mg Intravenous Q2H PRN Haddix, Thomasene Lot, MD   0.5 mg at 04/13/18 1108  . sertraline (ZOLOFT) tablet 25 mg  25 mg Oral Daily Haddix, Thomasene Lot, MD   25 mg at 04/15/18 1121     Discharge Medications: Please see discharge summary for a list of discharge medications.  Relevant Imaging Results:  Relevant Lab Results:   Additional Information SS#: [id number]  Candie Chroman, LCSW

## 2018-04-15 NOTE — Evaluation (Signed)
Occupational Therapy Evaluation Patient Details Name: Morgan Weaver MRN: [id number] DOB: 11-08-1928 Today's Date: 04/15/2018    History of Present Illness 84 yo admitted after fall with right femoral neck fx s/p Rt hip hemiarthoplasty 10/30 . PMHx: HTN, HLD, hypothyroidism, A. fib, CKD 3, Alzheimers   Clinical Impression   PTA Pt mod I for mobility with RW (semi-compliant) had an aide 7 days a week that assisted with ADL. Pt is currently max A +2 for bed mobility - too painful to attempt transfers this session . Total A for all LB ADL and no recall of precautions. Pt is only oriented to self - no recall of fall or sx "I am sick". Pt will require skilled OT in the acute setting as well as afterwards at the SNF level. Pt did enjoy [place] and was tapping foot at one point during session. Next session to focus on transfers, and grooming activities while working on seated balance.  Of note: due to increase in falls, decrease in cognition - it would be very pertinent to consult Palliative medicine to help the family and patient establish a plan of care for long term goals in chronic disease management.     Follow Up Recommendations  SNF;Supervision/Assistance - 24 hour    Equipment Recommendations  Other (comment)(defer to next venue)    Recommendations for Other Services Other (comment)(Palliative Medicine Consult)     Precautions / Restrictions Precautions Precautions: Posterior Hip;Fall Precaution Booklet Issued: Yes (comment) Precaution Comments: posted in room, Pt with no recall Required Braces or Orthoses: Other Brace/Splint Other Brace/Splint: hip abduction pillow at rest Restrictions Weight Bearing Restrictions: Yes RLE Weight Bearing: Weight bearing as tolerated      Mobility Bed Mobility Overal bed mobility: Needs Assistance Bed Mobility: Supine to Sit;Sit to Supine     Supine to sit: Max assist;+2 for physical assistance;+2 for safety/equipment Sit to  supine: Total assist;+2 for physical assistance;+2 for safety/equipment   General bed mobility comments: helicopter technique utilized, Pt did grab the rail and assist, heavy use of bed pad.  Transfers                 General transfer comment: NT this session    Balance Overall balance assessment: History of Falls                                         ADL either performed or assessed with clinical judgement   ADL Overall ADL's : Needs assistance/impaired Eating/Feeding: Set up Eating/Feeding Details (indicate cue type and reason): currently not eating right now. She states that she will NOT eat hospital food Grooming: Set up;Bed level   Upper Body Bathing: Moderate assistance;Sitting;Bed level Upper Body Bathing Details (indicate cue type and reason): washed back in sitting with max A for support Lower Body Bathing: Total assistance   Upper Body Dressing : Maximal assistance   Lower Body Dressing: Total assistance   Toilet Transfer: (bed level at this time, using external urinary catheter)   Toileting- Clothing Manipulation and Hygiene: Total assistance;Bed level Toileting - Clothing Manipulation Details (indicate cue type and reason): especially for rear peri care     Functional mobility during ADLs: (NT at this time, Pt unable) General ADL Comments: Pt with no recall of posterior hip precautions     Vision         Perception  Praxis      Pertinent Vitals/Pain Pain Assessment: Faces Faces Pain Scale: Hurts even more Pain Location: R hip Pain Descriptors / Indicators: Aching;Guarding Pain Intervention(s): Monitored during session;Repositioned;RN gave pain meds during session     Hand Dominance Right   Extremity/Trunk Assessment Upper Extremity Assessment Upper Extremity Assessment: Generalized weakness   Lower Extremity Assessment Lower Extremity Assessment: Defer to PT evaluation   Cervical / Trunk Assessment Cervical /  Trunk Assessment: Kyphotic   Communication Communication Communication: No difficulties   Cognition Arousal/Alertness: Lethargic(eyes closed frequently throughout session) Behavior During Therapy: Agitated(fluctuated throughout session) Overall Cognitive Status: History of cognitive impairments - at baseline                                 General Comments: Pt oriented to self, and that she is "sick" she does not remember the fall or that she had sx, could not tell me where she was even when provided options, did recognize sons   General Comments  Sons Marya Amsler and ??? stepping out of the room during session, but eager for information afterwards. Introduced the idea of Palliative Medicine    Exercises     Shoulder Instructions      Home Living Family/patient expects to be discharged to:: Skilled nursing facility Living Arrangements: Children Available Help at Discharge: Family;Available PRN/intermittently;Personal care attendant Type of Home: House Home Access: Stairs to enter [employer] of Steps: 2 Entrance Stairs-Rails: Right;Left Home Layout: One level     Bathroom Shower/Tub: Occupational psychologist: Standard     Home Equipment: Environmental consultant - 2 wheels;Shower seat          Prior Functioning/Environment Level of Independence: Independent with assistive device(s);Needs assistance  Gait / Transfers Assistance Needed: is supposed to use RW all the time but frequently forgets ADL's / Homemaking Assistance Needed: aide for bathing, family does the homemaking   Comments: aide 8-2 7 days a week, son in and out        OT Problem List: Decreased strength;Decreased range of motion;Decreased activity tolerance;Impaired balance (sitting and/or standing);Decreased cognition;Decreased safety awareness;Decreased knowledge of precautions;Pain      OT Treatment/Interventions: Self-care/ADL training;Therapeutic activities;Patient/family education;Balance  training    OT Goals(Current goals can be found in the care plan section) Acute Rehab OT Goals Patient Stated Goal: walk, "ride horses- per pt" OT Goal Formulation: With patient Time For Goal Achievement: 04/29/18 Potential to Achieve Goals: Good  OT Frequency: Min 2X/week   Barriers to D/C:            Co-evaluation              AM-PAC PT "6 Clicks" Daily Activity     Outcome Measure Help from another person eating meals?: A Little Help from another person taking care of personal grooming?: A Little Help from another person toileting, which includes using toliet, bedpan, or urinal?: Total Help from another person bathing (including washing, rinsing, drying)?: Total Help from another person to put on and taking off regular upper body clothing?: A Lot Help from another person to put on and taking off regular lower body clothing?: Total 6 Click Score: 11   End of Session Equipment Utilized During Treatment: Other (comment)(hip abduction pillow) Nurse Communication: Mobility status;Precautions;Weight bearing status  Activity Tolerance: Patient limited by pain Patient left: in bed;with call bell/phone within reach;with bed alarm set;with family/visitor present;with SCD's reapplied;Other (comment)(hip abduction pillow back in  place)  OT Visit Diagnosis: Unsteadiness on feet (R26.81);Other abnormalities of gait and mobility (R26.89);Repeated falls (R29.6);History of falling (Z91.81);Muscle weakness (generalized) (M62.81);Other symptoms and signs involving cognitive function;Pain Pain - Right/Left: Right Pain - part of body: Hip                Time: 4159-7331 OT Time Calculation (min): 27 min Charges:  OT General Charges $OT Visit: 1 Visit OT Evaluation $OT Eval Moderate Complexity: [place] OTR/L Acute Rehabilitation Services Pager: [phone] Office: [phone]  Merri Ray Pierson Vantol 04/15/2018, 4:43 PM

## 2018-04-15 NOTE — Progress Notes (Signed)
Bladder scanned pt d/t no urinary output. Bladder scan shows less than 176ml of urine. Pt removed on IVF per orders and PO fluids encouraged. Purewick in place and will continue to monitor.

## 2018-04-15 NOTE — Care Management Important Message (Signed)
Important Message  Patient Details  Name: Morgan Weaver MRN: [id number] Date of Birth: 12-09-1928   Medicare Important Message Given:  Yes    Tarin Johndrow P Malinda Mayden 04/15/2018, 4:05 PM

## 2018-04-15 NOTE — Progress Notes (Signed)
Physical Therapy Treatment Patient Details Name: Morgan Weaver MRN: [id number] DOB: 19-Aug-1928 Today's Date: 04/15/2018    History of Present Illness 84 yo admitted after fall with right femoral neck fx s/p Rt hip hemiarthoplasty 10/30 . PMHx: HTN, HLD, hypothyroidism, A. fib, CKD 3, Alzheimers    PT Comments    Patient seen for mobility progression. Pt requires max/total A +2 for bed mobility. Unable to progress to OOB transfer due to anxiety/fear of falling and pain. Sons present and supportive and encouraging pt to attempt mobilizing. Continue to progress as tolerated with anticipated d/c to SNF for further skilled PT services.     Follow Up Recommendations  SNF;Supervision/Assistance - 24 hour     Equipment Recommendations  3in1 (PT)    Recommendations for Other Services       Precautions / Restrictions Precautions Precautions: Posterior Hip;Fall Precaution Booklet Issued: Yes (comment) Precaution Comments: posted in room, Pt with no recall Required Braces or Orthoses: Other Brace/Splint Other Brace/Splint: hip abduction pillow at rest Restrictions Weight Bearing Restrictions: Yes RLE Weight Bearing: Weight bearing as tolerated    Mobility  Bed Mobility Overal bed mobility: Needs Assistance Bed Mobility: Supine to Sit;Sit to Supine     Supine to sit: Max assist;+2 for physical assistance;+2 for safety/equipment Sit to supine: Total assist;+2 for physical assistance;+2 for safety/equipment   General bed mobility comments: cues for sequencing and for use of rail to assist in getting to EOB; assist to bring bilat LE/hips to EOB and to elevate trunk into sitting  Transfers                 General transfer comment: NT this session  Ambulation/Gait                 Stairs             Wheelchair Mobility    Modified Rankin (Stroke Patients Only)       Balance Overall balance assessment: History of Falls Sitting-balance support: Bilateral  upper extremity supported;Feet supported Sitting balance-Leahy Scale: Poor Sitting balance - Comments: assist required to get to midline posture as pt leans toward L side away from painful hip Postural control: Left lateral lean                                  Cognition Arousal/Alertness: Lethargic(eyes closed frequently throughout session) Behavior During Therapy: Agitated(fluctuated throughout session) Overall Cognitive Status: History of cognitive impairments - at baseline                                 General Comments: Pt oriented to self, and that she is "sick" she does not remember the fall or that she had sx, could not tell me where she was even when provided options, did recognize sons      Exercises      General Comments General comments (skin integrity, edema, etc.): Sons Greg and ??? stepping out of the room during session, but eager for information afterwards. Introduced the idea of Palliative Medicine      Pertinent Vitals/Pain Pain Assessment: Faces Faces Pain Scale: Hurts even more Pain Location: R hip Pain Descriptors / Indicators: Aching;Guarding Pain Intervention(s): Monitored during session;Limited activity within patient's tolerance;Repositioned;RN gave pain meds during session    Home Living Family/patient expects to be discharged to:: Skilled nursing facility Living Arrangements: Children  Available Help at Discharge: Family;Available PRN/intermittently;Personal care attendant Type of Home: House Home Access: Stairs to enter Entrance Stairs-Rails: Right;Left Home Layout: One level Home Equipment: Environmental consultant - 2 wheels;Shower seat      Prior Function Level of Independence: Independent with assistive device(s);Needs assistance  Gait / Transfers Assistance Needed: is supposed to use RW all the time but frequently forgets ADL's / Homemaking Assistance Needed: aide for bathing, family does the homemaking Comments: aide 8-2 7 days a  week, son in and out   PT Goals (current goals can now be found in the care plan section) Acute Rehab PT Goals Patient Stated Goal: walk, "ride horses- per pt" Progress towards PT goals: Not progressing toward goals - comment(pain and confusion)    Frequency    Min 3X/week      PT Plan Current plan remains appropriate    Co-evaluation PT/OT/SLP Co-Evaluation/Treatment: Yes Reason for Co-Treatment: Necessary to address cognition/behavior during functional activity;For patient/therapist safety;To address functional/ADL transfers PT goals addressed during session: Mobility/safety with mobility;Balance;Strengthening/ROM        AM-PAC PT "6 Clicks" Daily Activity  Outcome Measure  Difficulty turning over in bed (including adjusting bedclothes, sheets and blankets)?: Unable Difficulty moving from lying on back to sitting on the side of the bed? : Unable Difficulty sitting down on and standing up from a chair with arms (e.g., wheelchair, bedside commode, etc,.)?: Unable Help needed moving to and from a bed to chair (including a wheelchair)?: Total Help needed walking in hospital room?: Total Help needed climbing 3-5 steps with a railing? : Total 6 Click Score: 6    End of Session Equipment Utilized During Treatment: Other (comment)(hip abduction pillow) Activity Tolerance: Patient limited by pain;Treatment limited secondary to agitation Patient left: in bed;with call bell/phone within reach;with bed alarm set Nurse Communication: Mobility status;Precautions PT Visit Diagnosis: Other abnormalities of gait and mobility (R26.89);Muscle weakness (generalized) (M62.81);Pain;History of falling (Z91.81) Pain - Right/Left: Right Pain - part of body: Hip     Time: 1423-9532 PT Time Calculation (min) (ACUTE ONLY): 27 min  Charges:  $Therapeutic Activity: 8-22 mins                     Earney Navy, PTA Acute Rehabilitation Services Pager: [phone] Office: [phone]      Darliss Cheney 04/15/2018, 4:56 PM

## 2018-04-15 NOTE — Progress Notes (Addendum)
BUN 35, cr 2.10, pt has no urine output since yesterday. Dr Sarajane Jews notified.  2508 Foley cath inserted, dark amber urine noted, 216ml.

## 2018-04-15 NOTE — Plan of Care (Signed)

## 2018-04-15 NOTE — Clinical Social Work Note (Signed)
Clinical Social Work Assessment  Patient Details  Name: Morgan Weaver MRN: [id number] Date of Birth: 1929-01-19  Date of referral:  04/15/18               Reason for consult:  Facility Placement, Discharge Planning                Permission sought to share information with:  Facility Sport and exercise psychologist, Family Supports Permission granted to share information::     Name::     Tashonna Descoteaux  Agency::  [facility]  Relationship::  Son  Contact Information:  [phone]  Housing/Transportation Living arrangements for the past 2 months:  Post of Information:  Medical Team, Adult Children Patient Interpreter Needed:  None Criminal Activity/Legal Involvement Pertinent to Current Situation/Hospitalization:  No - Comment as needed Significant Relationships:  Adult Children Lives with:  Adult Children Do you feel safe going back to the place where you live?  Yes Need for family participation in patient care:  Yes (Comment)  Care giving concerns:  PT recommending SNF once medically stable for discharge.   Social Worker assessment / plan:  Patient not fully oriented. No supports at bedside. CSW called patient's son, introduced role, and explained that PT recommendations would be discussed. Patient's son agreeable to SNF placement. First preference is [facility] due to proximity to family. Sent referral and left message for admissions coordinator. No further concerns. CSW encouraged patient's son to contact CSW as needed. CSW will continue to follow patient and her son for support and facilitate discharge to SNF once medically stable.  Employment status:  Retired Nurse, adult PT Recommendations:  [place] / Referral to community resources:  [place]  Patient/Family's Response to care:  Patient not fully oriented. Patient's son agreeable to SNF placement. Patient's family  supportive and involved in patient's care. Patient's son appreciated social work intervention.  Patient/Family's Understanding of and Emotional Response to Diagnosis, Current Treatment, and Prognosis:  Patient not fully oriented. Patient's son has a good understanding of the reason for admission and her need for rehab prior to returning home. Patient's son appears happy with hospital care.  Emotional Assessment Appearance:  Appears stated age Attitude/Demeanor/Rapport:  Unable to Assess Affect (typically observed):  Unable to Assess Orientation:  Oriented to Self Alcohol / Substance use:  Never Used Psych involvement (Current and /or in the community):  No (Comment)  Discharge Needs  Concerns to be addressed:  Care Coordination Readmission within the last 30 days:  No Current discharge risk:  Dependent with Mobility, Cognitively Impaired Barriers to Discharge:  Continued Medical Work up, [place], LCSW 04/15/2018, 5:05 PM

## 2018-04-15 NOTE — Clinical Social Work Placement (Signed)
   CLINICAL SOCIAL WORK PLACEMENT  NOTE  Date:  04/15/2018  Patient Details  Name: Morgan Weaver MRN: [id number] Date of Birth: 06-Feb-1929  Clinical Social Work is seeking post-discharge placement for this patient at the [place] level of care (*CSW will initial, date and re-position this form in  chart as items are completed):      Patient/family provided with [place] Work Department's list of facilities offering this level of care within the geographic area requested by the patient (or if unable, by the patient's family).      Patient/family informed of their freedom to choose among providers that offer the needed level of care, that participate in Medicare, Medicaid or managed care program needed by the patient, have an available bed and are willing to accept the patient.      Patient/family informed of [facility]'s ownership interest in [facility], as well as of the fact that they are under no obligation to receive care at these facilities.  PASRR submitted to EDS on 04/15/18     PASRR number received on       Existing PASRR number confirmed on 04/15/18     FL2 transmitted to all facilities in geographic area requested by pt/family on 04/15/18     FL2 transmitted to all facilities within larger geographic area on       Patient informed that his/her managed care company has contracts with or will negotiate with certain facilities, including the following:            Patient/family informed of bed offers received.  Patient chooses bed at       Physician recommends and patient chooses bed at      Patient to be transferred to   on  .  Patient to be transferred to facility by       Patient family notified on   of transfer.  Name of family member notified:        PHYSICIAN Please sign FL2     Additional Comment:    _______________________________________________ Candie Chroman, LCSW 04/15/2018, 5:08  PM

## 2018-04-15 NOTE — Progress Notes (Signed)
  PROGRESS NOTE  Morgan Weaver [id number] DOB: 1928/10/25 DOA: 04/12/2018 PCP: Leonard Downing, MD  Brief Narrative: 89yow sustained fall, presented with pain, admitted for right femoral neck fracture.  Assessment/Plan Right femoral neck fracture secondary to fall, likely mechanical. S/p surgery 10/30 --Continue management per orthopedics.  Acute blood loss anemia secondary to surgery. --Appears asymptomatic, hemoglobin trending down, repeat CBC pending  AKI superimposed on CKD stage III --Thought secondary to n.p.o. status, surgery, probably complicated by hypotension yesterday.  Urine output poor.  Urinalysis unrevealing.  Renal ultrasound unremarkable. --Aggressive IV fluids.  Foley catheter placed with immediate return of approximately 150 mL.  Strict I/O.  Not on any likely nephrotoxins or antibiotics. --BMP pending.  If significantly worse, will consider nephrology consultation.  Hypothyroidism  --Continue levothyroxine   PAF. CHA2DS2-VASc 5. Not on anticoagulation, poor candidate per Dr. Jackalyn Lombard note 07/07/2017  Alzheimer's dementia with acute confusion --Now confused.  Will monitor.  DVT prophylaxis: enoxaparin Code Status: Full Family Communication: none Disposition Plan: SNF   Murray Hodgkins, MD  [facility] Direct contact: [phone] --Via amion app OR  --[email or web address]; password [email or web address]  7PM-7AM contact night coverage as above 04/15/2018, 2:01 PM  LOS: 3 days   Consultants:  Orthopedics   Procedures:    Antimicrobials:    Interval history/Subjective: Poor urine output overnight.  Confused overnight.  Confused this morning, thinks she is at home.  Answers yes to most questions.  Objective: Vitals:  Vitals:   04/15/18 0548 04/15/18 0737  BP: (!) 122/50 (!) 96/48  Pulse: 76 69  Resp: 12 16  Temp: 98.7 F (37.1 C)   SpO2: 92% 93%    Exam: Constitutional:   . Appears a bit anxious, becomes agitated with questioning Eyes:    . pupils and irises appear normal ENMT:  . grossly normal hearing  Respiratory:  . CTA bilaterally, no w/r/r.  . Respiratory effort normal.  Cardiovascular:  . RRR, no m/r/g . No significant LE extremity edema   Abdomen:  . Soft Musculoskeletal:  . RLE, LLE   . strength and tone grossly normal Psychiatric:  . Mental status . Confused, odd affect, anxious   I have personally reviewed the following:   Data:  No urine output recorded.  Foley catheter was placed with return of approximately 150 mL.  BUN up to 35, creatinine up to 2.1 last night, potassium within normal limits.  Hemoglobin trending down last night 8.3, platelets down to 115 last night  Scheduled Meds: . levothyroxine  137 mcg Oral Q0600  . sertraline  25 mg Oral Daily   Continuous Infusions: . sodium chloride 125 mL/hr at 04/15/18 0739  . lactated ringers 10 mL/hr at 04/13/18 1308    Principal Problem:   Femoral neck fracture (HCC) Active Problems:   Hypothyroid   CKD (chronic kidney disease) stage 3, GFR 30-59 ml/min (HCC)   Atrial fibrillation (HCC)   Pressure injury of skin   AKI (acute kidney injury) ([place])   Acute blood loss anemia   LOS: 3 days

## 2018-04-15 NOTE — Plan of Care (Signed)
  Problem: Clinical Measurements: Goal: Ability to maintain clinical measurements within normal limits will improve Outcome: Progressing Goal: Will remain free from infection Outcome: Progressing   Problem: Pain Managment: Goal: General experience of comfort will improve Outcome: Progressing   Problem: Safety: Goal: Ability to remain free from injury will improve Outcome: Progressing   

## 2018-04-16 LAB — CBC
HEMATOCRIT: 25 % — AB (ref 36.0–46.0)
Hemoglobin: 7.9 g/dL — ABNORMAL LOW (ref 12.0–15.0)
MCH: 29.8 pg (ref 26.0–34.0)
MCHC: 31.6 g/dL (ref 30.0–36.0)
MCV: 94.3 fL (ref 80.0–100.0)
NRBC: 0 % (ref 0.0–0.2)
PLATELETS: 114 10*3/uL — AB (ref 150–400)
RBC: 2.65 MIL/uL — AB (ref 3.87–5.11)
RDW: 13 % (ref 11.5–15.5)
WBC: 6.8 10*3/uL (ref 4.0–10.5)

## 2018-04-16 LAB — BASIC METABOLIC PANEL
Anion gap: 3 — ABNORMAL LOW (ref 5–15)
BUN: 30 mg/dL — ABNORMAL HIGH (ref 8–23)
CALCIUM: 7.9 mg/dL — AB (ref 8.9–10.3)
CO2: 18 mmol/L — ABNORMAL LOW (ref 22–32)
CREATININE: 1.52 mg/dL — AB (ref 0.44–1.00)
Chloride: 115 mmol/L — ABNORMAL HIGH (ref 98–111)
GFR calc Af Amer: 34 mL/min — ABNORMAL LOW (ref >60)
GFR calc non Af Amer: 30 mL/min — ABNORMAL LOW (ref >60)
GLUCOSE: 104 mg/dL — AB (ref 70–99)
POTASSIUM: 3.6 mmol/L (ref 3.5–5.1)
SODIUM: 136 mmol/L (ref 135–145)

## 2018-04-16 MED ORDER — ENOXAPARIN SODIUM 40 MG/0.4ML ~~LOC~~ SOLN
40.0000 mg | SUBCUTANEOUS | Status: DC
  Administered 2018-04-17 – 2018-04-19 (×3): 40 mg via SUBCUTANEOUS
  Filled 2018-04-16 (×3): qty 0.4

## 2018-04-16 MED ORDER — HYDRALAZINE HCL 20 MG/ML IJ SOLN
10.0000 mg | Freq: Four times a day (QID) | INTRAMUSCULAR | Status: DC | PRN
  Administered 2018-04-16 – 2018-04-19 (×4): 10 mg via INTRAVENOUS
  Filled 2018-04-16 (×4): qty 1

## 2018-04-16 NOTE — Progress Notes (Signed)
  PROGRESS NOTE  Morgan Weaver [id number] DOB: [birth year] DOA: 04/12/2018 PCP: Leonard Downing, MD  Brief Narrative: 89yow sustained fall, presented with pain, admitted for right femoral neck fracture.  Assessment/Plan Right femoral neck fracture secondary to fall, likely mechanical. S/p surgery 10/30 --Appears stable.  Continue management per orthopedics.  Acute blood loss anemia secondary to surgery. --Asymptomatic but hemoglobin still trending down, may be equilibrating.  Repeat CBC in a.m.  No evidence to suggest bleeding.  AKI superimposed on CKD stage III.  Renal ultrasound unremarkable.  Urinalysis unremarkable. --Resolving.  Multifactorial, surgical and postoperative hypotension.  Urine output improving.  Renal ultrasound unremarkable thought secondary to n.p.o. status, surgery, probably complicated by hypotension yesterday.    --Continue IV fluids.  Voiding trial tomorrow. --BMP in a.m.  Hypothyroidism  --Continue levothyroxine   PAF. CHA2DS2-VASc 5. Not on anticoagulation, poor candidate per Dr. Jackalyn Lombard note 07/07/2017  Alzheimer's dementia --Confusion appears resolved.  If hemoglobin stabilizes renal function continues to improve, anticipate transfer to skilled nursing facility 11/30.  DVT prophylaxis: enoxaparin on hold, likely resume tomorrow Code Status: Full Family Communication: none Disposition Plan: SNF   Murray Hodgkins, MD  [facility] Direct contact: [phone] --Via amion app OR  --[email or web address]; password [email or web address]  7PM-7AM contact night coverage as above 04/16/2018, 12:36 PM  LOS: 4 days   Consultants:  Orthopedics   Procedures:    Antimicrobials:    Interval history/Subjective: Feels okay today.  No pain.  Objective: Vitals:  Vitals:   04/15/18 2255 04/16/18 0521  BP: (!) 142/64 (!) 148/64  Pulse: 72 73  Resp:    Temp: 98.1 F (36.7 C) 98.6 F (37 C)  SpO2: 95% 95%    Exam: Constitutional:   . Appears calm  and comfortable Respiratory:  . CTA bilaterally, no w/r/r.  . Respiratory effort normal.  Cardiovascular:  . RRR, no m/r/g . No LLE extremity edema   Psychiatric:  . Mental status o Mood, affect appropriate  I have personally reviewed the following:   Data:  Urine output 550  Hemoglobin trending down, 7.9, platelets stable 114.  Creatinine trending down, 1.52.  Scheduled Meds: . levothyroxine  137 mcg Oral Q0600  . sertraline  25 mg Oral Daily   Continuous Infusions: . sodium chloride 125 mL/hr at 04/16/18 0843  . lactated ringers 10 mL/hr at 04/13/18 1308    Principal Problem:   Femoral neck fracture (HCC) Active Problems:   Hypothyroid   CKD (chronic kidney disease) stage 3, GFR 30-59 ml/min (HCC)   Atrial fibrillation (HCC)   Pressure injury of skin   AKI (acute kidney injury) ([place])   Acute blood loss anemia   LOS: 4 days

## 2018-04-16 NOTE — Plan of Care (Signed)
  Problem: Education: Goal: Knowledge of General Education information will improve Description Including pain rating scale, medication(s)/side effects and non-pharmacologic comfort measures Outcome: Progressing   Problem: Activity: Goal: Risk for activity intolerance will decrease Outcome: Progressing   Problem: Pain Managment: Goal: General experience of comfort will improve Outcome: Progressing   Problem: Safety: Goal: Ability to remain free from injury will improve Outcome: Progressing   Problem: Skin Integrity: Goal: Risk for impaired skin integrity will decrease Outcome: Progressing   Problem: Elimination: Goal: Will not experience complications related to urinary retention Outcome: Completed/Met

## 2018-04-17 LAB — CBC
HEMATOCRIT: 27.6 % — AB (ref 36.0–46.0)
HEMOGLOBIN: 8.5 g/dL — AB (ref 12.0–15.0)
MCH: 29 pg (ref 26.0–34.0)
MCHC: 30.8 g/dL (ref 30.0–36.0)
MCV: 94.2 fL (ref 80.0–100.0)
NRBC: 0 % (ref 0.0–0.2)
Platelets: 150 10*3/uL (ref 150–400)
RBC: 2.93 MIL/uL — ABNORMAL LOW (ref 3.87–5.11)
RDW: 12.7 % (ref 11.5–15.5)
WBC: 7.2 10*3/uL (ref 4.0–10.5)

## 2018-04-17 LAB — BASIC METABOLIC PANEL
Anion gap: 5 (ref 5–15)
BUN: 23 mg/dL (ref 8–23)
CHLORIDE: 114 mmol/L — AB (ref 98–111)
CO2: 17 mmol/L — ABNORMAL LOW (ref 22–32)
CREATININE: 1.3 mg/dL — AB (ref 0.44–1.00)
Calcium: 7.9 mg/dL — ABNORMAL LOW (ref 8.9–10.3)
GFR calc non Af Amer: 36 mL/min — ABNORMAL LOW (ref >60)
GFR, EST AFRICAN AMERICAN: 42 mL/min — AB (ref >60)
Glucose, Bld: 88 mg/dL (ref 70–99)
POTASSIUM: 3.5 mmol/L (ref 3.5–5.1)
Sodium: 136 mmol/L (ref 135–145)

## 2018-04-17 MED ORDER — HYDROCODONE-ACETAMINOPHEN 5-325 MG PO TABS: 1.0000 | ORAL_TABLET | Freq: Four times a day (QID) | ORAL | 0 refills | Status: AC | PRN

## 2018-04-17 MED ORDER — AMLODIPINE BESYLATE 5 MG PO TABS
5.0000 mg | ORAL_TABLET | Freq: Every day | ORAL | Status: DC
  Administered 2018-04-17 – 2018-04-19 (×3): 5 mg via ORAL
  Filled 2018-04-17 (×3): qty 1

## 2018-04-17 MED ORDER — ENOXAPARIN SODIUM 40 MG/0.4ML ~~LOC~~ SOLN: 40.0000 mg | SUBCUTANEOUS | Status: AC

## 2018-04-17 NOTE — Plan of Care (Signed)

## 2018-04-17 NOTE — Discharge Summary (Addendum)
Physician Discharge Summary  RACHELLA BASDEN WYO:[id number] DOB: 10-24-1928 DOA: 04/12/2018  PCP: Leonard Downing, MD  Admit date: 04/12/2018 Discharge date: 04/19/2018  Recommendations for Outpatient Follow-up:   Right femoral neck fracture secondary to fall, likely mechanical. S/p surgery 10/30 Per orthopedics  Weightbearing: WBAT, posterior hip precautions for 6 weeks                  Insicional and dressing care: Keep dry until postoperative day 2                  Orthopedic device(s):Hip abduction pillow while in bed DVT prophylaxis with Lovenox, follow-up in 2 weeks with Dr. Doreatha Martin for incision check and x-rays  Essential hypertension. --Norvasc added.    Contact information for follow-up providers    Leonard Downing, MD Follow up.   Specialty:  Family Medicine Why:  as needed Contact information: [place] [address] [phone]        Shona Needles, MD. Schedule an appointment as soon as possible for a visit in 2 week(s).   Specialty:  Orthopedic Surgery Why:  incision check and x-rays Contact information: [address] [place] [address] [phone]            Contact information for after-discharge care    Destination    [facility] Preferred SNF .   Service:  Skilled Nursing Contact information: [place] [phone]                   Discharge Diagnoses:  1. Right femoral neck fracture secondary to fall (principal) 2. Acute blood loss anemia 3. AKI superimposed on CKD stage III 4. Hypothyroidism  5. PAF 6. Essential hypertension 7. Alzheimer's dementia  Discharge Condition: improved Disposition: short-term SNF  Diet recommendation: regular  Filed Weights   04/14/18 1413  Weight: 66.5 kg    History of present illness:  89yow sustained fall, presented with pain, admitted for right femoral neck  fracture.  Hospital Course:  Patient underwent right hip hemiarthroplasty 54/65 with minor complications of acute blood loss anemia and AKI both stabilized.  Hospitalization was otherwise uncomplicated.  Comorbidities remained stable.  Physical therapy is recommended skilled nursing facility.  Individual issues as below.  Right femoral neck fracture secondary to fall, likely mechanical. S/p surgery 10/30 --Appears stable --Per orthopedics  Weightbearing: WBAT, posterior hip precautions                  Insicional and dressing care: Keep dry until postoperative day 2                  Orthopedic device(s):Hip abduction pillow while in bed DVT prophylaxis with Lovenox, follow-up in 2 weeks with Dr. Doreatha Martin for incision check and x-rays  Acute blood loss anemia secondary to surgery. --Hemoglobin has stabilized.  Has not required any blood products.  AKI superimposed on CKD stage III.  Renal ultrasound unremarkable.  Urinalysis unremarkable. --Nearly resolved.  Multifactorial, surgical and postoperative hypotension.    Adequate urine output Renal ultrasound unremarkable.  Secondary to n.p.o. status, surgery, probably complicated by postoperative hypotension.     Hypothyroidism  --Continue levothyroxine   PAF. CHA2DS2-VASc 5. Not on anticoagulation, poor candidate per Dr. Jackalyn Lombard note 07/07/2017  Essential hypertension.  Norvasc added.  Alzheimer's dementia --Stable.  Consultants:  Orthopedics   Procedures:  CPT 27125-Right hip hemiarthroplasty  Assessment 11/3: see progress note 11/5  for 11/5 assessment S: Minimal pain.  Eating okay.  No complaints. O: Vitals:  Vitals:   04/19/18 0924 04/19/18 0927  BP:  (!) 150/83  Pulse:  71  Resp:  18  Temp: 97.9 F (36.6 C)   SpO2:  98%   Constitutional:    Appears calm and comfortable Respiratory:   CTA bilaterally, no w/r/r.   Respiratory effort normal.  Cardiovascular:   RRR, no m/r/g  No LLE extremity edema     Mild right thigh edema without apparent change Musculoskeletal:   RLE, LLE    Moves both feet to command Psychiatric:   Mental status ? Mood, affect appropriate  I have personally reviewed the following:   Data:  Urine output 1700  BUN has normalized to 23, creatinine trending down 1.30  Hemoglobin increased, 8.5.  Platelets have recovered, 150.  Discharge Instructions   Allergies as of 04/19/2018      Reactions   Exelon [rivastigmine Tartrate] Other (See Comments)   Asystole/sinus pauses/syncope   Aricept [donepezil Hcl] Nausea And Vomiting, Other (See Comments)   Irregular heartbeat also   Mobic [meloxicam] Other (See Comments)   Abdominal pain, GI ulcer (?)   Namenda [memantine Hcl] Nausea And Vomiting, Other (See Comments)   Irregular heartbeat also   Tetracyclines & Related Swelling   Lips swell   Vancomycin Rash   Tape Other (See Comments)   TAPE BRUISES THE SKIN (IT IS VERY THIN)   Ancef [cefazolin] Rash   Diffuse rash with urticaria, no respiratory involvement   Sulfa Drugs Cross Reactors Other (See Comments)   Reaction not recalled by the patient      Medication List    STOP taking these medications   GINKGO BILOBA PO     TAKE these medications   amLODipine 5 MG tablet Commonly known as:  NORVASC Take 1 tablet (5 mg total) by mouth daily.   enoxaparin 40 MG/0.4ML injection Commonly known as:  LOVENOX Inject 0.4 mLs (40 mg total) into the skin daily. 30 days total for DVT prophylaxis s/p hip replacement. Last dose 11/30.   HYDROcodone-acetaminophen 5-325 MG tablet Commonly known as:  NORCO/VICODIN Take 1-2 tablets by mouth every 6 (six) hours as needed for moderate pain or severe pain (1 tab for moderate pain, 2 for severe).   levothyroxine 137 MCG tablet Commonly known as:  SYNTHROID, LEVOTHROID Take 137 mcg by mouth daily before breakfast.   OVER THE COUNTER MEDICATION Take 1 tablet by mouth daily. Vitamins D3   sertraline 25 MG  tablet Commonly known as:  ZOLOFT Take 1 tablet (25 mg total) by mouth daily.   VITAMIN B-12 PO Take 1 tablet by mouth daily.      Allergies  Allergen Reactions  . Exelon [Rivastigmine Tartrate] Other (See Comments)    Asystole/sinus pauses/syncope  . Aricept [Donepezil Hcl] Nausea And Vomiting and Other (See Comments)    Irregular heartbeat also  . Mobic [Meloxicam] Other (See Comments)    Abdominal pain, GI ulcer (?)   . Namenda [Memantine Hcl] Nausea And Vomiting and Other (See Comments)    Irregular heartbeat also  . Tetracyclines & Related Swelling    Lips swell   . Vancomycin Rash  . Tape Other (See Comments)    TAPE BRUISES THE SKIN (IT IS VERY THIN)  . Ancef [Cefazolin] Rash    Diffuse rash with urticaria, no respiratory involvement  . Sulfa Drugs Cross Reactors Other (See Comments)    Reaction not recalled by the  patient    The results of significant diagnostics from this hospitalization (including imaging, microbiology, ancillary and laboratory) are listed below for reference.    Significant Diagnostic Studies: Dg Pelvis 1-2 Views  Result Date: 04/14/2018 CLINICAL DATA:  Closed right hip fracture. EXAM: PELVIS - 1-2 VIEW COMPARISON:  04/12/2018 FINDINGS: Interval placement of right hip arthroplasty intact and normally located. Left hip arthroplasty unchanged. Remainder of the exam is unchanged. IMPRESSION: Interval placement of right hip arthroplasty intact and normally located. Electronically Signed   By: Marin Olp M.D.   On: 04/14/2018 01:01   US Renal  Result Date: 04/15/2018 CLINICAL DATA:  Acute kidney injury EXAM: RENAL / URINARY TRACT ULTRASOUND COMPLETE COMPARISON:  None. FINDINGS: Right Kidney: Renal measurements: 9.7 x 4.4 x 5 cm = volume: 111.1 mL . Echogenicity within normal limits. No mass or hydronephrosis visualized. Left Kidney: Renal measurements: 10 x 4.9 x 4.6 cm = volume: 117.8 mL. Echogenicity within normal limits. No mass or hydronephrosis  visualized. Bladder: Appears normal for degree of bladder distention. IMPRESSION: Normal renal ultrasound. Electronically Signed   By: Kathreen Devoid   On: 04/15/2018 10:17   Dg Chest Port 1 View  Result Date: 04/12/2018 CLINICAL DATA:  Preop. Hip fracture. Former smoker. EXAM: PORTABLE CHEST 1 VIEW COMPARISON:  02/06/2018 FINDINGS: The cardiomediastinal silhouette is unchanged with normal heart size. Lung volumes are lower than on the prior study, and there is minimal opacity in the left lung base. No pleural effusion or pneumothorax is identified. Moderate lower thoracic levoscoliosis is noted. IMPRESSION: Decreased lung volumes with minimal left basilar opacity, likely atelectasis. Electronically Signed   By: Logan Bores M.D.   On: 04/12/2018 20:31   Dg Hip Unilat  With Pelvis 2-3 Views Right  Result Date: 04/12/2018 CLINICAL DATA:  Status post fall EXAM: DG HIP (WITH OR WITHOUT PELVIS) 2-3V RIGHT COMPARISON:  None. FINDINGS: There is displaced fracture of the right femoral neck. Left hip replacement is identified without malalignment. IMPRESSION: Fracture of the right femoral neck. Electronically Signed   By: Abelardo Diesel M.D.   On: 04/12/2018 18:24    Microbiology: Recent Results (from the past 240 hour(s))  MRSA PCR Screening     Status: None   Collection Time: 04/13/18 12:48 AM  Result Value Ref Range Status   MRSA by PCR NEGATIVE NEGATIVE Final    Comment:        The GeneXpert MRSA Assay (FDA approved for NASAL specimens only), is one component of a comprehensive MRSA colonization surveillance program. It is not intended to diagnose MRSA infection nor to guide or monitor treatment for MRSA infections. Performed at [facility], [place] [address] [place], [place] [address]      Labs: Basic Metabolic Panel: Recent Labs  Lab 04/14/18 0810 04/14/18 2243 04/15/18 1418 04/16/18 0317 04/17/18 0552  NA 133* 135 136 136 136  K 4.0 3.8 3.9 3.6 3.5  CL 103 107 113* 115*  114*  CO2 22 22 19* 18* 17*  GLUCOSE 109* 107* 125* 104* 88  BUN 25* 35* 34* 30* 23  CREATININE 1.49* 2.10* 1.82* 1.52* 1.30*  CALCIUM 8.5* 8.0* 7.9* 7.9* 7.9*   Liver Function Tests: Recent Labs  Lab 04/12/18 1744  AST 18  ALT 11  ALKPHOS 55  BILITOT 0.8  PROT 6.9  ALBUMIN 4.1   CBC: Recent Labs  Lab 04/12/18 1744 04/14/18 0810 04/14/18 2243 04/15/18 1418 04/16/18 1024 04/17/18 0552  WBC 11.1* 10.0 8.0 7.5 6.8 7.2  NEUTROABS 8.8*  --   --   --   --   --   HGB 12.4 9.2* 8.3* 8.5* 7.9* 8.5*  HCT 37.1 28.9* 25.8* 26.2* 25.0* 27.6*  MCV 92.5 92.3 93.8 95.6 94.3 94.2  PLT 189 159 115* 100* 114* 150   Cardiac Enzymes: Recent Labs  Lab 04/12/18 2013  TROPONINI <0.03    Principal Problem:   Fracture of femoral neck, right (HCC) Active Problems:   Hypothyroid   CKD (chronic kidney disease) stage 3, GFR 30-59 ml/min (HCC)   Atrial fibrillation (HCC)   Pressure injury of skin   AKI (acute kidney injury) ([place])   Acute blood loss anemia   Time coordinating discharge: 35 minutes  Signed:  Murray Hodgkins, MD [facility] 04/19/2018, 12:24 PM

## 2018-04-17 NOTE — Progress Notes (Signed)
Per Social work, patient is not ready to discharge to SNF today. MD notified via text page. F/C discontinued, patient is due to void.

## 2018-04-17 NOTE — Progress Notes (Signed)
MD notified of patients elevated B/P. New order received. Bladder scan s/p f/c removal revealed >360cc of urine. Order to I&O cath q 6hrs PRN. Patient assisted to bedside commode, voided 100cc, I&O cath performed 350cc of clear yellow urine noted. Patient tolerated procedure. Cont to monitor.

## 2018-04-18 MED ORDER — AMLODIPINE BESYLATE 5 MG PO TABS: 5.0000 mg | ORAL_TABLET | Freq: Every day | ORAL | Status: AC

## 2018-04-18 NOTE — Clinical Social Work Note (Addendum)
Faxed clinicals to [facility] for insurance authorization review. They will likely request updated PT and OT notes since the last ones are over 48 hours old. Patient has a bed at [facility] once authorization is approved. Left voicemail for son.  Dayton Scrape, [place] [phone]  12:21 pm Faxed today's PT note to [facility].  Dayton Scrape, [place] [phone]  2:17 pm Insurance authorization still pending. It has not been assigned to anyone yet.  Dayton Scrape, [place] [phone]  4:33 pm Insurance authorization still has not been assigned to anyone. Patient's son aware.  Dayton Scrape, [place]

## 2018-04-18 NOTE — Progress Notes (Signed)
Ortho trauma progress note:  Nauseous, pain in right hip. Confused.  RLE: Incision clean, dry and intact. Swelling appropriate. Neurovascularly intact  A/P 84 yo female s/p right hip hemiarthroplasty  WBAT Posterior hip precautions for 6 weeks Return in 2 weeks for wound check and x-rays  Shona Needles, MD [facility] [phone] (phone)

## 2018-04-18 NOTE — Plan of Care (Signed)
  Problem: Education: Goal: Knowledge of General Education information will improve Description Including pain rating scale, medication(s)/side effects and non-pharmacologic comfort measures Outcome: Progressing   Problem: Health Behavior/Discharge Planning: Goal: Ability to manage health-related needs will improve Outcome: Progressing   

## 2018-04-18 NOTE — Progress Notes (Addendum)
  PROGRESS NOTE  Morgan Weaver QQI:[id number] DOB: [birth year] DOA: 04/12/2018 PCP: Leonard Downing, MD  Brief Narrative: 89yow sustained fall, presented with pain, admitted for right femoral neck fracture.  Assessment/Plan Right femoral neck fracture secondary to fall, likely mechanical. S/p surgery 10/30 --Remained stable.  Instructions as below. --Per orthopedics  Weightbearing:WBAT, posterior hip precautions Insicional and dressing care: Keep dry until postoperative day 2 Orthopedic device(s):Hip abduction pillow while in bed DVT prophylaxis with Lovenox, follow-up in 2-3 weeks with Dr. Doreatha Martin for incision check and x-rays  Acute blood loss anemiasecondary to surgery. --Hemoglobin stabilized.  Did not require blood products.    AKI superimposed on CKD stage III. Renal ultrasound unremarkable. Urinalysis unremarkable. --Expect spontaneous resolution. Multifactorial, surgical and postoperative hypotension.   Adequate urine outputRenal ultrasound unremarkable.  Secondary to n.p.o. status, surgery, probably complicated by postoperative hypotension.   Hypothyroidism  --Continue levothyroxine   PAF. CHA2DS2-VASc5. Not on anticoagulation, poor candidate per Dr. Jackalyn Lombard note 07/07/2017  Essential hypertension.  Norvasc added.  Alzheimer's dementia --Stable.   Remains stable for transfer to skilled nursing facility for rehab.  Awaiting insurance authorization.  DVT prophylaxis: enoxaparin Code Status: Full Family Communication: son Pearline Cables by telephone. Disposition Plan: SNF    Murray Hodgkins, MD  [facility] Direct contact: [phone] --Via amion app OR  --[email or web address]; password [email or web address]  7PM-7AM contact night coverage as above 04/18/2018, 10:52 AM  LOS: 6 days   Interval history/Subjective: Feels okay today.  Minimal pain.  Objective: Vitals:  Vitals:   04/18/18 0613 04/18/18 0806  BP: (!) 156/73 (!) 158/72   Pulse:  82  Resp:  18  Temp:    SpO2:  98%    Exam:  Constitutional:  . Appears calm and comfortable Respiratory:  . CTA bilaterally, no w/r/r.  . Respiratory effort normal.  Cardiovascular:  . RRR, no m/r/g . No LLE extremity edema.  Right thigh edema unchanged. Musculoskeletal:  . RLE, LLE   . Moves both feet to command. Psychiatric:  . Mental status o Mood, affect appropriate  I have personally reviewed the following:   Data: . No new data  Scheduled Meds: . amLODipine  5 mg Oral Daily  . enoxaparin (LOVENOX) injection  40 mg Subcutaneous Q24H  . levothyroxine  137 mcg Oral Q0600  . sertraline  25 mg Oral Daily   Continuous Infusions: . lactated ringers 10 mL/hr at 04/13/18 1308    Principal Problem:   Fracture of femoral neck, right (HCC) Active Problems:   Hypothyroid   CKD (chronic kidney disease) stage 3, GFR 30-59 ml/min (HCC)   Atrial fibrillation (HCC)   Pressure injury of skin   AKI (acute kidney injury) ([place])   Acute blood loss anemia   LOS: 6 days

## 2018-04-18 NOTE — Progress Notes (Signed)
Physical Therapy Treatment Patient Details Name: Morgan Weaver MRN: [id number] DOB: [birth year] Today's Date: 04/18/2018    History of Present Illness 84 yo admitted after fall with right femoral neck fx s/p Rt hip hemiarthoplasty 10/30 . PMHx: HTN, HLD, hypothyroidism, A. fib, CKD 3, Alzheimers    PT Comments    Patient seen for mobility progression. Pt continues to be oriented to self only but much more participatory this session. Pt requires +2 for functional transfer and gait training. Pt able to transfer several times during session and ambulated short distance bed to recliner. Continue to progress as tolerated with anticipated d/c to SNF for further skilled PT services.     Follow Up Recommendations  SNF;Supervision/Assistance - 24 hour     Equipment Recommendations  3in1 (PT)    Recommendations for Other Services       Precautions / Restrictions Precautions Precautions: Posterior Hip;Fall Precaution Booklet Issued: Yes (comment) Precaution Comments: posted in room, Pt with no recall Required Braces or Orthoses: Other Brace/Splint Other Brace/Splint: hip abduction pillow at rest Restrictions Weight Bearing Restrictions: Yes RLE Weight Bearing: Weight bearing as tolerated    Mobility  Bed Mobility Overal bed mobility: Needs Assistance Bed Mobility: Supine to Sit     Supine to sit: +2 for physical assistance;+2 for safety/equipment;HOB elevated;Mod assist     General bed mobility comments: HOB elevated; cues for sequencing and for use of rail; assist needed to bring R LE/hips to EOB and to elevate trunk into sitting; use of bed pad to scoot  Transfers Overall transfer level: Needs assistance Equipment used: Rolling walker (2 wheeled) Transfers: Sit to/from [facility] Sit to Stand: Mod assist;Max assist;+2 physical assistance Stand pivot transfers: Mod assist;Max assist;+2 physical assistance       General transfer comment: cues for safe hand  placement/use of AD, posture, and sequencing; pt tends to attempt sitting prematurely; assist for balance, weight shifting, and guiding RW; heavy reliance on bilat UE support   Ambulation/Gait Ambulation/Gait assistance: Mod assist;+2 physical assistance Gait Distance (Feet): 3 Feet Assistive device: Rolling walker (2 wheeled) Gait Pattern/deviations: Step-to pattern;Decreased stance time - right;Decreased step length - left;Decreased step length - right;Decreased dorsiflexion - left;Decreased dorsiflexion - right;Decreased weight shift to right;Trunk flexed     General Gait Details: multimodal cues for posture and vc for sequencing; assist for balance, weight shifting, and guiding RW   Stairs             Wheelchair Mobility    Modified Rankin (Stroke Patients Only)       Balance Overall balance assessment: History of Falls Sitting-balance support: Bilateral upper extremity supported;Feet supported Sitting balance-Leahy Scale: Poor     Standing balance support: Bilateral upper extremity supported;During functional activity Standing balance-Leahy Scale: Poor                              Cognition Arousal/Alertness: Awake/alert Behavior During Therapy: Agitated(fluctuated throughout session) Overall Cognitive Status: History of cognitive impairments - at baseline                                 General Comments: Pt oriented to self only and adiment that she did not fall and/or have surgery; attempted to orient pt throughout session as she continues to ask why her R LE hurts       Exercises  General Comments        Pertinent Vitals/Pain Pain Assessment: Faces Faces Pain Scale: Hurts even more Pain Location: R hip with mobility Pain Descriptors / Indicators: Guarding;Grimacing;Sore Pain Intervention(s): Limited activity within patient's tolerance;Monitored during session;Repositioned    Home Living                       Prior Function            PT Goals (current goals can now be found in the care plan section) Progress towards PT goals: Progressing toward goals    Frequency    Min 3X/week      PT Plan Current plan remains appropriate    Co-evaluation              AM-PAC PT "6 Clicks" Daily Activity  Outcome Measure  Difficulty turning over in bed (including adjusting bedclothes, sheets and blankets)?: Unable Difficulty moving from lying on back to sitting on the side of the bed? : Unable Difficulty sitting down on and standing up from a chair with arms (e.g., wheelchair, bedside commode, etc,.)?: Unable Help needed moving to and from a bed to chair (including a wheelchair)?: A Lot Help needed walking in hospital room?: A Lot Help needed climbing 3-5 steps with a railing? : Total 6 Click Score: 8    End of Session Equipment Utilized During Treatment: Gait belt Activity Tolerance: Patient limited by pain Patient left: with call bell/phone within reach;in chair;with chair alarm set Nurse Communication: Mobility status PT Visit Diagnosis: Other abnormalities of gait and mobility (R26.89);Muscle weakness (generalized) (M62.81);Pain;History of falling (Z91.81) Pain - Right/Left: Right Pain - part of body: Hip     Time: 4268-3419 PT Time Calculation (min) (ACUTE ONLY): 27 min  Charges:  $Gait Training: 8-22 mins $Therapeutic Activity: 8-22 mins                     Earney Navy, PTA Acute Rehabilitation Services Pager: [phone] Office: [phone]     Darliss Cheney 04/18/2018, 12:08 PM

## 2018-04-18 NOTE — Progress Notes (Signed)
Called to pt room for report nausea. Pt up in chair and verified complaint. Declines medication or clear sodas. Says she just wants to stay still "a while" and wait for nausea to pass. Encouraged to call if nausea continues.

## 2018-04-19 MED ORDER — ENSURE ENLIVE PO LIQD
237.0000 mL | Freq: Two times a day (BID) | ORAL | Status: DC
  Administered 2018-04-19: 237 mL via ORAL

## 2018-04-19 NOTE — Progress Notes (Signed)
Patient discharged to [facility]. Report called in to [place]. Patient medicated for elevated B/P prior to D/C. Left unit via PTAR.

## 2018-04-19 NOTE — Plan of Care (Signed)
  Problem: Education: Goal: Knowledge of General Education information will improve Description Including pain rating scale, medication(s)/side effects and non-pharmacologic comfort measures Outcome: Progressing   Problem: Activity: Goal: Risk for activity intolerance will decrease Outcome: Progressing   Problem: Pain Managment: Goal: General experience of comfort will improve Outcome: Progressing   Problem: Safety: Goal: Ability to remain free from injury will improve Outcome: Progressing   Problem: Self-Concept: Goal: Ability to maintain and perform role responsibilities to the fullest extent possible will improve Outcome: Progressing

## 2018-04-19 NOTE — Clinical Social Work Note (Signed)
Insurance authorization approved: [id number]. SNF aware. Left voicemail for son.  Morgan Weaver, [name]

## 2018-04-19 NOTE — Progress Notes (Signed)
  PROGRESS NOTE  Morgan Weaver CZY:[id number] DOB: February 09, 1929 DOA: 04/12/2018 PCP: Leonard Downing, MD  Brief Narrative: 89yow sustained fall, presented with pain, admitted for right femoral neck fracture.  Assessment/Plan Right femoral neck fracture secondary to fall, likely mechanical.  Remains stable s/p surgery 10/30 --Continue Per orthopedics  Weightbearing:WBAT, posterior hip precautions Insicional and dressing care: Keep dry until postoperative day 2 Orthopedic device(s):Hip abduction pillow while in bed DVT prophylaxis with Lovenox, follow-up in 2-3 weeks with Dr. Doreatha Martin for incision check and x-rays  Acute blood loss anemiasecondary to surgery. --Hemoglobin stabilized.  Did not require blood products.    AKI superimposed on CKD stage III. Renal ultrasound unremarkable. Urinalysis unremarkable. --Spontaneous resolution expected. Multifactorial, surgical and postoperative hypotension.   Adequate urine outputRenal ultrasound unremarkable.  Secondary to n.p.o. status, surgery, probably complicated by postoperative hypotension.   Hypothyroidism  --Continue levothyroxine   PAF. CHA2DS2-VASc5. Not on anticoagulation, poor candidate per Dr. Jackalyn Lombard note 07/07/2017  Essential hypertension.  Norvasc added.  Alzheimer's dementia --Stable.   Remains stable for transfer to skilled nursing facility.  Awaiting authorization from insurance company.  DVT prophylaxis: enoxaparin Code Status: Full Family Communication: none Disposition Plan: SNF    Murray Hodgkins, MD  [facility] Direct contact: [phone] --Via amion app OR  --[email or web address]; password [email or web address]  7PM-7AM contact night coverage as above 04/19/2018, 12:21 PM  LOS: 7 days   Interval history/Subjective: Feels all right.  No significant pain.  RN reports appetite is poor.  Objective: Vitals:  Vitals:   04/19/18 0924 04/19/18 0927  BP:  (!) 150/83    Pulse:  71  Resp:  18  Temp: 97.9 F (36.6 C)   SpO2:  98%    Exam: Constitutional:   . Appears calm and comfortable Respiratory:  . CTA bilaterally, no w/r/r.  . Respiratory effort normal.  Cardiovascular:  . RRR, no m/r/g . No significant LE extremity edema   Musculoskeletal:  . RUE, LUE, RLE, LLE   . Moves both feet to command Psychiatric:  . Mental status o Mood, affect appropriate  I have personally reviewed the following:   Data: . No new data  Scheduled Meds: . amLODipine  5 mg Oral Daily  . enoxaparin (LOVENOX) injection  40 mg Subcutaneous Q24H  . levothyroxine  137 mcg Oral Q0600  . sertraline  25 mg Oral Daily   Continuous Infusions: . lactated ringers 10 mL/hr at 04/13/18 1308    Principal Problem:   Fracture of femoral neck, right (HCC) Active Problems:   Hypothyroid   CKD (chronic kidney disease) stage 3, GFR 30-59 ml/min (HCC)   Atrial fibrillation (HCC)   Pressure injury of skin   AKI (acute kidney injury) ([place])   Acute blood loss anemia   LOS: 7 days

## 2018-04-19 NOTE — Care Management Important Message (Signed)
Important Message  Patient Details  Name: Morgan Weaver MRN: [id number] Date of Birth: [birth year]   Medicare Important Message Given:  Yes    [name] 04/19/2018, 4:14 PM

## 2018-04-19 NOTE — Progress Notes (Signed)
Patient will DC to: [facility] Anticipated DC date: 04/19/18 Family notified: son Ronney Asters by: Corey Harold  Per MD patient ready for DC to [facility] . RN, patient, patient's family, and facility notified of DC. Discharge Summary sent to facility. RN given number for report [phone] Room 210. DC packet on chart. Ambulance transport requested for patient.  CSW signing off.  [place], [employer]

## 2018-05-03 ENCOUNTER — Telehealth: Payer: Self-pay | Admitting: Neurology

## 2018-05-03 NOTE — Telephone Encounter (Signed)
Pts son(Gray not on recent DPR) called stating the pt has gotten called for jury duty, requesting a letter stating that she is noting physically/mentally able to do so.  Please call to advise

## 2018-05-04 ENCOUNTER — Encounter: Payer: Self-pay | Admitting: *Deleted

## 2018-05-04 NOTE — Telephone Encounter (Signed)
Spoke to [place], son of pt. I relayed that need copy of summons.  He will fax or try to. I gave him fax # [phone].  I already started letter and need to add.  Once done Dr. Jaynee Eagles to sign when she returns to office next week.

## 2018-05-09 NOTE — Telephone Encounter (Signed)
I spoke to Pearline Cables, son.  He has not faxed yet, is on his to do list.

## 2018-05-10 NOTE — Telephone Encounter (Signed)
Received jury summons.  Letter printed and at Dr. Cathren Laine desk for review and signature.

## 2018-05-10 NOTE — Telephone Encounter (Signed)
Spoke to son, Pearline Cables.  Would like mailed.  Mailed letter to [place] [phone].

## 2018-05-10 NOTE — Telephone Encounter (Signed)
Signed. thanks

## 2018-06-03 ENCOUNTER — Telehealth: Payer: Self-pay | Admitting: *Deleted

## 2018-06-03 NOTE — Telephone Encounter (Signed)
We received an FL-2 form and physician's supplement order form from [place] @ [facility]. Pt plans to move in there per form and they have requested completion. RN discussed with [place] that form would be best completed by primary care as there are many areas on the form that are treated/managed by a pcp rather than neurology. She verbalized understanding and will call the pt's family to let them know and find out who pt's PCP is. She will only call back there is an issue. She verbalized appreciation for the call.

## 2018-08-19 ENCOUNTER — Encounter (HOSPITAL_BASED_OUTPATIENT_CLINIC_OR_DEPARTMENT_OTHER): Payer: Medicare Other | Attending: Internal Medicine

## 2018-08-19 DIAGNOSIS — F0390 Unspecified dementia without behavioral disturbance: Secondary | ICD-10-CM | POA: Insufficient documentation

## 2018-08-19 DIAGNOSIS — I2510 Atherosclerotic heart disease of native coronary artery without angina pectoris: Secondary | ICD-10-CM | POA: Diagnosis not present

## 2018-08-19 DIAGNOSIS — G219 Secondary parkinsonism, unspecified: Secondary | ICD-10-CM | POA: Insufficient documentation

## 2018-08-19 DIAGNOSIS — L89613 Pressure ulcer of right heel, stage 3: Secondary | ICD-10-CM | POA: Diagnosis not present

## 2018-08-19 DIAGNOSIS — Z87891 Personal history of nicotine dependence: Secondary | ICD-10-CM | POA: Insufficient documentation

## 2018-08-19 DIAGNOSIS — Z96641 Presence of right artificial hip joint: Secondary | ICD-10-CM | POA: Insufficient documentation

## 2018-08-19 DIAGNOSIS — Z9181 History of falling: Secondary | ICD-10-CM | POA: Diagnosis not present

## 2018-08-19 DIAGNOSIS — I10 Essential (primary) hypertension: Secondary | ICD-10-CM | POA: Insufficient documentation

## 2018-12-23 NOTE — Progress Notes (Deleted)
PATIENT: Morgan Weaver DOB: 1928-07-25  REASON FOR VISIT: follow up HISTORY FROM: patient  No chief complaint on file.    HISTORY OF PRESENT ILLNESS: Today 12/23/18 Morgan Weaver is a 85 y.o. female here today for follow up for dementia.    HISTORY: (copied from Dr Cathren Laine note on 12/31/2017)  12/31/2017: lives up the street from her son Clair Gulling. Drew lives with her, her son. Keeps an eye on her. She has an aid that comes in 5 days a week from 8-2pm. She has had hospitalizations, syncopal episodes, EMS has been called several times. She is cooking over the stove get lightheaded and on the floor. May be dehydration. They are watching her moe. She did not tolerate aricept or namenda, now they help with medications (she double dosed on namenda). She is still having delusions like people are stealing from her. Memory continues to decline. She feeds some Ferral cats, inside cat died. pcp checks levels on B12. She is agitated and won't take showers. Slowly declining.   Interval history 12/23/2015: She lives with her other son. She sometimes dreams things and thinks they are real but she does not wander and no problems at night. MoCA is slightly improved today. She thinks her brother is taking things from her. Explained delusions are common in dementia such as people stealing, son denies it. She says "they are taking plenty of stuff right now". But then again I explained to son that he should monitor to ensure it is not delusions. She says they took all her old photographs but they brought them back, they have gone into her stuff and stolen things like a book that was written on growing fruit trees and other books. She had some outdoor cats and she says she saw Mexicans get her cats  And ate them. Discussed dementia and that delusions, hallucinations can happen in the later stages and this may be due to her dementia. Recommended the 36-hour day. The namzeric made her sick. She is on Namenda. Will try  exelon. If she does not tolerate try patch.  Interval history 02/12/2015: She is a patient of a previous physician in our practice and she is transitioning to me today. Past medical history of dementia. She is here with her son who provides most information. After her last visit she had driving evaluation and the recommendation was that she only drive during daylight hours and only short distances in familiar places. No highway driving.She started taking the Aricept and threw up. She flushed the pills down the toilet. She is here with her son. Most of the time she does well. Son says she does well if he reminds her. She feels she is stable, no significant decline in her memory. She said the driving test was a rip off but she did it and laughs very pleasantly. She still drives, only during the day. No accidents. She lives with her wonderful son she says. No accidents in the home, she hasn't left the stove on for example or had any recent falls. She cooks and clean and shops. She is here with her son that doesn't live with her. No disorientation or confusion as noted in 02/2013 and they credit her improvement to B12 supplementation.She "popped back" very well until she broke her hip shortly after that and was in a nursing home. She performs all her own ADLs. She manages her own medications. She declines to take Aricept anymore even though she has been taking it for  a long time successfully.   Personally reviewed MRI of the brain 03/04/2013: IMPRESSION:  Abnormal MRI brain (without) demonstrating: 1. Moderate cortical and subcortical atrophy. 2. Moderate chronic small vessel ischemic disease. 3. Compared to prior MRI on 05/08/09, there has been some progression of atrophy and chronic small vessel ischemic disease.  Reviewed previous records:  HISTORY OF PRESENT ILLNESS:Ms 55, 85 year old female returns for follow-up for cognitive decline. She was last seen in this office by Dr. Janann Colonel 01/29/2014 who  has left our practice. After her last visit she had driving evaluation and the recommendation was that she only drive during daylight hours and only short distances in familiar places. No highway driving.  She is taking daily oral B12 supplementation. Continues to have difficulty mainly with short term memory but feels there has been no further decline. Taking Aricept 10mg  nightly, she denies side effects to the medication. Her MOCA score has declined since last seen. 03/02/2013 Abnormal MRI brain (without) demonstrating: Moderate cortical and subcortical atrophy. Moderate chronic small vessel ischemic disease. Compared to prior MRI on 05/08/09, there has been some progression of atrophy and chronic small vessel ischemic disease. Explained the results to the son and to the patient. The patient does not remember being called about this nor does the son. She can still cook and perform her ADLs. One of her sons lives with her. She returns for reevaluation.  Initial visit 02/2013: Patient presents today with her son who provides the majority of the history. Per the son she has had some memory trouble for the past few years, they noticed a drastic decline in the past 6-8 weeks. Most notably they noticed trouble with short term memory, increased confusion, disorientation. The son notes that recently she forgot she and owned her own home. Having trouble with basic ADLs. She does fluctuate, centered she has good moments and bad moments. They deny any hallucinations. Reports that long-term memory is still good. Denies any recent strokes TIAs seizures, no head trauma no recent infections or illnesses. Patient has a long history of alcohol use, per the center in several drinks a night. She has not been drinking in the past few weeks though. She was evaluated by her primary care physician and found for B12 of 182. Started on oral B12 for this. Had a head CT in the past few months which showed generalized atrophy but  otherwise unremarkable. Son notes that her physical activity has been decline in the past few months.  REVIEW OF SYSTEMS: Out of a complete 14 system review of symptoms, the patient complains only of the following symptoms, and all other reviewed systems are negative.  ALLERGIES: Allergies  Allergen Reactions   Exelon [Rivastigmine Tartrate] Other (See Comments)    Asystole/sinus pauses/syncope   Aricept [Donepezil Hcl] Nausea And Vomiting and Other (See Comments)    Irregular heartbeat also   Mobic [Meloxicam] Other (See Comments)    Abdominal pain, GI ulcer (?)    Namenda [Memantine Hcl] Nausea And Vomiting and Other (See Comments)    Irregular heartbeat also   Tetracyclines & Related Swelling    Lips swell    Vancomycin Rash   Tape Other (See Comments)    TAPE BRUISES THE SKIN (IT IS VERY THIN)   Ancef [Cefazolin] Rash    Diffuse rash with urticaria, no respiratory involvement   Sulfa Drugs Cross Reactors Other (See Comments)    Reaction not recalled by the patient    HOME MEDICATIONS: Outpatient Medications Prior to Visit  Medication Sig Dispense Refill   amLODipine (NORVASC) 5 MG tablet Take 1 tablet (5 mg total) by mouth daily.     Cyanocobalamin (VITAMIN B-12 PO) Take 1 tablet by mouth daily.     enoxaparin (LOVENOX) 40 MG/0.4ML injection Inject 0.4 mLs (40 mg total) into the skin daily. 30 days total for DVT prophylaxis s/p hip replacement. Last dose 11/30. 0 Syringe    HYDROcodone-acetaminophen (NORCO/VICODIN) 5-325 MG tablet Take 1-2 tablets by mouth every 6 (six) hours as needed for moderate pain or severe pain (1 tab for moderate pain, 2 for severe). 30 tablet 0   levothyroxine (SYNTHROID, LEVOTHROID) 137 MCG tablet Take 137 mcg by mouth daily before breakfast.   0   OVER THE COUNTER MEDICATION Take 1 tablet by mouth daily. Vitamins D3     sertraline (ZOLOFT) 25 MG tablet Take 1 tablet (25 mg total) by mouth daily. 90 tablet 11   No  facility-administered medications prior to visit.     PAST MEDICAL HISTORY: Past Medical History:  Diagnosis Date   Arthritis    Hemorrhoid    Hyperlipidemia    Hypertension    Lyme disease    Ovarian cyst    Syncope    3x   Thyroid disease    hypothyroidism   Torn rotator cuff    Weight decrease     PAST SURGICAL HISTORY: Past Surgical History:  Procedure Laterality Date   ABDOMINAL HYSTERECTOMY  1970   APPENDECTOMY  1952   HIP ARTHROPLASTY Left 03/16/2013   bipolar        Dr Wendee Beavers   HIP ARTHROPLASTY Left 03/17/2013   Procedure: ARTHROPLASTY BIPOLAR HIP;  Surgeon: Augustin Schooling, MD;  Location: [facility];  Service: Orthopedics;  Laterality: Left;   HIP ARTHROPLASTY Right 04/13/2018   Procedure: ARTHROPLASTY BIPOLAR HIP (HEMIARTHROPLASTY);  Surgeon: Shona Needles, MD;  Location: [facility];  Service: Orthopedics;  Laterality: Right;   KNEE ARTHROSCOPY     OOPHORECTOMY  1955   removed due to the ovary having gangrene   OTHER SURGICAL HISTORY     left foot surgery   OTHER SURGICAL HISTORY     right foot surgery   ROTATOR CUFF REPAIR  1989   left   TONSILLECTOMY      FAMILY HISTORY: Family History  Problem Relation Age of Onset   Heart disease Father        heart attack   Hypertension Father    Other Son        brain tumor     SOCIAL HISTORY: Social History   Socioeconomic History   Marital status: Married    Spouse name: Not on file   Number of children: 3   Years of education: 12   Highest education level: Not on file  Occupational History   Occupation: Retired   Scientist, product/process development strain: Not on file   Food insecurity    Worry: Not on file    Inability: Not on file   Transportation needs    Medical: Not on file    Non-medical: Not on file  Tobacco Use   Smoking status: Former Smoker   Smokeless tobacco: Never Used   Tobacco comment: quit-1977  Substance and Sexual Activity   Alcohol use: Not  Currently    Alcohol/week: 14.0 standard drinks    Types: 14 Glasses of wine per week    Comment: BEFORE DINNER   Drug use: No   Sexual activity: Never  Lifestyle   Physical activity    Days per week: Not on file    Minutes per session: Not on file   Stress: Not on file  Relationships   Social connections    Talks on phone: Not on file    Gets together: Not on file    Attends religious service: Not on file    Active member of club or organization: Not on file    Attends meetings of clubs or organizations: Not on file    Relationship status: Not on file   Intimate partner violence    Fear of current or ex partner: Not on file    Emotionally abused: Not on file    Physically abused: Not on file    Forced sexual activity: Not on file  Other Topics Concern   Not on file  Social History Narrative   3 sons Teodora Medici, Lulu Riding is health care decision maker contact [phone] (cell) [phone] (home)   She lives with son Mitzi Hansen    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 There is no height or weight on file to calculate BMI.  Generalized: Well developed, in no acute distress  Cardiology: normal rate and rhythm, no murmur noted Neurological examination  Mentation: Alert oriented to time, place, history taking. Follows all commands speech and language fluent Cranial nerve II-XII: Pupils were equal round reactive to light. Extraocular movements were full, visual field were full on confrontational test. Facial sensation and strength were normal. Uvula tongue midline. Head turning and shoulder shrug  were normal and symmetric. Motor: The motor testing reveals 5 over 5 strength of all 4 extremities. Good symmetric motor tone is noted throughout.  Sensory: Sensory testing is intact to soft touch on all 4 extremities. No evidence  of extinction is noted.  Coordination: Cerebellar testing reveals good finger-nose-finger and heel-to-shin bilaterally.  Gait and station: Gait is normal. Tandem gait is normal. Romberg is negative. No drift is seen.  Reflexes: Deep tendon reflexes are symmetric and normal bilaterally.   DIAGNOSTIC DATA (LABS, IMAGING, TESTING) - I reviewed patient records, labs, notes, testing and imaging myself where available.  No flowsheet data found.   Lab Results  Component Value Date   WBC 7.2 04/17/2018   HGB 8.5 (L) 04/17/2018   HCT 27.6 (L) 04/17/2018   MCV 94.2 04/17/2018   PLT 150 04/17/2018      Component Value Date/Time   NA 136 04/17/2018 0552   K 3.5 04/17/2018 0552   CL 114 (H) 04/17/2018 0552   CO2 17 (L) 04/17/2018 0552   GLUCOSE 88 04/17/2018 0552   BUN 23 04/17/2018 0552   CREATININE 1.30 (H) 04/17/2018 0552   CALCIUM 7.9 (L) 04/17/2018 0552   PROT 6.9 04/12/2018 1744   ALBUMIN 4.1 04/12/2018 1744   AST 18 04/12/2018 1744   ALT 11 04/12/2018 1744   ALKPHOS 55 04/12/2018 1744   BILITOT 0.8 04/12/2018 1744   GFRNONAA 36 (L) 04/17/2018 0552   GFRAA 42 (L) 04/17/2018 0552   No results found for: CHOL, HDL, LDLCALC, LDLDIRECT, TRIG, CHOLHDL No results found for: HGBA1C Lab Results  Component Value Date   VITAMINB12 509 03/03/2017   Lab Results  Component Value Date   TSH  4.347 08/04/2017       ASSESSMENT AND PLAN 85 y.o. year old female  has a past medical history of Arthritis, Hemorrhoid, Hyperlipidemia, Hypertension, Lyme disease, Ovarian cyst, Syncope, Thyroid disease, Torn rotator cuff, and Weight decrease. here with ***  No diagnosis found.     No orders of the defined types were placed in this encounter.    No orders of the defined types were placed in this encounter.     I spent 15 minutes with the patient. 50% of this time was spent counseling and educating patient on plan of care and medications.    Debbora Presto, FNP-C 12/23/2018, 2:28  PM [facility] [address], [place],  [address] [phone]

## 2019-01-02 ENCOUNTER — Ambulatory Visit: Payer: Medicare Other | Admitting: Nurse Practitioner

## 2019-01-02 ENCOUNTER — Ambulatory Visit: Payer: Medicare Other | Admitting: Family Medicine

## 2019-05-03 IMAGING — US US RENAL
1 series · 14 of 25 positions shown · non-contrast
Comparison: None.

CLINICAL DATA: Acute kidney injury

EXAM:
RENAL / URINARY TRACT ULTRASOUND COMPLETE

[Series 1: us renal · 0.23mm/px · 14 of 53 slices shown]
[im 1/53]
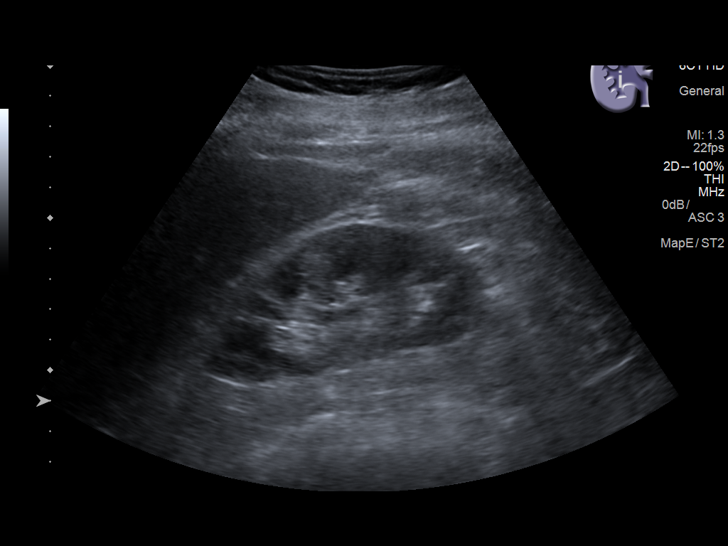
[im 5/53]
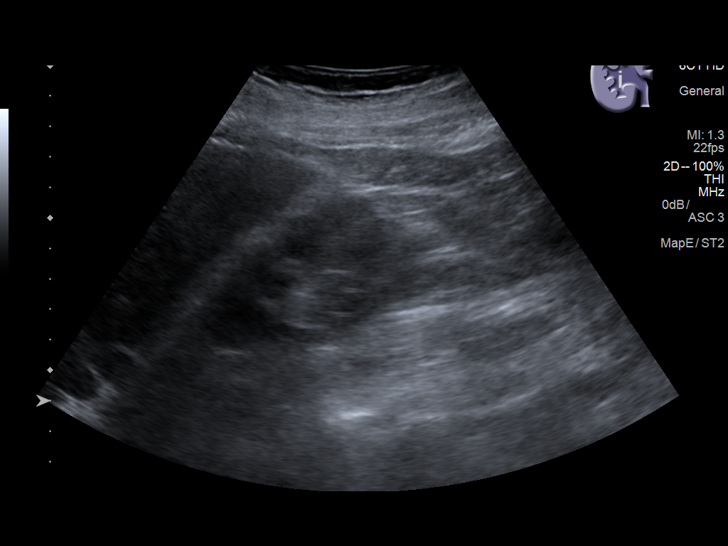
[im 9/53]
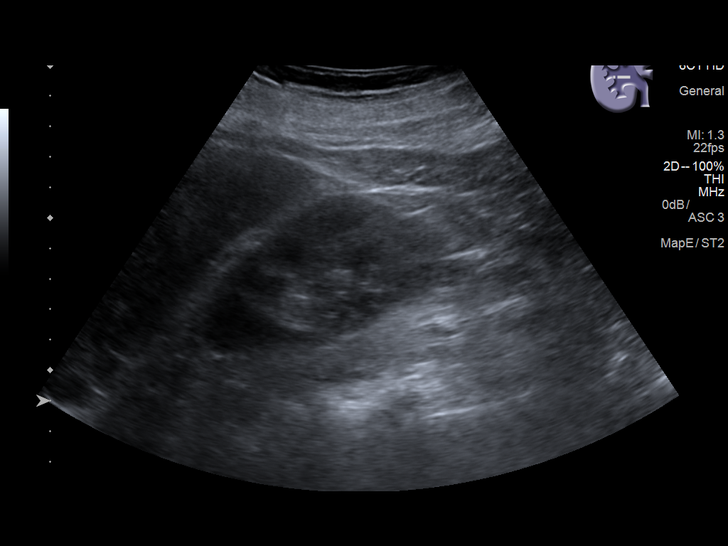
[im 14/53]
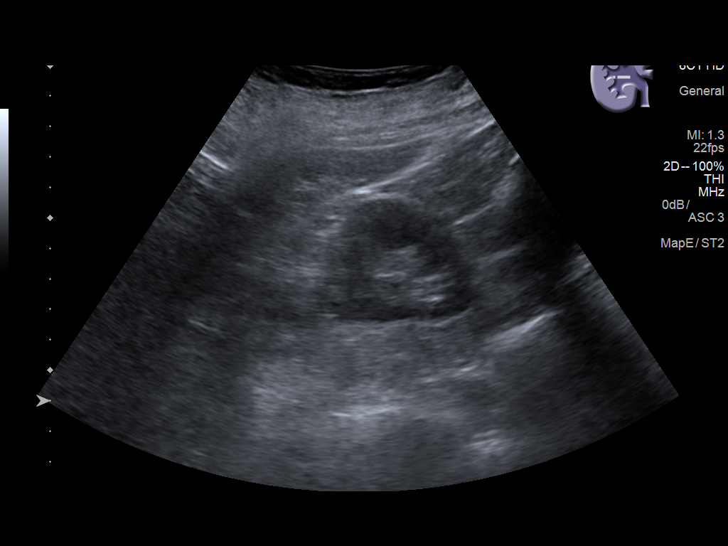
[im 18/53]
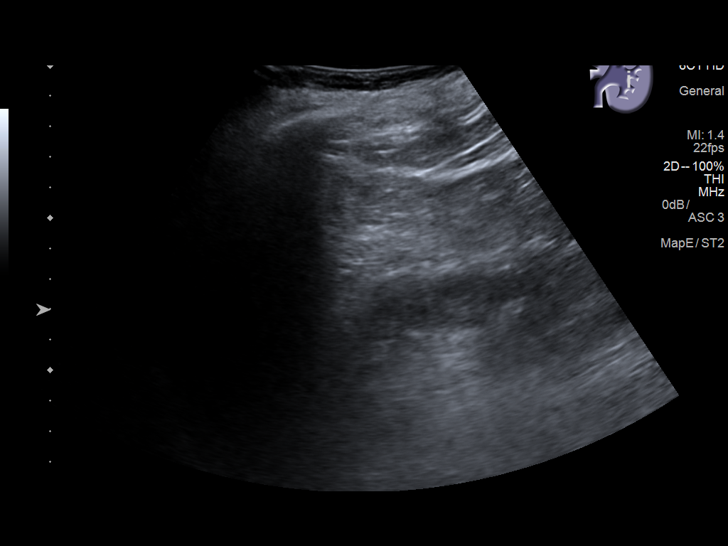
[im 20/53]
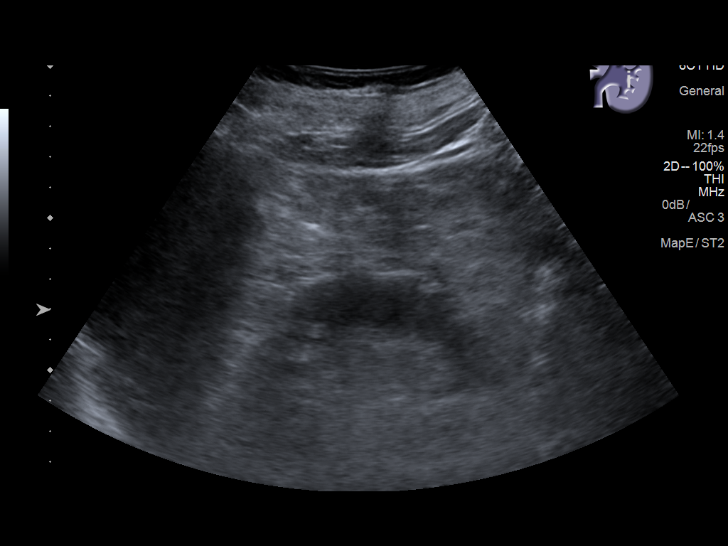
[im 24/53]
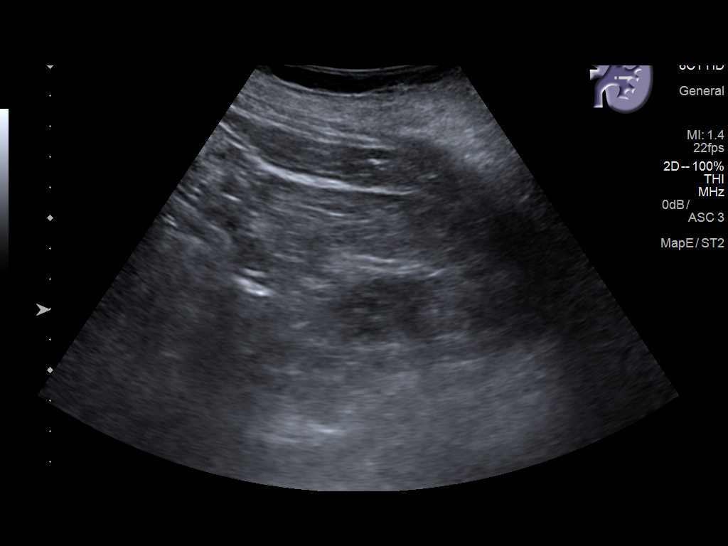
[im 29/53]
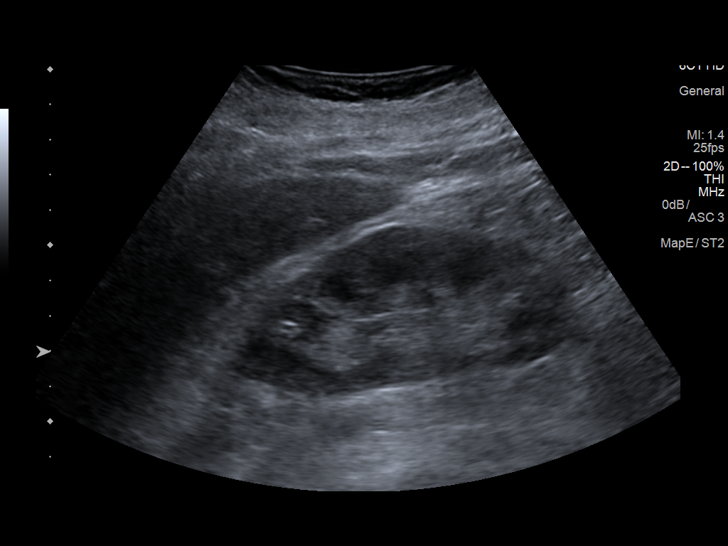
[im 33/53]
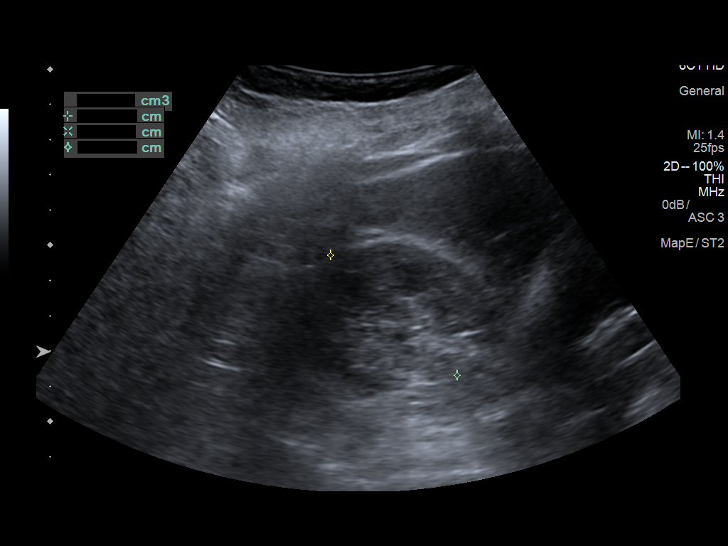
[im 35/53]
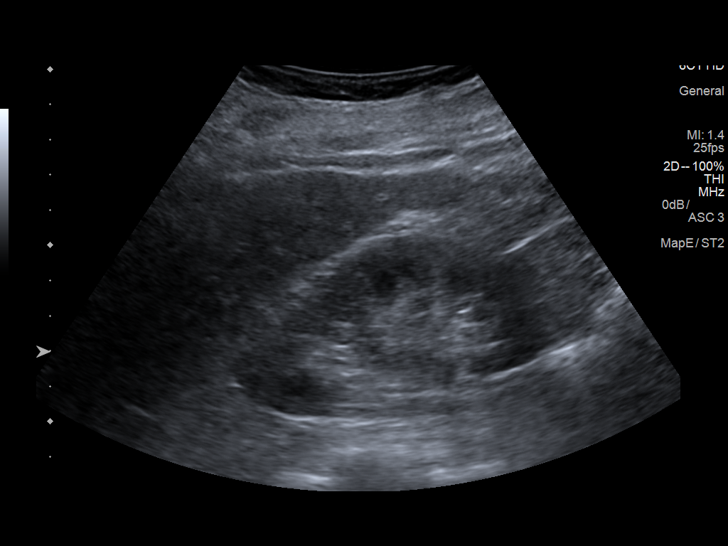
[im 40/53]
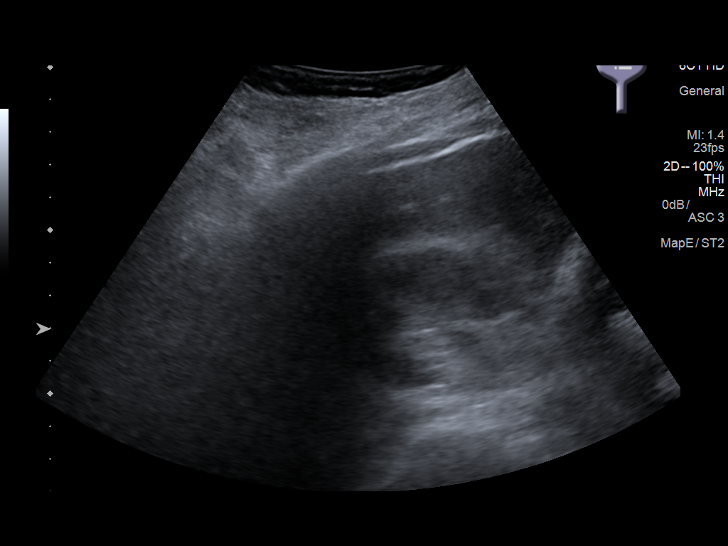
[im 44/53]
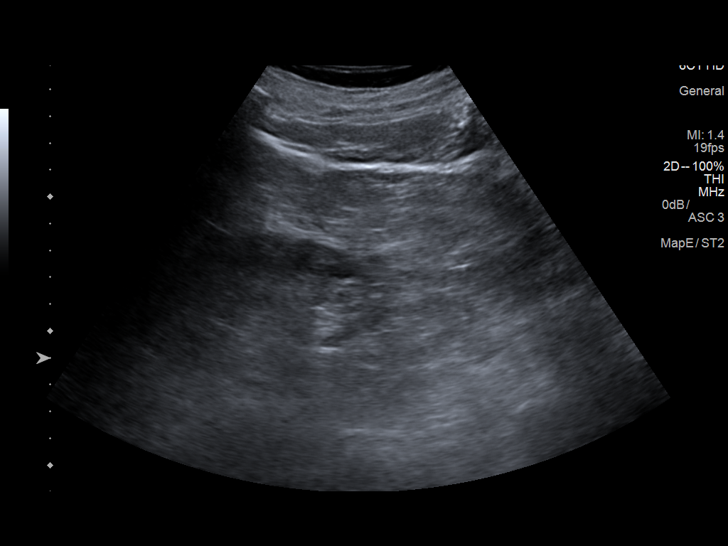
[im 48/53]
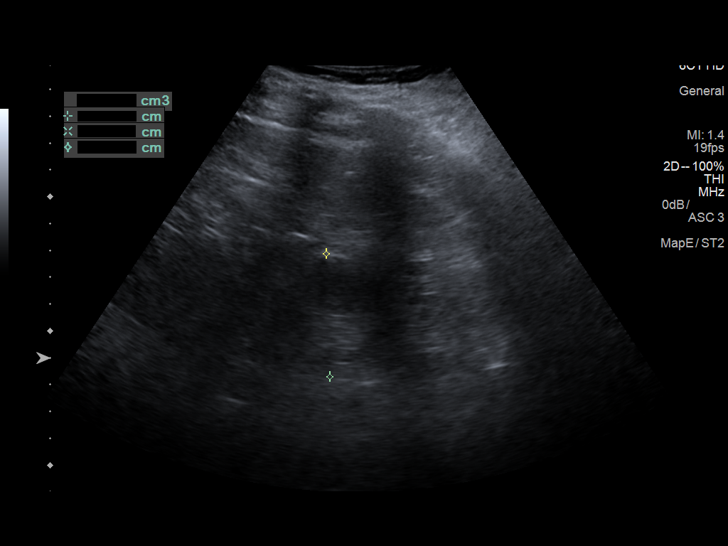
[im 53/53]
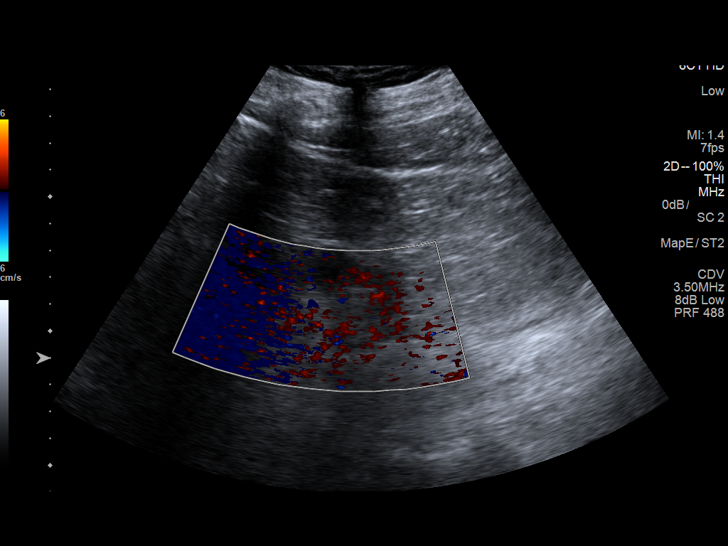

[14 of 25 positions shown; findings below may reference images not displayed]

FINDINGS: Right Kidney:

Renal measurements: 9.7 x 4.4 x 5 cm = volume: 111.1 mL .
Echogenicity within normal limits. No mass or hydronephrosis
visualized.

Left Kidney:

Renal measurements: 10 x 4.9 x 4.6 cm = volume: 117.8 mL.
Echogenicity within normal limits. No mass or hydronephrosis
visualized.

Bladder:

Appears normal for degree of bladder distention.
IMPRESSION: Normal renal ultrasound.

## 2020-02-05 ENCOUNTER — Other Ambulatory Visit: Payer: Self-pay

## 2020-02-05 ENCOUNTER — Emergency Department (HOSPITAL_COMMUNITY): Payer: Medicare Other

## 2020-02-05 ENCOUNTER — Emergency Department (HOSPITAL_COMMUNITY)
Admission: EM | Admit: 2020-02-05 | Discharge: 2020-02-06 | Disposition: A | Discharge status: Home / Self Care | Payer: Medicare Other | Attending: Emergency Medicine | Admitting: Emergency Medicine

## 2020-02-05 DIAGNOSIS — N183 Chronic kidney disease, stage 3 unspecified: Secondary | ICD-10-CM | POA: Insufficient documentation

## 2020-02-05 DIAGNOSIS — S060X0A Concussion without loss of consciousness, initial encounter: Secondary | ICD-10-CM | POA: Diagnosis present

## 2020-02-05 DIAGNOSIS — D709 Neutropenia, unspecified: Secondary | ICD-10-CM | POA: Diagnosis not present

## 2020-02-05 DIAGNOSIS — Y9389 Activity, other specified: Secondary | ICD-10-CM | POA: Diagnosis not present

## 2020-02-05 DIAGNOSIS — R55 Syncope and collapse: Secondary | ICD-10-CM | POA: Diagnosis not present

## 2020-02-05 DIAGNOSIS — D729 Disorder of white blood cells, unspecified: Secondary | ICD-10-CM

## 2020-02-05 DIAGNOSIS — Y999 Unspecified external cause status: Secondary | ICD-10-CM | POA: Insufficient documentation

## 2020-02-05 DIAGNOSIS — D72828 Other elevated white blood cell count: Secondary | ICD-10-CM

## 2020-02-05 DIAGNOSIS — D649 Anemia, unspecified: Secondary | ICD-10-CM | POA: Insufficient documentation

## 2020-02-05 DIAGNOSIS — R410 Disorientation, unspecified: Secondary | ICD-10-CM | POA: Diagnosis not present

## 2020-02-05 DIAGNOSIS — E039 Hypothyroidism, unspecified: Secondary | ICD-10-CM | POA: Diagnosis not present

## 2020-02-05 DIAGNOSIS — F0390 Unspecified dementia without behavioral disturbance: Secondary | ICD-10-CM | POA: Insufficient documentation

## 2020-02-05 DIAGNOSIS — Z87891 Personal history of nicotine dependence: Secondary | ICD-10-CM | POA: Diagnosis not present

## 2020-02-05 DIAGNOSIS — I130 Hypertensive heart and chronic kidney disease with heart failure and stage 1 through stage 4 chronic kidney disease, or unspecified chronic kidney disease: Secondary | ICD-10-CM | POA: Insufficient documentation

## 2020-02-05 DIAGNOSIS — Y9289 Other specified places as the place of occurrence of the external cause: Secondary | ICD-10-CM | POA: Diagnosis not present

## 2020-02-05 DIAGNOSIS — X58XXXA Exposure to other specified factors, initial encounter: Secondary | ICD-10-CM | POA: Diagnosis not present

## 2020-02-05 DIAGNOSIS — I5032 Chronic diastolic (congestive) heart failure: Secondary | ICD-10-CM | POA: Insufficient documentation

## 2020-02-05 DIAGNOSIS — Z20822 Contact with and (suspected) exposure to covid-19: Secondary | ICD-10-CM | POA: Insufficient documentation

## 2020-02-05 LAB — COMPREHENSIVE METABOLIC PANEL
ALT: 14 U/L (ref 0–44)
AST: 21 U/L (ref 15–41)
Albumin: 3.6 g/dL (ref 3.5–5.0)
Alkaline Phosphatase: 72 U/L (ref 38–126)
Anion gap: 9 (ref 5–15)
BUN: 30 mg/dL — ABNORMAL HIGH (ref 8–23)
CO2: 24 mmol/L (ref 22–32)
Calcium: 9.4 mg/dL (ref 8.9–10.3)
Chloride: 100 mmol/L (ref 98–111)
Creatinine, Ser: 1.28 mg/dL — ABNORMAL HIGH (ref 0.44–1.00)
GFR calc Af Amer: 43 mL/min — ABNORMAL LOW (ref >60)
GFR calc non Af Amer: 37 mL/min — ABNORMAL LOW (ref >60)
Glucose, Bld: 114 mg/dL — ABNORMAL HIGH (ref 70–99)
Potassium: 4.7 mmol/L (ref 3.5–5.1)
Sodium: 133 mmol/L — ABNORMAL LOW (ref 135–145)
Total Bilirubin: 1 mg/dL (ref 0.3–1.2)
Total Protein: 6.7 g/dL (ref 6.5–8.1)

## 2020-02-05 LAB — CBC WITH DIFFERENTIAL/PLATELET
Abs Immature Granulocytes: 0.09 10*3/uL — ABNORMAL HIGH (ref 0.00–0.07)
Basophils Absolute: 0.1 10*3/uL (ref 0.0–0.1)
Basophils Relative: 0 %
Eosinophils Absolute: 0.1 10*3/uL (ref 0.0–0.5)
Eosinophils Relative: 1 %
HCT: 40.2 % (ref 36.0–46.0)
Hemoglobin: 12.9 g/dL (ref 12.0–15.0)
Immature Granulocytes: 1 %
Lymphocytes Relative: 9 %
Lymphs Abs: 1.4 10*3/uL (ref 0.7–4.0)
MCH: 30.6 pg (ref 26.0–34.0)
MCHC: 32.1 g/dL (ref 30.0–36.0)
MCV: 95.3 fL (ref 80.0–100.0)
Monocytes Absolute: 1.1 10*3/uL — ABNORMAL HIGH (ref 0.1–1.0)
Monocytes Relative: 7 %
Neutro Abs: 12.6 10*3/uL — ABNORMAL HIGH (ref 1.7–7.7)
Neutrophils Relative %: 82 %
Platelets: 223 10*3/uL (ref 150–400)
RBC: 4.22 MIL/uL (ref 3.87–5.11)
RDW: 13.3 % (ref 11.5–15.5)
WBC: 15.3 10*3/uL — ABNORMAL HIGH (ref 4.0–10.5)
nRBC: 0 % (ref 0.0–0.2)

## 2020-02-05 MED ORDER — SODIUM CHLORIDE 0.9 % IV BOLUS
500.0000 mL | Freq: Once | INTRAVENOUS | Status: AC
  Administered 2020-02-05: 500 mL via INTRAVENOUS

## 2020-02-05 NOTE — ED Provider Notes (Signed)
[facility] EMERGENCY DEPARTMENT Provider Note   CSN: [id number] Arrival date & time: 02/05/20  2119     History Chief Complaint  Patient presents with  . Loss of Consciousness    Morgan Weaver is a 86 y.o. female with past medical history significant for depression, anemia, dementia, hypothyroidism, paroxysmal A. fib, diastolic heart failure, CKD 3, sacral ulcer who presents after witnessed syncopal episode today.  History obtained from ED triage notes. Patients MAR at bedside.      Past Medical History:  Diagnosis Date  . Arthritis   . Hemorrhoid   . Hyperlipidemia   . Hypertension   . Lyme disease   . Ovarian cyst   . Syncope    3x  . Thyroid disease    hypothyroidism  . Torn rotator cuff   . Weight decrease     Patient Active Problem List   Diagnosis Date Noted  . AKI (acute kidney injury) ([place]) 04/14/2018  . Acute blood loss anemia 04/14/2018  . Pressure injury of skin 04/13/2018  . Fracture of femoral neck, right ([place]) 04/12/2018  . Chronic diastolic CHF (congestive heart failure) ([place]) 04/12/2018  . CKD (chronic kidney disease) stage 3, GFR 30-59 ml/min 04/12/2018  . Depression 04/12/2018  . Atrial fibrillation ([place]) 04/12/2018  . Dementia with behavioral disturbance ([place]) 01/02/2018  . Urticaria 08/04/2017  . Sinus bradycardia   . Syncope 03/03/2017  . Atrial fibrillation with RVR ([place])   . Heart block   . Transient hypotension   . Paroxysmal atrial fibrillation ([facility])   . Sinus pause   . Hypokalemia   . Dementia ([place]) 02/15/2015  . Memory loss 08/01/2014  . B12 deficiency 08/01/2014  . Hyponatremia 03/18/2013  . Closed left hip fracture ([place]) 03/16/2013  . Thrombocytopenia, unspecified ([place]) 11/17/2012  . Leukopenia 11/17/2012  . Syncope and collapse 11/16/2012  . Acute UTI 11/16/2012  . Hypertension 11/16/2012  . Hypothyroid 11/16/2012  . Rash 11/16/2012  . Pruritus ani 04/16/2011    Past Surgical History:  Procedure  Laterality Date  . ABDOMINAL HYSTERECTOMY  1970  . APPENDECTOMY  1952  . HIP ARTHROPLASTY Left 03/16/2013   bipolar        Dr Wendee Beavers  . HIP ARTHROPLASTY Left 03/17/2013   Procedure: ARTHROPLASTY BIPOLAR HIP;  Surgeon: Augustin Schooling, MD;  Location: [place];  Service: Orthopedics;  Laterality: Left;  . HIP ARTHROPLASTY Right 04/13/2018   Procedure: ARTHROPLASTY BIPOLAR HIP (HEMIARTHROPLASTY);  Surgeon: Shona Needles, MD;  Location: [place];  Service: Orthopedics;  Laterality: Right;  . KNEE ARTHROSCOPY    . OOPHORECTOMY  1955   removed due to the ovary having gangrene  . OTHER SURGICAL HISTORY     left foot surgery  . OTHER SURGICAL HISTORY     right foot surgery  . [place]   left  . TONSILLECTOMY       OB History   No obstetric history on file.     Family History  Problem Relation Age of Onset  . Heart disease Father        heart attack  . Hypertension Father   . Other Son        brain tumor     Social History   Tobacco Use  . Smoking status: Former Research scientist (life sciences)  . Smokeless tobacco: Never Used  . Tobacco comment: quit-1977  Vaping Use  . Vaping Use: Never used  Substance Use Topics  . Alcohol use: Not  Currently    Alcohol/week: 14.0 standard drinks    Types: 14 Glasses of wine per week    Comment: BEFORE DINNER  . Drug use: No    Home Medications Prior to Admission medications   Medication Sig Start Date End Date Taking? Authorizing Provider  amLODipine (NORVASC) 5 MG tablet Take 1 tablet (5 mg total) by mouth daily. 04/19/18   Samuella Cota, MD  Cyanocobalamin (VITAMIN B-12 PO) Take 1 tablet by mouth daily.    [provider]  enoxaparin (LOVENOX) 40 MG/0.4ML injection Inject 0.4 mLs (40 mg total) into the skin daily. 30 days total for DVT prophylaxis s/p hip replacement. Last dose 11/30. 04/18/18   Samuella Cota, MD  HYDROcodone-acetaminophen (NORCO/VICODIN) 5-325 MG tablet Take 1-2 tablets by mouth every 6 (six) hours as needed for  moderate pain or severe pain (1 tab for moderate pain, 2 for severe). 04/17/18   Samuella Cota, MD  levothyroxine (SYNTHROID, LEVOTHROID) 137 MCG tablet Take 137 mcg by mouth daily before breakfast.  12/15/17   [provider]  OVER THE COUNTER MEDICATION Take 1 tablet by mouth daily. Vitamins D3    [provider]  sertraline (ZOLOFT) 25 MG tablet Take 1 tablet (25 mg total) by mouth daily. 12/31/17   Melvenia Beam, MD    Allergies    Exelon [rivastigmine tartrate], Aricept Reather [place] hcl], Mobic [meloxicam], Namenda [memantine hcl], Tetracyclines & related, Vancomycin, Tape, Ancef [cefazolin], and Sulfa drugs cross reactors  Review of Systems   Review of Systems  Unable to perform ROS: Dementia    Physical Exam Updated Vital Signs BP 99/70   Pulse 73   Temp 97.9 F (36.6 C) (Oral)   Resp 17   SpO2 95%   Physical Exam Vitals reviewed.  Constitutional:      General: She is not in acute distress.    Comments: Sleepy but easily arousable   HENT:     Head: Normocephalic and atraumatic.     Nose: Nose normal.     Mouth/Throat:     Mouth: Mucous membranes are dry.     Pharynx: No oropharyngeal exudate or posterior oropharyngeal erythema.  Eyes:     Extraocular Movements: Extraocular movements intact.     Conjunctiva/sclera: Conjunctivae normal.     Pupils: Pupils are equal, round, and reactive to light.  Cardiovascular:     Rate and Rhythm: Normal rate and regular rhythm.     Pulses: Normal pulses.  Pulmonary:     Effort: Pulmonary effort is normal.     Breath sounds: Wheezing present. No rales.  Abdominal:     General: Abdomen is flat. Bowel sounds are normal. There is no distension.     Palpations: Abdomen is soft.     Tenderness: There is no abdominal tenderness. There is no guarding.  Musculoskeletal:        General: No swelling.     Cervical back: Normal range of motion and neck supple.     Comments: Mild contracture of b/l lower extremities.  Lower extremities cold to touch   Skin:    Capillary Refill: Capillary refill takes less than 2 seconds.  Neurological:     Mental Status: She is alert. She is disoriented.     Cranial Nerves: Cranial nerves are intact.     Motor: Motor function is intact.     Deep Tendon Reflexes:     Reflex Scores:      Patellar reflexes are 2+ on the right  side and 2+ on the left side.    Comments: Oriented to self. Not to place, year, president.   Psychiatric:        Behavior: Behavior normal.     ED Results / Procedures / Treatments   Labs (all labs ordered are listed, but only abnormal results are displayed) Labs Reviewed  COMPREHENSIVE METABOLIC PANEL - Abnormal; Notable for the following components:      Result Value   Sodium 133 (*)    Glucose, Bld 114 (*)    BUN 30 (*)    Creatinine, Ser 1.28 (*)    GFR calc non Af Amer 37 (*)    GFR calc Af Amer 43 (*)    All other components within normal limits  CBC WITH DIFFERENTIAL/PLATELET - Abnormal; Notable for the following components:   WBC 15.3 (*)    Neutro Abs 12.6 (*)    Monocytes Absolute 1.1 (*)    Abs Immature Granulocytes 0.09 (*)    All other components within normal limits  SARS CORONAVIRUS 2 BY RT PCR (HOSPITAL ORDER, [place] [facility])  URINALYSIS, ROUTINE W REFLEX MICROSCOPIC    EKG EKG Interpretation  Date/Time:  Monday February 05 2020 21:26:14 EDT Ventricular Rate:  67 PR Interval:    QRS Duration: 93 QT Interval:  405 QTC Calculation: 428 R Axis:   -19 Text Interpretation: Sinus rhythm Ventricular premature complex Borderline left axis deviation Low voltage, precordial leads No significant change since last tracing Confirmed by Blanchie Dessert [phone]) on 02/05/2020 10:07:17 PM   Radiology DG Chest Portable 1 View  Result Date: 02/05/2020 CLINICAL DATA:  Leukocytosis EXAM: PORTABLE CHEST 1 VIEW COMPARISON:  04/12/2018 FINDINGS: Heart is normal size. Linear bibasilar atelectasis or scarring.  No effusions. No acute bony abnormality. IMPRESSION: Bibasilar atelectasis or scarring. Electronically Signed   By: Rolm Baptise M.D.   On: 02/05/2020 23:15    Procedures Procedures (including critical care time)  Medications Ordered in ED Medications  sodium chloride 0.9 % bolus 500 mL (has no administration in time range)    ED Course  I have reviewed the triage vital signs and the nursing notes.  Pertinent labs & imaging results that were available during my care of the patient were reviewed by me and considered in my medical decision making (see chart for details).    MDM Rules/Calculators/A&P                           KATIANNE BARRE is a 86 y.o. female with past medical history significant for anemia, dementia, hypothyroidism, paroxysmal A. fib, diastolic heart failure, CKD 3, sacral ulcer who presents after witnessed syncopal episode today x 2. Patient noted to have hypotension by EMS s/p 200 cc NS bolus.  In the ED, she is easily arousable but sleepy. She is oriented to herself only. RRR, mild end expiratory wheezing bilaterally but no respiratory distress. EKG sinus rhythm with occasional PVC. Labs currently pending. MAR reviewed.  On chart review, patient admitted in 2018 and 2019 for unspecified syncope. First admission in September 2018 with negative work up and attributed to mechanical fall with gait and balance issues. In February 2019, admitted for LOC and workup was negative. Prior to this admission, patient was seen by Dr. Rayann Heman (cardiologist) who discussed implantable loop recorder vs conservative management. Family chose conservative management at that time.   Patient's son at bedside and reports that she has not been eating  much recently. Discussed plans to review labs and likely discharge back to facility if work up is normal given multiple previous negative workups. Patient's son is agreeable to plan.   Renal function is stable. Patient with mild hyponatremia to 133  (possibly due to decreased PO intake?). CBC returned with leukocytosis to 15.3 and ANC 12.6. Vital signs s/f BP 100/80. Will provide 1/2 L fluid bolus. Obtain UA and COVID testing. Additionally, CXR given mild end expiratory wheezing.    CXR returns with mild blunting of costophrenic angles, low lung volumes, atelectasis. Case has been discussed with Dr. Maryan Rued who will continue to manage this patient. Please see her note for further MDM and assessment/plan.  Final Clinical Impression(s) / ED Diagnoses Final diagnoses:  Syncope, unspecified syncope type  Neutrophilia    Rx / DC Orders ED Discharge Orders    None       Wilber Oliphant, MD 02/05/20 9470    Blanchie Dessert, MD 02/05/20 2356

## 2020-02-05 NOTE — Discharge Instructions (Addendum)
Urine today did show some signs of infection.  It has been sent for culture.  Will be notified if antibiotics needs to be changed. BP was a little soft here, please monitor at facility. Follow-up with your primary care doctor. Return here for any new/acute changes.

## 2020-02-05 NOTE — ED Triage Notes (Signed)
Pt arrives from [facility] where she was being assisted to bed by staff and had a LOC for 2 minutes while laying down. Staff didn't witness any seizure like activity. After pt regained consciousness she told staff she needed to go to the bathroom, they assisted and pt had another syncopal episode while having a BM. Pt didn't fall. History of dementia. Pt hypotensive with EMS, 78/56. Received 257mL NS and 4mg  zofran.

## 2020-02-06 LAB — URINALYSIS, ROUTINE W REFLEX MICROSCOPIC
Bilirubin Urine: NEGATIVE
Glucose, UA: NEGATIVE mg/dL
Hgb urine dipstick: NEGATIVE
Ketones, ur: NEGATIVE mg/dL
Nitrite: NEGATIVE
Protein, ur: NEGATIVE mg/dL
Specific Gravity, Urine: 1.02 (ref 1.005–1.030)
WBC, UA: >50 WBC/hpf — ABNORMAL HIGH (ref 0–5)
pH: 6 (ref 5.0–8.0)

## 2020-02-06 LAB — SARS CORONAVIRUS 2 BY RT PCR (HOSPITAL ORDER, PERFORMED IN ~~LOC~~ HOSPITAL LAB): SARS Coronavirus 2: NEGATIVE

## 2020-02-06 MED ORDER — NITROFURANTOIN MONOHYD MACRO 100 MG PO CAPS: 100.0000 mg | ORAL_CAPSULE | Freq: Two times a day (BID) | ORAL | 0 refills | Status: AC

## 2020-02-06 MED ORDER — NITROFURANTOIN MONOHYD MACRO 100 MG PO CAPS
100.0000 mg | ORAL_CAPSULE | Freq: Once | ORAL | Status: AC
  Administered 2020-02-06: 100 mg via ORAL
  Filled 2020-02-06: qty 1

## 2020-02-06 NOTE — ED Provider Notes (Signed)
Assumed care from Dr. Maryan Rued.  See prior notes for full H&P.  Briefly, 86 y.o. F here with syncope while being moved at her facility.  Hx of same felt to be cardiogenic, however son has refused pacemaker or other interventions previously.  She is primarily bedbound.  BP has been mildly soft here but did improve with fluids. Labs with BUN 30, may be component of dehydration as well.  She is on amlodipine so will need to watch this at facility.  No fever or tachycardia to suggest sepsis.  Son agreeable to discharge back to facility.  Plan:  UA, covid screen pending.  If UA infectious, dose of abx and can discharge back to facility.    Results for orders placed or performed during the hospital encounter of 02/05/20  SARS Coronavirus 2 by RT PCR (hospital order, performed in [place] hospital lab) Nasopharyngeal Nasopharyngeal Swab   Specimen: Nasopharyngeal Swab  Result Value Ref Range   SARS Coronavirus 2 NEGATIVE NEGATIVE  Comprehensive metabolic panel  Result Value Ref Range   Sodium 133 (L) 135 - 145 mmol/L   Potassium 4.7 3.5 - 5.1 mmol/L   Chloride 100 98 - 111 mmol/L   CO2 24 22 - 32 mmol/L   Glucose, Bld 114 (H) 70 - 99 mg/dL   BUN 30 (H) 8 - 23 mg/dL   Creatinine, Ser 1.28 (H) 0.44 - 1.00 mg/dL   Calcium 9.4 8.9 - 10.3 mg/dL   Total Protein 6.7 6.5 - 8.1 g/dL   Albumin 3.6 3.5 - 5.0 g/dL   AST 21 15 - 41 U/L   ALT 14 0 - 44 U/L   Alkaline Phosphatase 72 38 - 126 U/L   Total Bilirubin 1.0 0.3 - 1.2 mg/dL   GFR calc non Af Amer 37 (L) >60 mL/min   GFR calc Af Amer 43 (L) >60 mL/min   Anion gap 9 5 - 15  CBC with Differential  Result Value Ref Range   WBC 15.3 (H) 4.0 - 10.5 K/uL   RBC 4.22 3.87 - 5.11 MIL/uL   Hemoglobin 12.9 12.0 - 15.0 g/dL   HCT 40.2 36 - 46 %   MCV 95.3 80.0 - 100.0 fL   MCH 30.6 26.0 - 34.0 pg   MCHC 32.1 30.0 - 36.0 g/dL   RDW 13.3 11.5 - 15.5 %   Platelets 223 150 - 400 K/uL   nRBC 0.0 0.0 - 0.2 %   Neutrophils Relative % 82 %   Neutro Abs  12.6 (H) 1.7 - 7.7 K/uL   Lymphocytes Relative 9 %   Lymphs Abs 1.4 0.7 - 4.0 K/uL   Monocytes Relative 7 %   Monocytes Absolute 1.1 (H) 0 - 1 K/uL   Eosinophils Relative 1 %   Eosinophils Absolute 0.1 0 - 0 K/uL   Basophils Relative 0 %   Basophils Absolute 0.1 0 - 0 K/uL   Immature Granulocytes 1 %   Abs Immature Granulocytes 0.09 (H) 0.00 - 0.07 K/uL  Urinalysis, Routine w reflex microscopic Urine, Catheterized  Result Value Ref Range   Color, Urine YELLOW YELLOW   APPearance HAZY (A) CLEAR   Specific Gravity, Urine 1.020 1.005 - 1.030   pH 6.0 5.0 - 8.0   Glucose, UA NEGATIVE NEGATIVE mg/dL   Hgb urine dipstick NEGATIVE NEGATIVE   Bilirubin Urine NEGATIVE NEGATIVE   Ketones, ur NEGATIVE NEGATIVE mg/dL   Protein, ur NEGATIVE NEGATIVE mg/dL   Nitrite NEGATIVE NEGATIVE   Leukocytes,Ua LARGE (A)  NEGATIVE   RBC / HPF 0-5 0 - 5 RBC/hpf   WBC, UA >50 (H) 0 - 5 WBC/hpf   Bacteria, UA RARE (A) NONE SEEN   Squamous Epithelial / LPF 0-5 0 - 5   Mucus PRESENT    DG Chest Portable 1 View  Result Date: 02/05/2020 CLINICAL DATA:  Leukocytosis EXAM: PORTABLE CHEST 1 VIEW COMPARISON:  04/12/2018 FINDINGS: Heart is normal size. Linear bibasilar atelectasis or scarring. No effusions. No acute bony abnormality. IMPRESSION: Bibasilar atelectasis or scarring. Electronically Signed   By: Rolm Baptise M.D.   On: 02/05/2020 23:15    1:13 AM UA appears infectious with large leuks, >50 WBC, rare bacteria.  Will send for culture.  She has allergies to sulfa, cephalosporins, tetracyclines.  Will start course of macrobid pending urine culture.  Patient has remained hemodynamically stable.  Son made aware of findings today.  Aware we are starting antibiotics, agreeable to discharge back to facility.   Larene Pickett, PA-C 02/06/20 [id number]    Randal Buba, April, MD 02/06/20 0230

## 2020-02-08 LAB — URINE CULTURE: Culture: >=100000 — AB

## 2020-02-09 ENCOUNTER — Telehealth: Payer: Self-pay | Admitting: *Deleted

## 2020-02-09 NOTE — Telephone Encounter (Signed)
Post ED Visit - Positive Culture Follow-up  Culture report reviewed by antimicrobial stewardship pharmacist: [place] Team []  Elenor Quinones, Pharm.D. []  Heide Guile, Pharm.D., BCPS AQ-ID []  Parks Neptune, Pharm.D., BCPS []  Alycia Rossetti, Pharm.D., BCPS []  [place], [place].D., BCPS, AAHIVP []  Legrand Como, Pharm.D., BCPS, AAHIVP []  Salome Arnt, PharmD, BCPS []  Johnnette Gourd, PharmD, BCPS []  Hughes Better, PharmD, BCPS []  Leeroy Cha, PharmD []  Laqueta Linden, PharmD, BCPS []  Albertina Parr, PharmD  [place] Team []  Leodis Sias, PharmD []  Lindell Spar, PharmD []  Royetta Asal, PharmD []  Graylin Shiver, Rph []  Rema Fendt) Glennon Mac, PharmD []  Arlyn Dunning, PharmD []  Netta Cedars, PharmD []  Dia Sitter, PharmD []  Leone Haven, PharmD []  Gretta Arab, PharmD []  Theodis Shove, PharmD []  Peggyann Juba, PharmD []  Reuel Boom, PharmD   Positive urine culture Likely dehydration, no treatment indicated and no further patient follow-up is required at this time. Dr. Deno Etienne  Ardeen Fillers 02/09/2020, 3:26 PM

## 2020-09-02 ENCOUNTER — Emergency Department (HOSPITAL_COMMUNITY)
Admission: EM | Admit: 2020-09-02 | Discharge: 2020-09-02 | Disposition: A | Discharge status: Home / Self Care | Payer: Medicare Other | Attending: Emergency Medicine | Admitting: Emergency Medicine

## 2020-09-02 ENCOUNTER — Other Ambulatory Visit: Payer: Self-pay

## 2020-09-02 ENCOUNTER — Encounter (HOSPITAL_COMMUNITY): Payer: Self-pay

## 2020-09-02 DIAGNOSIS — R112 Nausea with vomiting, unspecified: Secondary | ICD-10-CM | POA: Diagnosis present

## 2020-09-02 DIAGNOSIS — I5032 Chronic diastolic (congestive) heart failure: Secondary | ICD-10-CM | POA: Diagnosis not present

## 2020-09-02 DIAGNOSIS — E039 Hypothyroidism, unspecified: Secondary | ICD-10-CM | POA: Insufficient documentation

## 2020-09-02 DIAGNOSIS — N183 Chronic kidney disease, stage 3 unspecified: Secondary | ICD-10-CM | POA: Insufficient documentation

## 2020-09-02 DIAGNOSIS — Z79899 Other long term (current) drug therapy: Secondary | ICD-10-CM | POA: Diagnosis not present

## 2020-09-02 DIAGNOSIS — I130 Hypertensive heart and chronic kidney disease with heart failure and stage 1 through stage 4 chronic kidney disease, or unspecified chronic kidney disease: Secondary | ICD-10-CM | POA: Insufficient documentation

## 2020-09-02 DIAGNOSIS — Z96642 Presence of left artificial hip joint: Secondary | ICD-10-CM | POA: Insufficient documentation

## 2020-09-02 DIAGNOSIS — R1110 Vomiting, unspecified: Secondary | ICD-10-CM

## 2020-09-02 DIAGNOSIS — Z87891 Personal history of nicotine dependence: Secondary | ICD-10-CM | POA: Insufficient documentation

## 2020-09-02 DIAGNOSIS — R197 Diarrhea, unspecified: Secondary | ICD-10-CM | POA: Diagnosis not present

## 2020-09-02 DIAGNOSIS — F0390 Unspecified dementia without behavioral disturbance: Secondary | ICD-10-CM | POA: Insufficient documentation

## 2020-09-02 DIAGNOSIS — R1084 Generalized abdominal pain: Secondary | ICD-10-CM | POA: Diagnosis not present

## 2020-09-02 LAB — COMPREHENSIVE METABOLIC PANEL
ALT: 13 U/L (ref 0–44)
AST: 22 U/L (ref 15–41)
Albumin: 4.6 g/dL (ref 3.5–5.0)
Alkaline Phosphatase: 89 U/L (ref 38–126)
Anion gap: 12 (ref 5–15)
BUN: 36 mg/dL — ABNORMAL HIGH (ref 8–23)
CO2: 23 mmol/L (ref 22–32)
Calcium: 9.8 mg/dL (ref 8.9–10.3)
Chloride: 96 mmol/L — ABNORMAL LOW (ref 98–111)
Creatinine, Ser: 1.36 mg/dL — ABNORMAL HIGH (ref 0.44–1.00)
GFR, Estimated: 37 mL/min — ABNORMAL LOW (ref >60)
Glucose, Bld: 130 mg/dL — ABNORMAL HIGH (ref 70–99)
Potassium: 5.1 mmol/L (ref 3.5–5.1)
Sodium: 131 mmol/L — ABNORMAL LOW (ref 135–145)
Total Bilirubin: 1.8 mg/dL — ABNORMAL HIGH (ref 0.3–1.2)
Total Protein: 8.2 g/dL — ABNORMAL HIGH (ref 6.5–8.1)

## 2020-09-02 LAB — CBC WITH DIFFERENTIAL/PLATELET
Abs Immature Granulocytes: 0.05 10*3/uL (ref 0.00–0.07)
Basophils Absolute: 0 10*3/uL (ref 0.0–0.1)
Basophils Relative: 0 %
Eosinophils Absolute: 0 10*3/uL (ref 0.0–0.5)
Eosinophils Relative: 0 %
HCT: 47.4 % — ABNORMAL HIGH (ref 36.0–46.0)
Hemoglobin: 15.5 g/dL — ABNORMAL HIGH (ref 12.0–15.0)
Immature Granulocytes: 1 %
Lymphocytes Relative: 2 %
Lymphs Abs: 0.2 10*3/uL — ABNORMAL LOW (ref 0.7–4.0)
MCH: 31.2 pg (ref 26.0–34.0)
MCHC: 32.7 g/dL (ref 30.0–36.0)
MCV: 95.4 fL (ref 80.0–100.0)
Monocytes Absolute: 0.3 10*3/uL (ref 0.1–1.0)
Monocytes Relative: 3 %
Neutro Abs: 9.8 10*3/uL — ABNORMAL HIGH (ref 1.7–7.7)
Neutrophils Relative %: 94 %
Platelets: 180 10*3/uL (ref 150–400)
RBC: 4.97 MIL/uL (ref 3.87–5.11)
RDW: 13 % (ref 11.5–15.5)
WBC: 10.4 10*3/uL (ref 4.0–10.5)
nRBC: 0 % (ref 0.0–0.2)

## 2020-09-02 LAB — URINALYSIS, ROUTINE W REFLEX MICROSCOPIC
Bilirubin Urine: NEGATIVE
Glucose, UA: NEGATIVE mg/dL
Hgb urine dipstick: NEGATIVE
Ketones, ur: NEGATIVE mg/dL
Leukocytes,Ua: NEGATIVE
Nitrite: NEGATIVE
Protein, ur: NEGATIVE mg/dL
Specific Gravity, Urine: 1.02 (ref 1.005–1.030)
pH: 5 (ref 5.0–8.0)

## 2020-09-02 MED ORDER — SODIUM CHLORIDE 0.9 % IV BOLUS
1000.0000 mL | Freq: Once | INTRAVENOUS | Status: AC
  Administered 2020-09-02: 1000 mL via INTRAVENOUS

## 2020-09-02 MED ORDER — ONDANSETRON HCL 4 MG/2ML IJ SOLN
4.0000 mg | Freq: Once | INTRAMUSCULAR | Status: AC
  Administered 2020-09-02: 4 mg via INTRAVENOUS
  Filled 2020-09-02: qty 2

## 2020-09-02 NOTE — ED Triage Notes (Signed)
Brought in by EMS from memory care at [facility]. Diarrhea x1 day and vomiting started this AM. Pt states "she doesn't feel well"

## 2020-09-02 NOTE — ED Notes (Addendum)
Patient given 4 bites of applesauce and half cup of ice water for PO challenge.

## 2020-09-02 NOTE — ED Provider Notes (Signed)
[facility] DEPT Provider Note   CSN: [id number] Arrival date & time: 09/02/20  0631     History Chief Complaint  Patient presents with  . Diarrhea  . Emesis    Morgan Weaver is a 87 y.o. female possible history of hypertension, hyperlipidemia, dementia brought in by EMS from [place] for evaluation of nausea/vomiting/diarrhea.  Patient with baseline history of dementia cannot provide much history.  I discussed with the nurse who states that she has had diarrhea since yesterday.  They do not think there has been any blood in stools.  No recent antibiotic use.  They called EMS and she had an episode of vomiting, prompting ED visit.  Staff does report patient is at baseline.  EM LEVEL 5 CAVEAT DUE TO DEMENTIA  The history is provided by the nursing home.       Past Medical History:  Diagnosis Date  . Arthritis   . Hemorrhoid   . Hyperlipidemia   . Hypertension   . Lyme disease   . Ovarian cyst   . Syncope    3x  . Thyroid disease    hypothyroidism  . Torn rotator cuff   . Weight decrease     Patient Active Problem List   Diagnosis Date Noted  . AKI (acute kidney injury) ([place]) 04/14/2018  . Acute blood loss anemia 04/14/2018  . Pressure injury of skin 04/13/2018  . Fracture of femoral neck, right ([place]) 04/12/2018  . Chronic diastolic CHF (congestive heart failure) ([place]) 04/12/2018  . CKD (chronic kidney disease) stage 3, GFR 30-59 ml/min ([facility]) 04/12/2018  . Depression 04/12/2018  . Atrial fibrillation ([place]) 04/12/2018  . Dementia with behavioral disturbance ([place]) 01/02/2018  . Urticaria 08/04/2017  . Sinus bradycardia   . Syncope 03/03/2017  . Atrial fibrillation with RVR ([place])   . Heart block   . Transient hypotension   . Paroxysmal atrial fibrillation ([facility])   . Sinus pause   . Hypokalemia   . Dementia ([place]) 02/15/2015  . Memory loss 08/01/2014  . B12 deficiency 08/01/2014  . Hyponatremia 03/18/2013  .  Closed left hip fracture ([place]) 03/16/2013  . Thrombocytopenia, unspecified ([place]) 11/17/2012  . Leukopenia 11/17/2012  . Syncope and collapse 11/16/2012  . Acute UTI 11/16/2012  . Hypertension 11/16/2012  . Hypothyroid 11/16/2012  . Rash 11/16/2012  . Pruritus ani 04/16/2011    Past Surgical History:  Procedure Laterality Date  . ABDOMINAL HYSTERECTOMY  1970  . APPENDECTOMY  1952  . HIP ARTHROPLASTY Left 03/16/2013   bipolar        Dr Wendee Beavers  . HIP ARTHROPLASTY Left 03/17/2013   Procedure: ARTHROPLASTY BIPOLAR HIP;  Surgeon: Augustin Schooling, MD;  Location: [facility];  Service: Orthopedics;  Laterality: Left;  . HIP ARTHROPLASTY Right 04/13/2018   Procedure: ARTHROPLASTY BIPOLAR HIP (HEMIARTHROPLASTY);  Surgeon: Shona Needles, MD;  Location: [facility];  Service: Orthopedics;  Laterality: Right;  . KNEE ARTHROSCOPY    . OOPHORECTOMY  1955   removed due to the ovary having gangrene  . OTHER SURGICAL HISTORY     left foot surgery  . OTHER SURGICAL HISTORY     right foot surgery  . [place]   left  . TONSILLECTOMY       OB History   No obstetric history on file.     Family History  Problem Relation Age of Onset  . Heart disease Father  heart attack  . Hypertension Father   . Other Son        brain tumor     Social History   Tobacco Use  . Smoking status: Former Research scientist (life sciences)  . Smokeless tobacco: Never Used  . Tobacco comment: quit-1977  Vaping Use  . Vaping Use: Never used  Substance Use Topics  . Alcohol use: Not Currently    Alcohol/week: 14.0 standard drinks    Types: 14 Glasses of wine per week    Comment: BEFORE DINNER  . Drug use: No    Home Medications Prior to Admission medications   Medication Sig Start Date End Date Taking? Authorizing Provider  amLODipine (NORVASC) 5 MG tablet Take 1 tablet (5 mg total) by mouth daily. 04/19/18   Samuella Cota, MD  Cyanocobalamin (VITAMIN B-12 PO) Take 1 tablet by mouth daily.    [provider]  enoxaparin (LOVENOX) 40 MG/0.4ML injection Inject 0.4 mLs (40 mg total) into the skin daily. 30 days total for DVT prophylaxis s/p hip replacement. Last dose 11/30. 04/18/18   Samuella Cota, MD  HYDROcodone-acetaminophen (NORCO/VICODIN) 5-325 MG tablet Take 1-2 tablets by mouth every 6 (six) hours as needed for moderate pain or severe pain (1 tab for moderate pain, 2 for severe). 04/17/18   Samuella Cota, MD  levothyroxine (SYNTHROID, LEVOTHROID) 137 MCG tablet Take 137 mcg by mouth daily before breakfast.  12/15/17   [provider]  nitrofurantoin, macrocrystal-monohydrate, (MACROBID) 100 MG capsule Take 1 capsule (100 mg total) by mouth 2 (two) times daily. 02/06/20   Larene Pickett, PA-C  OVER THE COUNTER MEDICATION Take 1 tablet by mouth daily. Vitamins D3    [provider]  sertraline (ZOLOFT) 25 MG tablet Take 1 tablet (25 mg total) by mouth daily. 12/31/17   Melvenia Beam, MD    Allergies    Ciprofloxacin, Exelon [rivastigmine tartrate], Aricept [donepezil hcl], Mobic [meloxicam], Namenda [memantine hcl], Tetracyclines & related, Vancomycin, Tape, Ancef [cefazolin], and Sulfa drugs cross reactors  Review of Systems   Review of Systems  Unable to perform ROS: Dementia    Physical Exam Updated Vital Signs BP 105/83   Pulse 73   Temp 98.3 F (36.8 C) (Axillary)   Resp 17   Ht 5\' 3"  (1.6 m)   Wt 66.5 kg   SpO2 100%   BMI 25.97 kg/m   Physical Exam Vitals and nursing note reviewed.  Constitutional:      Appearance: Normal appearance. She is well-developed.  HENT:     Head: Normocephalic and atraumatic.     Mouth/Throat:     Mouth: Mucous membranes are dry.     Comments: Dry MMM Eyes:     General: Lids are normal.     Conjunctiva/sclera: Conjunctivae normal.     Pupils: Pupils are equal, round, and reactive to light.  Cardiovascular:     Rate and Rhythm: Normal rate and regular rhythm.     Pulses: Normal pulses.     Heart  sounds: Normal heart sounds. No murmur heard. No friction rub. No gallop.   Pulmonary:     Effort: Pulmonary effort is normal.     Breath sounds: Normal breath sounds.     Comments: Lungs clear to auscultation bilaterally.  Symmetric chest rise.  No wheezing, rales, rhonchi. Abdominal:     Palpations: Abdomen is soft. Abdomen is not rigid.     Tenderness: There is no abdominal tenderness. There is no guarding.  Comments: Abdomen is soft, non-distended, non-tender. No rigidity, No guarding. No peritoneal signs.  Musculoskeletal:        General: Normal range of motion.     Cervical back: Full passive range of motion without pain.  Skin:    General: Skin is warm and dry.     Capillary Refill: Capillary refill takes less than 2 seconds.  Neurological:     Mental Status: She is alert.     Comments: Oriented to name Follows commands, MAE Follows commands, Moves all extremities  5/5 strength to BUE and BLE   Psychiatric:        Speech: Speech normal.     ED Results / Procedures / Treatments   Labs (all labs ordered are listed, but only abnormal results are displayed) Labs Reviewed  COMPREHENSIVE METABOLIC PANEL - Abnormal; Notable for the following components:      Result Value   Sodium 131 (*)    Chloride 96 (*)    Glucose, Bld 130 (*)    BUN 36 (*)    Creatinine, Ser 1.36 (*)    Total Protein 8.2 (*)    Total Bilirubin 1.8 (*)    GFR, Estimated 37 (*)    All other components within normal limits  CBC WITH DIFFERENTIAL/PLATELET - Abnormal; Notable for the following components:   Hemoglobin 15.5 (*)    HCT 47.4 (*)    Neutro Abs 9.8 (*)    Lymphs Abs 0.2 (*)    All other components within normal limits  URINALYSIS, ROUTINE W REFLEX MICROSCOPIC    EKG None  Radiology No results found.  Procedures Procedures   Medications Ordered in ED Medications  sodium chloride 0.9 % bolus 1,000 mL (0 mLs Intravenous Stopped 09/02/20 0954)  ondansetron (ZOFRAN) injection 4  mg (4 mg Intravenous Given 09/02/20 0743)    ED Course  I have reviewed the triage vital signs and the nursing notes.  Pertinent labs & imaging results that were available during my care of the patient were reviewed by me and considered in my medical decision making (see chart for details).    MDM Rules/Calculators/A&P                          87 year old female who presents for evaluation of nausea, vomiting/diarrhea that has been ongoing for 1 day.  Patient with history of dementia and is at baseline per nursing home staff.  On initial arrival, she is afebrile nontoxic-appearing.  Vital signs are stable.  On exam, her abdomen is benign.  We will plan to check labs.  I discussed with Constance Holster (nurse at [facility]).  She states that since yesterday, patient has had diarrhea.  Reports no blood in the stools.  No recent antibiotic use.  She has not noted any fevers.  They report that she had an episode of vomiting today which is what prompted them to call EMS.  They report she has a history of baseline and can sometimes answer questions but is confused.  They report she is at baseline.  UA negative for any infectious etiology. CMP shows BUN of 36, Cr of 1.36. Anion gap is 12. CBC shows no leukocytosis. Hgb stable at 15.5.  Reevaluation.  Patient is resting comfortably.  Repeat abdominal exam is benign.  Do not feel that CT abdomen pelvis is warranted as do not suspect surgical etiology of her symptoms.  Will plan to PO challenge.   Per RN, patient able  to tolerate applesauce and water.   Reevaluation.  Patient resting comfortably.  No abdominal tenderness on exam.  Son is at bedside.  I discussed results with him.  He is reassured.  He agrees with plan for sending patient back home.  Patient resting comfortably.  We will plan to have [facility] transport patient home.  Portions of this note were generated with [employer]. Dictation errors may occur despite best attempts at  proofreading.   Final Clinical Impression(s) / ED Diagnoses Final diagnoses:  Generalized abdominal pain  Vomiting and diarrhea    Rx / DC Orders ED Discharge Orders    None       Desma Mcgregor 09/02/20 1526    Little, Wenda Overland, MD 09/04/20 [id number]

## 2020-09-02 NOTE — ED Notes (Signed)
Patient son Lisabeth Mian called, updated via phone and made aware of the visitor policy.

## 2020-09-02 NOTE — ED Notes (Signed)
Pt cleaned up. On arrival, pt was covered in diarrhea. Bed bath given and linen changed. Brief placed on pt

## 2020-09-02 NOTE — Discharge Instructions (Signed)
Please follow-up with your primary care doctor.  Return emergency department for any worsening vomiting, diarrhea or any other worsening concerning symptoms.

## 2020-09-02 NOTE — ED Notes (Signed)
Report given to Levcretis at [facility].

## 2022-04-23 IMAGING — DX DG CHEST 1V PORT
1 series · 1 of 1 positions shown · non-contrast
Comparison: 04/12/2018

CLINICAL DATA: Leukocytosis

EXAM:
PORTABLE CHEST 1 VIEW

[chest]
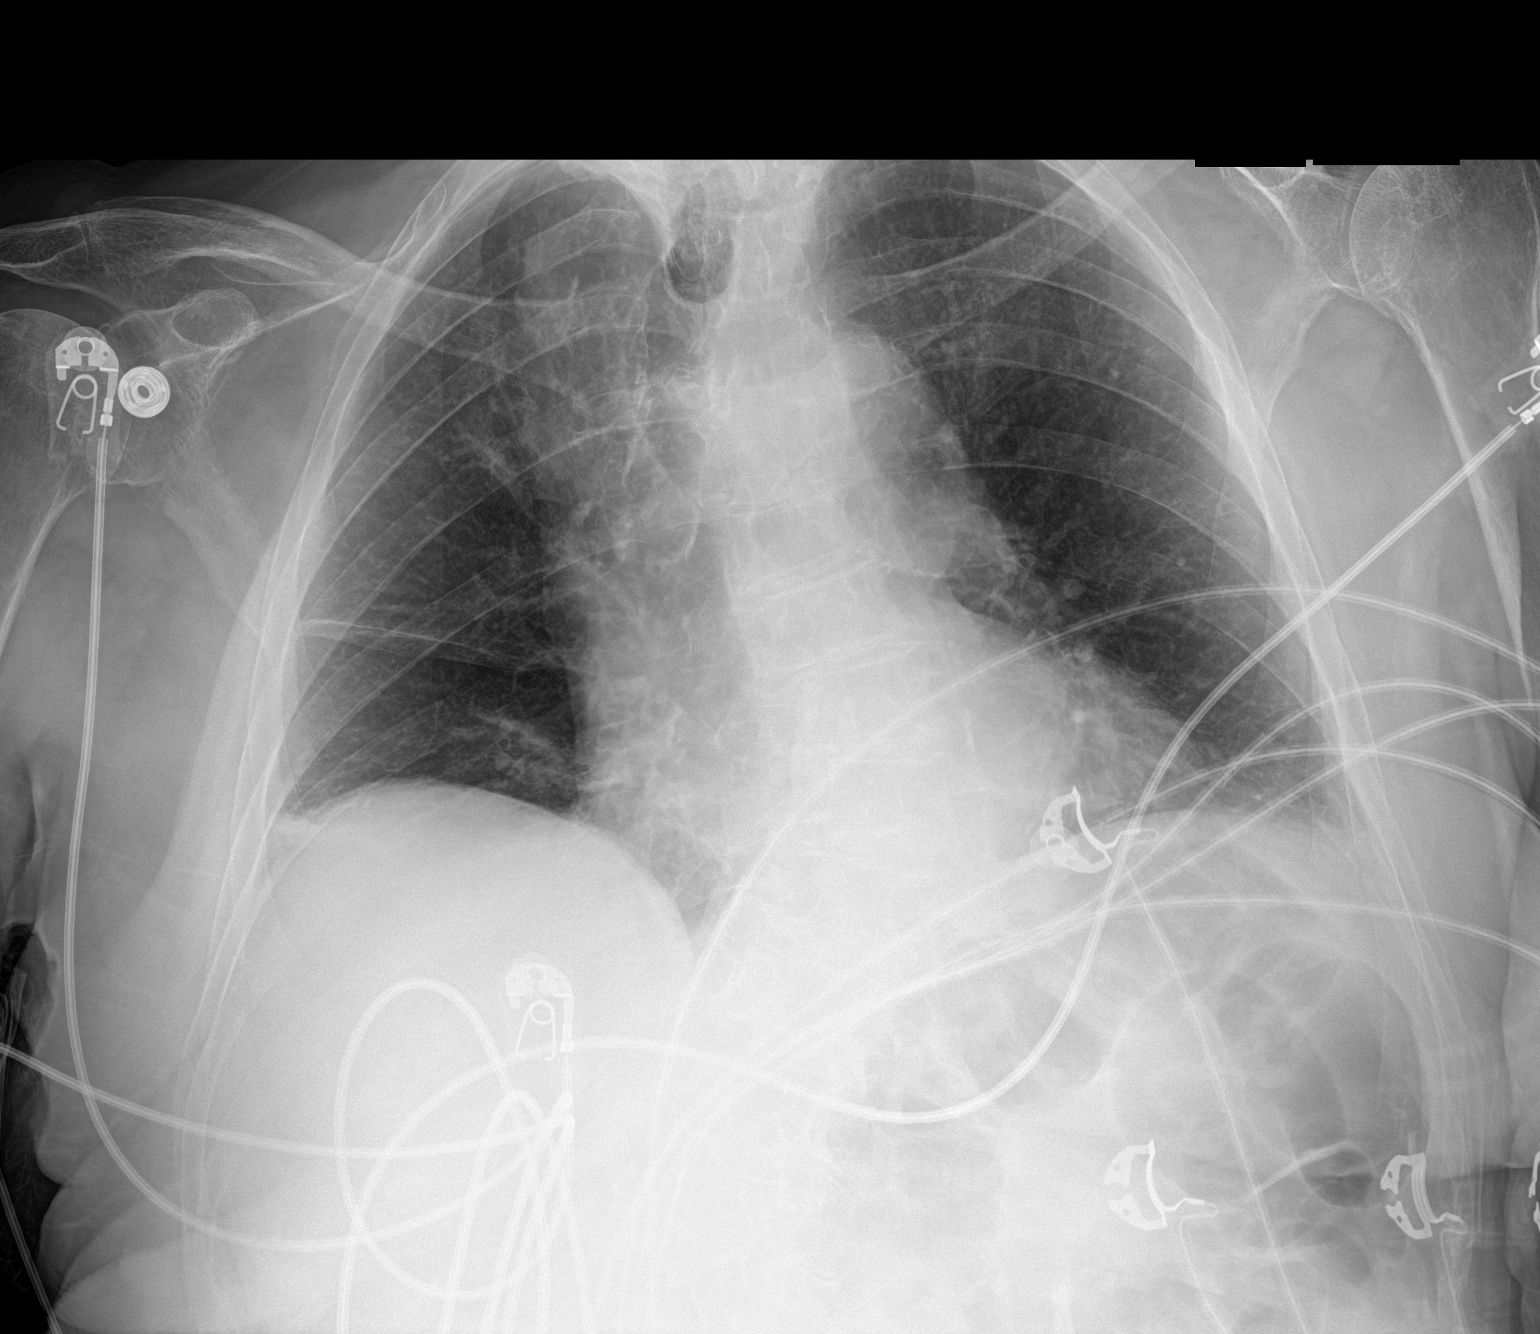

[1 of 1 positions shown; findings below may reference images not displayed]

FINDINGS: Heart is normal size. Linear bibasilar atelectasis or scarring. No
effusions. No acute bony abnormality.
IMPRESSION: Bibasilar atelectasis or scarring.

## 2023-02-02 ENCOUNTER — Emergency Department (HOSPITAL_COMMUNITY)
Admission: EM | Admit: 2023-02-02 | Discharge: 2023-02-02 | Disposition: A | Discharge status: Skilled Nursing Facility | Payer: Medicare Other | Attending: Emergency Medicine | Admitting: Emergency Medicine

## 2023-02-02 ENCOUNTER — Emergency Department (HOSPITAL_COMMUNITY): Payer: Medicare Other

## 2023-02-02 ENCOUNTER — Other Ambulatory Visit: Payer: Self-pay

## 2023-02-02 DIAGNOSIS — F0390 Unspecified dementia without behavioral disturbance: Secondary | ICD-10-CM | POA: Diagnosis not present

## 2023-02-02 DIAGNOSIS — R0989 Other specified symptoms and signs involving the circulatory and respiratory systems: Secondary | ICD-10-CM | POA: Insufficient documentation

## 2023-02-02 NOTE — ED Notes (Signed)
[facility] called at this time. Estimated time is 1 hour.

## 2023-02-02 NOTE — ED Provider Notes (Signed)
[facility] EMERGENCY DEPARTMENT AT [facility] Provider Note   CSN: [id number] Arrival date & time: 02/02/23  4098     History  Chief Complaint  Patient presents with   needed Heimlich    Morgan Weaver is a 89 y.o. female.  HPI Patient presents from nursing facility after reported choking episode requiring Heimlich maneuver. Patient herself has dementia, level 5 caveat.  However, the patient does state that she has no pain, no dyspnea, cannot provide any additional depth of details. Per report the patient was eating a piece of bacon, choked, and after the Heimlich maneuver was applied coughed up a piece of bacon.     Home Medications Prior to Admission medications   Medication Sig Start Date End Date Taking? Authorizing Provider  amLODipine (NORVASC) 5 MG tablet Take 1 tablet (5 mg total) by mouth daily. 04/19/18   Standley Brooking, MD  Cyanocobalamin (VITAMIN B-12 PO) Take 1 tablet by mouth daily.    [provider]  enoxaparin (LOVENOX) 40 MG/0.4ML injection Inject 0.4 mLs (40 mg total) into the skin daily. 30 days total for DVT prophylaxis s/p hip replacement. Last dose 11/30. 04/18/18   Standley Brooking, MD  HYDROcodone-acetaminophen (NORCO/VICODIN) 5-325 MG tablet Take 1-2 tablets by mouth every 6 (six) hours as needed for moderate pain or severe pain (1 tab for moderate pain, 2 for severe). 04/17/18   Standley Brooking, MD  levothyroxine (SYNTHROID, LEVOTHROID) 137 MCG tablet Take 137 mcg by mouth daily before breakfast.  12/15/17   [provider]  nitrofurantoin, macrocrystal-monohydrate, (MACROBID) 100 MG capsule Take 1 capsule (100 mg total) by mouth 2 (two) times daily. 02/06/20   Garlon Hatchet, PA-C  OVER THE COUNTER MEDICATION Take 1 tablet by mouth daily. Vitamins D3    [provider]  sertraline (ZOLOFT) 25 MG tablet Take 1 tablet (25 mg total) by mouth daily. 12/31/17   Anson Fret, MD      Allergies    Ciprofloxacin,  Exelon [rivastigmine tartrate], Aricept [donepezil hcl], Mobic [meloxicam], Namenda [memantine hcl], Tetracyclines & related, Vancomycin, Tape, Ancef [cefazolin], and Sulfa drugs cross reactors    Review of Systems   Review of Systems  Unable to perform ROS: Dementia    Physical Exam Updated Vital Signs BP 133/65 (BP Location: Right Arm)   Pulse 68   Temp 98.4 F (36.9 C) (Oral)   Resp 16   SpO2 98%  Physical Exam Vitals and nursing note reviewed.  Constitutional:      General: She is not in acute distress.    Appearance: She is well-developed.  HENT:     Head: Normocephalic and atraumatic.  Eyes:     Conjunctiva/sclera: Conjunctivae normal.  Cardiovascular:     Rate and Rhythm: Regular rhythm.  Pulmonary:     Effort: Pulmonary effort is normal. No respiratory distress.     Breath sounds: Normal breath sounds. No stridor.  Abdominal:     General: There is no distension.  Skin:    General: Skin is warm and dry.  Neurological:     Mental Status: She is alert.     Cranial Nerves: No cranial nerve deficit.     Motor: Atrophy present.  Psychiatric:        Behavior: Behavior is slowed and withdrawn.        Cognition and Memory: Cognition is impaired. Memory is impaired.     ED Results / Procedures / Treatments   Labs (all labs  ordered are listed, but only abnormal results are displayed) Labs Reviewed - No data to display  EKG None  Radiology DG Abdomen Acute W/Chest  Result Date: 02/02/2023 CLINICAL DATA:  Status post choking on bacon with successful Heimlich maneuver. EXAM: DG ABDOMEN ACUTE WITH 1 VIEW CHEST COMPARISON:  Chest radiograph dated 02/05/2020 FINDINGS: Lines/tubes: None. Chest: Low lung volumes with bronchovascular crowding. Unchanged right mid lung linear atelectasis/scarring. Left basilar patchy opacities. No focal consolidations. No pneumothorax or pleural effusion. Normal heart size. Abdomen: Nonobstructive bowel gas pattern. No pneumatosis or free  air. Large volume stool in the rectum. No abnormal calcification or mass effect. Bones: No acute osseous abnormality. Partially imaged bilateral hip arthroplasties. IMPRESSION: 1. Low lung volumes with left basilar patchy opacities, likely atelectasis. 2. Nonobstructive bowel gas pattern. Large volume stool in the rectum. 3. No pneumothorax or pneumoperitoneum. Electronically Signed   By: Agustin Cree M.D.   On: 02/02/2023 11:45    Procedures Procedures    Medications Ordered in ED Medications - No data to display  ED Course/ Medical Decision Making/ A&P                                 Medical Decision Making Elderly female presents after choking episode, now resolved.  Patient's dementia is somewhat limiting, but she has no complaints, is hemodynamically unremarkable, there is suspicion for successful dislodging of food bolus.  With consideration of esophageal rupture, though this is less likely, x-ray was ordered. This was reviewed, is reassuring, she has been monitored for hours, with no decompensation, no hemodynamic instability, is appropriate for discharge. Cardiac 80 sinus normal Pulse ox 99% room air normal   Amount and/or Complexity of Data Reviewed Independent Historian: EMS    Details: EMS provides history External Data Reviewed: notes. Radiology: ordered and independent interpretation performed. Decision-making details documented in ED Course.  Risk Decision regarding hospitalization. Diagnosis or treatment significantly limited by social determinants of health.   12:26 PM Patient accompanied by her 2 sons.  We have reviewed the patient's presentation, x-ray results, reassuring, no pneumonia, no pneumo peritoneum no pneumothorax. Return precautions discussed, follow-up instructions discussed.  Final Clinical Impression(s) / ED Diagnoses Final diagnoses:  Choking episode    Rx / DC Orders ED Discharge Orders     None         Gerhard Munch, MD 02/02/23  1227

## 2023-02-02 NOTE — ED Triage Notes (Signed)
Pt BIBA from [facility]. Pt was eating breakfast and choked on bacon- staff performed Heimlich maneuver and the bacon came out.  Respirations unlabored   Hx dementia- operating at baseline Aox2

## 2023-02-02 NOTE — Discharge Instructions (Signed)
As discussed, your evaluation today has been largely reassuring.  But, it is important that you monitor your condition carefully, and do not hesitate to return to the ED if you develop new, or concerning changes in your condition. ? ?Otherwise, please follow-up with your physician for appropriate ongoing care. ? ?

## 2023-08-14 DEATH — deceased
